# Patient Record
Sex: Male | Born: 1970 | Hispanic: No | Marital: Married | Smoking: Current every day smoker
Health system: Southern US, Community
[De-identification: ages and names within clinical notes are randomized; demographics above are authoritative.]

## PROBLEM LIST (undated history)

## (undated) DIAGNOSIS — I10 Essential (primary) hypertension: Secondary | ICD-10-CM

## (undated) DIAGNOSIS — E785 Hyperlipidemia, unspecified: Secondary | ICD-10-CM

## (undated) DIAGNOSIS — Z720 Tobacco use: Secondary | ICD-10-CM

## (undated) DIAGNOSIS — F418 Other specified anxiety disorders: Secondary | ICD-10-CM

## (undated) DIAGNOSIS — K219 Gastro-esophageal reflux disease without esophagitis: Secondary | ICD-10-CM

## (undated) HISTORY — PX: ANKLE SURGERY: SHX546

## (undated) HISTORY — PX: KNEE SURGERY: SHX244

## (undated) HISTORY — PX: FEMUR FRACTURE SURGERY: SHX633

## (undated) HISTORY — DX: Essential (primary) hypertension: I10

## (undated) HISTORY — DX: Hyperlipidemia, unspecified: E78.5

## (undated) HISTORY — PX: ROTATOR CUFF REPAIR: SHX139

---

## 2007-11-05 ENCOUNTER — Ambulatory Visit (HOSPITAL_BASED_OUTPATIENT_CLINIC_OR_DEPARTMENT_OTHER): Admission: RE | Admit: 2007-11-05 | Discharge: 2007-11-05 | Payer: Self-pay | Admitting: Specialist

## 2011-03-18 NOTE — Op Note (Signed)
NAME:  Elijah Gillespie, Elijah Gillespie NO.:  [id number]   MEDICAL RECORD NO.:  [id number]          PATIENT TYPE:  AMB   LOCATION:  [facility]                         FACILITY:  [facility]   PHYSICIAN:  Jene Every, M.D.    DATE OF BIRTH:  1971-01-04   DATE OF PROCEDURE:  11/05/2007  DATE OF DISCHARGE:                               OPERATIVE REPORT   PREOPERATIVE DIAGNOSIS:  Post-traumatic chondromalacia patellofemoral  joint.   POSTOPERATIVE DIAGNOSIS:  Post-traumatic chondromalacia patellofemoral  joint.  Loose bodies left knee.   SURGEON:  Jene Every, M.D.   PROCEDURE:  Left knee arthroscopy, evacuation of loose bodies,  chondroplasty of patella, partial synovectomy.   BRIEF HISTORY AND INDICATION:  A 40 year old with knee pain following a  work-related injury, persistent pain, swelling and giving way despite  corticosteroid injections, activity modification and physical therapy.  MRI indicating chondromalacia patella.  He was felt to have post-  traumatic chondromalacia, possible meniscus tear.  Operative  intervention was indicated for diagnosis and treatment.  Risks and  benefits discussed including bleeding, infection, no change in symptoms,  worsening of symptoms, need for repeat debridement, knee replacement,  etc.   PROCEDURE IN DETAIL:  With the patient in supine position after  induction of adequate general anesthesia and 2 grams of Kefzol the left  lower extremity was prepped and draped in the usual sterile fashion.  A  lateral parapatellar portal and a superior and medial parapatellar  portal was fashioned with a #11 blade.  Ingress cannula atraumatically  placed.  Irrigant was utilized to insufflate the joint.  The camera was  then inserted laterally and on direct visualization a medial  parapatellar portal was fashioned with a #11 blade after localization  with 18 gauge needle sparing the medial meniscus.   Notable was loose cartilaginous debris and significant  grade III changes  of patella and some grade IV changes in the central portion of it.  There was normal patellofemoral tracking.  The femoral sulcus was  unremarkable.  The knee was lavaged.  I introduced a 4.2 Kuda shaver  through the medial parapatellar portal, performed a chondroplasty of the  patella.  The gutters were unremarkable.  Medial compartment revealed a  normal, essentially some minor grade II changes of the femoral condyle  and the tibial plateau.  There was some mild fraying of the meniscus.  This was shaved.  The remnant was stable to probe palpation.  I examined  the posterior and the anterior aspect on top and below the meniscus and  with probing there was no displaced fragment amenable to further  surgical intervention.  ACL and PCL was unremarkable.  Lateral  compartment revealed normal femoral condyle and tibial plateau and  meniscus stable to probe palpation without evidence of tear.  Again,  loose cartilaginous components were noted within the medial compartment.  This was lavaged.  The gutters were reexamined.  They are unremarkable.  Again, the lateral meniscus was evaluated and it was stable to probe  palpation without evidence of a tear.   Again, the residual menisci were stable to probe palpation without  evidence  of tearing.  Again, the most significant intraoperative finding  was the grade IV changes of the patella and significant grade III  changes and the loose cartilaginous debris from that.   Next, the knee was copiously lavaged.  All instrumentation was removed.  Portals were closed with 4-0 nylon simple sutures, 0.25% Marcaine with  epinephrine was infiltrated in the joint.  The wound was dressed  sterilely.  He was awakened without difficulty and transported to the  recovery room in satisfactory condition.   The patient tolerated the procedure with no complications.      Jene Every, M.D.  Electronically Signed     JB/MEDQ  D:   11/05/2007  T:  11/05/2007  Job:  [id number]

## 2011-07-24 LAB — POCT HEMOGLOBIN-HEMACUE: Hemoglobin: 15.2

## 2012-11-08 ENCOUNTER — Ambulatory Visit: Payer: Self-pay | Attending: Orthopedic Surgery | Admitting: Physical Therapy

## 2012-11-08 DIAGNOSIS — IMO0001 Reserved for inherently not codable concepts without codable children: Secondary | ICD-10-CM | POA: Insufficient documentation

## 2012-11-08 DIAGNOSIS — M25519 Pain in unspecified shoulder: Secondary | ICD-10-CM | POA: Insufficient documentation

## 2012-11-11 ENCOUNTER — Ambulatory Visit: Payer: Self-pay | Attending: Orthopedic Surgery | Admitting: Physical Therapy

## 2012-11-11 DIAGNOSIS — IMO0001 Reserved for inherently not codable concepts without codable children: Secondary | ICD-10-CM | POA: Insufficient documentation

## 2012-11-11 DIAGNOSIS — M25519 Pain in unspecified shoulder: Secondary | ICD-10-CM | POA: Insufficient documentation

## 2012-11-16 ENCOUNTER — Ambulatory Visit: Payer: Self-pay | Admitting: Physical Therapy

## 2012-11-18 ENCOUNTER — Ambulatory Visit: Payer: Self-pay | Admitting: Physical Therapy

## 2012-11-25 ENCOUNTER — Ambulatory Visit: Payer: Self-pay | Admitting: Physical Therapy

## 2012-11-30 ENCOUNTER — Ambulatory Visit: Payer: No Typology Code available for payment source | Admitting: Physical Therapy

## 2012-12-02 ENCOUNTER — Ambulatory Visit: Payer: Self-pay | Admitting: Physical Therapy

## 2012-12-07 ENCOUNTER — Ambulatory Visit: Payer: No Typology Code available for payment source | Admitting: Physical Therapy

## 2012-12-09 ENCOUNTER — Ambulatory Visit: Payer: Self-pay | Attending: Orthopedic Surgery | Admitting: Physical Therapy

## 2012-12-09 DIAGNOSIS — M25519 Pain in unspecified shoulder: Secondary | ICD-10-CM | POA: Insufficient documentation

## 2012-12-09 DIAGNOSIS — IMO0001 Reserved for inherently not codable concepts without codable children: Secondary | ICD-10-CM | POA: Insufficient documentation

## 2012-12-13 ENCOUNTER — Ambulatory Visit: Payer: Self-pay | Admitting: Physical Therapy

## 2013-01-24 ENCOUNTER — Ambulatory Visit: Payer: No Typology Code available for payment source | Attending: Orthopedic Surgery | Admitting: Physical Therapy

## 2013-07-19 ENCOUNTER — Encounter: Payer: Self-pay | Admitting: Vascular Surgery

## 2013-07-20 ENCOUNTER — Encounter (INDEPENDENT_AMBULATORY_CARE_PROVIDER_SITE_OTHER): Payer: BC Managed Care – PPO | Admitting: Vascular Surgery

## 2013-07-20 ENCOUNTER — Encounter: Payer: Self-pay | Admitting: Vascular Surgery

## 2013-07-20 ENCOUNTER — Ambulatory Visit (INDEPENDENT_AMBULATORY_CARE_PROVIDER_SITE_OTHER): Payer: BC Managed Care – PPO | Admitting: Vascular Surgery

## 2013-07-20 VITALS — BP 112/76 | HR 99 | Ht 75.0 in | Wt 283.4 lb

## 2013-07-20 DIAGNOSIS — I83893 Varicose veins of bilateral lower extremities with other complications: Secondary | ICD-10-CM

## 2013-07-20 DIAGNOSIS — M7989 Other specified soft tissue disorders: Secondary | ICD-10-CM

## 2013-07-20 DIAGNOSIS — M79609 Pain in unspecified limb: Secondary | ICD-10-CM

## 2013-07-20 DIAGNOSIS — I803 Phlebitis and thrombophlebitis of lower extremities, unspecified: Secondary | ICD-10-CM

## 2013-07-20 NOTE — Progress Notes (Signed)
[facility]ist of [facility]  Patient name: Elijah Gillespie MRN: [id number] DOB: [birth year] Sex: male  REASON FOR CONSULT: bilateral lower extremity varicose veins.  HPI: Elijah Gillespie is a 42 y.o. 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 He experiences aching pain, swelling, cramps, and throbbing pain in both lower extremities. His symptoms are worsened when he is standing. He stands at work for 8 hours a day. His symptoms are relieved somewhat with elevation. He has not tried compression stockings before. He is unaware of any previous history of DVT or phlebitis. There is no family history of varicose veins that he is aware of.  Past Medical History  Diagnosis Date  . Hyperlipidemia   . Hypertension    History reviewed. No pertinent family history.  SOCIAL HISTORY: History  Substance Use Topics  . Smoking status: Current Every Day Smoker -- 1.00 packs/day    Types: Cigarettes  . Smokeless tobacco: Never Used  . Alcohol Use: 4.8 oz/week    4 Shots of liquor, 4 Cans of beer per week   No Known Allergies  Current Outpatient Prescriptions  Medication Sig Dispense Refill  . ALPRAZolam (XANAX) 1 MG tablet Take 1 mg by mouth 2 (two) times daily.      . citalopram (CELEXA) 20 MG tablet Take 20 mg by mouth daily.      Marland Kitchen HYDROCHLOROTHIAZIDE PO Take 20 mg by mouth daily.      Marland Kitchen lisinopril (PRINIVIL,ZESTRIL) 20 MG tablet Take 20 mg by mouth daily.      Marland Kitchen omeprazole (PRILOSEC) 20 MG capsule Take 20 mg by mouth 2 (two) times daily.       No current facility-administered medications for this visit.   REVIEW OF SYSTEMS: Arly.Keller ] denotes positive finding; [  ] denotes negative finding  CARDIOVASCULAR:  [ ]  chest pain   [ ]  chest pressure   Arly.Keller ] palpitations   [ ]  orthopnea   Arly.Keller ] dyspnea on exertion   [ ]  claudication   [ ]  rest pain   [ ]  DVT   [ ]  phlebitis PULMONARY:   Arly.Keller ] productive cough   [ ]  asthma   [ ]  wheezing NEUROLOGIC:   [ ]  weakness  [  ] paresthesias  [ ]  aphasia  [ ]  amaurosis  [ ]  dizziness HEMATOLOGIC:   [ ]  bleeding problems   [ ]  clotting disorders MUSCULOSKELETAL:  [ ]  joint pain   [ ]  joint swelling [ ]  leg swelling GASTROINTESTINAL: [ ]   blood in stool  [ ]   hematemesis GENITOURINARY:  [ ]   dysuria  [ ]   hematuria PSYCHIATRIC:  [ ]  history of major depression INTEGUMENTARY:  [ ]  rashes  [ ]  ulcers CONSTITUTIONAL:  [ ]  fever   [ ]  chills  PHYSICAL EXAM: Filed Vitals:   07/20/13 1544  BP: 112/76  Pulse: 99  Height: 6\' 3"  (1.905 m)  Weight: 283 lb 6.4 oz (128.549 kg)  SpO2: 96%   Body mass index is 35.42 kg/(m^2). GENERAL: The patient is a well-nourished male, in no acute distress. The vital signs are documented above. CARDIOVASCULAR: There is a regular rate and rhythm. I do not detect carotid bruits. He has palpable femoral, popliteal, and pedal pulses bilaterally. He has no significant lower extremity swelling. PULMONARY: There is good air exchange bilaterally without wheezing or rales. ABDOMEN: Soft and non-tender with normal pitched bowel sounds.  MUSCULOSKELETAL: There are no major deformities or cyanosis. NEUROLOGIC:  No focal weakness or paresthesias are detected. SKIN: He has some truncal varicosities especially along the medial aspect of his right leg. He has smaller varicosities along the medial aspect of his left leg. There is no significant hyperpigmentation. PSYCHIATRIC: The patient has a normal affect.  DATA:  I have independently interpreted his venous duplex scan. On the left side, he has no evidence of DVT. He does have deep vein reflux in the superficial femoral vein and popliteal vein. He also has reflux in the left greater saphenous vein.  On the right side, he has no evidence of DVT. He does have reflux in the right femoral vein, superficial femoral vein, and popliteal vein. In addition he has reflux in the right greater saphenous vein.  MEDICAL ISSUES: This patient has painful varicose  veins of both lower extremities. He has evidence of deep and superficial venous reflux. We have discussed the importance of intermittent leg elevation and the proper positioning for this. In addition we have discussed the use of compression stockings and I have written him a prescription for 8 knee-high compression stockings with a gradient of 15-20 mm Hg. I had suggested using a thigh-high stocking with a gradient of 20-30 mm of mercury, however, the patient did not want to wear those. I also discussed the option of considering laser ablation of the greater saphenous veins however again he is not interested at this time. I've explained that we'll be happy to see him back at any time if his symptoms progress. In addition I discussed with him the importance of tobacco cessation. I have given him the number for [employer] quit now program.   Vendetta Pittinger S [facility] [employer]: [phone]

## 2017-08-23 ENCOUNTER — Emergency Department (HOSPITAL_BASED_OUTPATIENT_CLINIC_OR_DEPARTMENT_OTHER): Payer: Self-pay

## 2017-08-23 ENCOUNTER — Encounter (HOSPITAL_BASED_OUTPATIENT_CLINIC_OR_DEPARTMENT_OTHER): Payer: Self-pay | Admitting: Emergency Medicine

## 2017-08-23 ENCOUNTER — Emergency Department (HOSPITAL_BASED_OUTPATIENT_CLINIC_OR_DEPARTMENT_OTHER)
Admission: EM | Admit: 2017-08-23 | Discharge: 2017-08-23 | Disposition: A | Discharge status: Home / Self Care | Payer: Self-pay | Attending: Emergency Medicine | Admitting: Emergency Medicine

## 2017-08-23 DIAGNOSIS — W231XXA Caught, crushed, jammed, or pinched between stationary objects, initial encounter: Secondary | ICD-10-CM | POA: Insufficient documentation

## 2017-08-23 DIAGNOSIS — I10 Essential (primary) hypertension: Secondary | ICD-10-CM | POA: Insufficient documentation

## 2017-08-23 DIAGNOSIS — R0781 Pleurodynia: Secondary | ICD-10-CM

## 2017-08-23 DIAGNOSIS — Y929 Unspecified place or not applicable: Secondary | ICD-10-CM | POA: Insufficient documentation

## 2017-08-23 DIAGNOSIS — R0789 Other chest pain: Secondary | ICD-10-CM | POA: Insufficient documentation

## 2017-08-23 DIAGNOSIS — F17210 Nicotine dependence, cigarettes, uncomplicated: Secondary | ICD-10-CM | POA: Insufficient documentation

## 2017-08-23 DIAGNOSIS — Y999 Unspecified external cause status: Secondary | ICD-10-CM | POA: Insufficient documentation

## 2017-08-23 DIAGNOSIS — S2221XA Fracture of manubrium, initial encounter for closed fracture: Secondary | ICD-10-CM | POA: Insufficient documentation

## 2017-08-23 DIAGNOSIS — Y939 Activity, unspecified: Secondary | ICD-10-CM | POA: Insufficient documentation

## 2017-08-23 LAB — BASIC METABOLIC PANEL
Anion gap: 8 (ref 5–15)
BUN: 13 mg/dL (ref 6–20)
CHLORIDE: 103 mmol/L (ref 101–111)
CO2: 26 mmol/L (ref 22–32)
Calcium: 9.1 mg/dL (ref 8.9–10.3)
Creatinine, Ser: 0.97 mg/dL (ref 0.61–1.24)
GFR calc Af Amer: >60 mL/min (ref >60)
GFR calc non Af Amer: >60 mL/min (ref >60)
GLUCOSE: 91 mg/dL (ref 65–99)
POTASSIUM: 3.9 mmol/L (ref 3.5–5.1)
Sodium: 137 mmol/L (ref 135–145)

## 2017-08-23 MED ORDER — DIAZEPAM 5 MG PO TABS
5.0000 mg | ORAL_TABLET | Freq: Two times a day (BID) | ORAL | 0 refills | Status: DC
Start: 2017-08-23 — End: 2017-11-09

## 2017-08-23 MED ORDER — HYDROMORPHONE HCL 1 MG/ML IJ SOLN
1.0000 mg | Freq: Once | INTRAMUSCULAR | Status: AC
  Administered 2017-08-23: 1 mg via INTRAVENOUS
  Filled 2017-08-23: qty 1

## 2017-08-23 MED ORDER — MORPHINE SULFATE (PF) 4 MG/ML IV SOLN
4.0000 mg | Freq: Once | INTRAVENOUS | Status: AC
  Administered 2017-08-23: 4 mg via INTRAVENOUS
  Filled 2017-08-23: qty 1

## 2017-08-23 MED ORDER — IOPAMIDOL (ISOVUE-300) INJECTION 61%
100.0000 mL | Freq: Once | INTRAVENOUS | Status: AC | PRN
  Administered 2017-08-23: 80 mL via INTRAVENOUS

## 2017-08-23 NOTE — ED Triage Notes (Signed)
PT presents with c/o left side of trunk pain since Friday he was changing oil in car and jack moved . PT went to other hospital and given motrin and muscle relaxer but still has pain. PT also has xrays at other hospital.

## 2017-08-23 NOTE — ED Provider Notes (Signed)
[facility] EMERGENCY DEPARTMENT Provider Note   CSN: [id number] Arrival date & time: 08/23/17  1511     History   Chief Complaint Chief Complaint  Patient presents with  . Flank Pain    HPI Elijah Gillespie is a 46 y.o. male.  HPI   46 year old male presents today with complaints of chest wall and back pain.  Patient notes that 2 days ago he was changing oil on a vehicle, the Ree Kida moved in the vehicle pinched him up against the wall.  He notes that struck him on the left upper extremity.  Patient notes at that time he was having left anterior chest wall and clavicular pain, and left posterior rib pain.  Patient notes no shortness of breath or productive cough, he does note pain with deep inspiration.  He denies any other injuries, denies patient was seen in the emergency room after the incident, he had plain films ordered with no significant findings, was given muscle relaxers and discharge.  He notes the muscle relaxers have not provided significant improvement in his symptoms.  He notes that he is prescribed Percocet as needed for chronic back pain, has been using this without significant improvement as well.   Past Medical History:  Diagnosis Date  . Hyperlipidemia   . Hypertension     Patient Active Problem List   Diagnosis Date Noted  . Pain in limb 07/20/2013  . Swelling of limb 07/20/2013  . Phlebitis and thrombophlebitis of lower extremities, unspecified 07/20/2013    History reviewed. No pertinent surgical history.     Home Medications    Prior to Admission medications   Medication Sig Start Date End Date Taking? Authorizing Provider  ALPRAZolam Prudy Feeler) 1 MG tablet Take 1 mg by mouth 2 (two) times daily.    [provider]  citalopram (CELEXA) 20 MG tablet Take 20 mg by mouth daily.    [provider]  diazepam (VALIUM) 5 MG tablet Take 1 tablet (5 mg total) by mouth 2 (two) times daily. 08/23/17   Damiyah Ditmars, Tinnie Gens, PA-C    HYDROCHLOROTHIAZIDE PO Take 20 mg by mouth daily.    [provider]  lisinopril (PRINIVIL,ZESTRIL) 20 MG tablet Take 20 mg by mouth daily.    [provider]  omeprazole (PRILOSEC) 20 MG capsule Take 20 mg by mouth 2 (two) times daily.    [provider]    Family History No family history on file.  Social History Social History  Substance Use Topics  . Smoking status: Current Every Day Smoker    Packs/day: 1.00    Types: Cigarettes  . Smokeless tobacco: Never Used  . Alcohol use 4.8 oz/week    4 Shots of liquor, 4 Cans of beer per week     Allergies   Patient has no known allergies.   Review of Systems Review of Systems  All other systems reviewed and are negative.    Physical Exam Updated Vital Signs BP (!) 142/102   Pulse (!) 116   Temp 98.1 F (36.7 C) (Oral)   Resp 20   SpO2 96%   Physical Exam  Constitutional: He is oriented to person, place, and time. He appears well-developed and well-nourished.  HENT:  Head: Normocephalic and atraumatic.  Eyes: Pupils are equal, round, and reactive to light. Conjunctivae are normal. Right eye exhibits no discharge. Left eye exhibits no discharge. No scleral icterus.  Neck: Normal range of motion. No JVD present. No tracheal deviation present.  Pulmonary/Chest: Effort  normal. No stridor. No respiratory distress. He has no wheezes. He has no rales. He exhibits tenderness.  TTP of lateral anterior chest wall and sternum- TTP of left posterior ribs, no crepitus- minor swelling noted over medial superior pectoralis   Abdominal: Soft. He exhibits no distension and no mass. There is no tenderness. There is no rebound and no guarding. No hernia.  Neurological: He is alert and oriented to person, place, and time. Coordination normal.  Psychiatric: He has a normal mood and affect. His behavior is normal. Judgment and thought content normal.  Nursing note and vitals reviewed.    ED Treatments / Results   Labs (all labs ordered are listed, but only abnormal results are displayed) Labs Reviewed  BASIC METABOLIC PANEL    EKG  EKG Interpretation None       Radiology Ct Chest W Contrast  Result Date: 08/23/2017 CLINICAL DATA:  Chest trauma, crushing injury EXAM: CT CHEST WITH CONTRAST TECHNIQUE: Multidetector CT imaging of the chest was performed during intravenous contrast administration. CONTRAST:  80mL ISOVUE-300 IOPAMIDOL (ISOVUE-300) INJECTION 61% COMPARISON:  Chest two-view 08/21/2017 FINDINGS: Cardiovascular: Thoracic aorta normal in caliber. Pulmonary artery is normal. Heart size normal. Possible minimal pericardial effusion. Stranding in the pericardial fat anteriorly on the right may be due to contusion. Mediastinum/Nodes: Large hiatal hernia. Large mass involving the right thyroid measuring 31 x 46 mm. Left lobe of the thyroid normal Lungs/Pleura: Bibasilar atelectasis, right greater than left. Negative for pleural effusion or pneumothorax 10 mm irregular nodule left upper lobe posteriorly. No other lung nodules. Upper Abdomen: Negative Musculoskeletal: Fracture of the superior manubrium on the left extending into the sternoclavicular joint. No rib fracture. Negative for thoracic spine fracture. IMPRESSION: Fracture the superior manubrium on the left extending into the sternoclavicular joint. Possible small pericardial effusion. Mild stranding in the epicardial fat on the right possibly due to contusion. 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 Recommend thyroid ultrasound and possible biopsy to exclude neoplasm Bibasilar atelectasis 10 mm nodule superior segment left upper lobe Consider one of the following in 3 months for both low-risk and high-risk individuals: (a) repeat chest CT, (b) follow-up PET-CT, or (c) tissue sampling. This recommendation follows the consensus statement: Guidelines for Management of Incidental Pulmonary Nodules Detected on CT Images: From the Fleischner  Society 2017; Radiology 2017; 284:228-243. Electronically Signed   By: Marlan Palauharles  Clark M.D.   On: 08/23/2017 17:09    Procedures Procedures (including critical care time)  Medications Ordered in ED Medications  morphine 4 MG/ML injection 4 mg (4 mg Intravenous Given 08/23/17 1615)  iopamidol (ISOVUE-300) 61 % injection 100 mL (80 mLs Intravenous Contrast Given 08/23/17 1623)  HYDROmorphone (DILAUDID) injection 1 mg (1 mg Intravenous Given 08/23/17 1731)     Initial Impression / Assessment and Plan / ED Course  I have reviewed the triage vital signs and the nursing notes.  Pertinent labs & imaging results that were available during my care of the patient were reviewed by me and considered in my medical decision making (see chart for details).     Final Clinical Impressions(s) / ED Diagnoses   Final diagnoses:  Chest wall pain  Rib pain  Closed fracture of manubrium, initial encounter    Labs: I-STAT Chem-8  Imaging: CT chest with contrast  Consults:  Therapeutics: Feeding  Discharge Meds: valium  Assessment/Plan: 46 year old male presents today with chest trauma.  He has no signs of bruising, no acute respiratory difficulties.  Patient does have tenderness  to palpation.  Due to ongoing discomfort and mechanism of injury CT chest with contrast will be ordered to further assess patient.  He will be given pain medication here.   CT scan showed manubrium fracture-also showed potential small pericardial effusion, mild stranding in the epicardial fat on the right side possibly due to contusion.  Patient has no adventitious heart sounds or lung sounds. Slightly tachycardic likely secondary to pain.  Trauma surgery was consulted who agreed that this far out is unlikely for any further findings to appear and that patient would be okay for outpatient follow-up.  EKG will be performed here, patient will be given another dose of pain medicine and likely discharged home.  Patient reports  having muscle spasms noting that muscle relaxers given prior did not improve his symptoms.  Short course of Valium will be given, a counseled both the patient and his wife extensively on use of Valium in combination with narcotic pain medication and/or alcohol, they will avoid using these together.  Patient also had several other incidental findings on a CT scan including large thyroid mass all results were read and given to the patient as this is very important he follows up for ultrasound and biopsy and repeat CT scan of his chest.  He verbalized that he would follow up on these findings.  Patient was given strict return precautions, he had no further questions or concerns at time discharge.  New Prescriptions New Prescriptions   DIAZEPAM (VALIUM) 5 MG TABLET    Take 1 tablet (5 mg total) by mouth 2 (two) times daily.     Eyvonne Mechanic, PA-C 08/23/17 1756    Nira Conn, MD 08/24/17 0001

## 2017-08-23 NOTE — Discharge Instructions (Signed)
Please read attached information.  Please use medication only as directed.  Do not use Valium and narcotic pain medication together as these can cause respiratory depression and can be fatal.  Please follow-up with your primary care provider for reassessment, return to the emergency room immediately if you develop any new or worsening signs or symptoms.  Please follow-up with your primary care about CT scan findings including thyroid mass and pulmonary nodule he will need follow-up imaging.

## 2017-09-20 ENCOUNTER — Emergency Department (HOSPITAL_BASED_OUTPATIENT_CLINIC_OR_DEPARTMENT_OTHER)
Admission: EM | Admit: 2017-09-20 | Discharge: 2017-09-21 | Disposition: A | Discharge status: Home / Self Care | Payer: Medicaid Other | Attending: Emergency Medicine | Admitting: Emergency Medicine

## 2017-09-20 ENCOUNTER — Other Ambulatory Visit: Payer: Self-pay

## 2017-09-20 ENCOUNTER — Encounter (HOSPITAL_BASED_OUTPATIENT_CLINIC_OR_DEPARTMENT_OTHER): Payer: Self-pay | Admitting: *Deleted

## 2017-09-20 ENCOUNTER — Emergency Department (HOSPITAL_BASED_OUTPATIENT_CLINIC_OR_DEPARTMENT_OTHER): Payer: Medicaid Other

## 2017-09-20 DIAGNOSIS — R062 Wheezing: Secondary | ICD-10-CM | POA: Diagnosis not present

## 2017-09-20 DIAGNOSIS — Z79899 Other long term (current) drug therapy: Secondary | ICD-10-CM | POA: Diagnosis not present

## 2017-09-20 DIAGNOSIS — I10 Essential (primary) hypertension: Secondary | ICD-10-CM | POA: Insufficient documentation

## 2017-09-20 DIAGNOSIS — R0789 Other chest pain: Secondary | ICD-10-CM | POA: Diagnosis not present

## 2017-09-20 DIAGNOSIS — F17210 Nicotine dependence, cigarettes, uncomplicated: Secondary | ICD-10-CM | POA: Insufficient documentation

## 2017-09-20 DIAGNOSIS — R079 Chest pain, unspecified: Secondary | ICD-10-CM | POA: Diagnosis present

## 2017-09-20 MED ORDER — IPRATROPIUM-ALBUTEROL 0.5-2.5 (3) MG/3ML IN SOLN
3.0000 mL | Freq: Once | RESPIRATORY_TRACT | Status: AC
  Administered 2017-09-20: 3 mL via RESPIRATORY_TRACT
  Filled 2017-09-20: qty 3

## 2017-09-20 MED ORDER — OXYCODONE-ACETAMINOPHEN 5-325 MG PO TABS
2.0000 | ORAL_TABLET | Freq: Once | ORAL | Status: AC
  Administered 2017-09-21: 2 via ORAL
  Filled 2017-09-20: qty 2

## 2017-09-20 NOTE — ED Notes (Signed)
EDP into room 

## 2017-09-20 NOTE — ED Triage Notes (Signed)
Pt seen here for chest injury in October. States over the 2 weeks he has been having pain in his mid chest and under left axilla. C/o feeling sob with walking short distances- states chest is "burning and aching"

## 2017-09-20 NOTE — ED Notes (Addendum)
Alert, NAD, calm, interactive, resps e/u, speaking in clear complete sentences, no dyspnea noted, skin W&D, VSS, c/o semi-productive cough, congestion, lungs burning, sob/DOE, ongoing for ~ 2 weeks, (denies: fever, sore throat, ear or eye sx, NVD, bleeding). [facility]  L axillary ribs hurt to cough and take deep breath ("in the area of injury ~4 weeks ago"). Tonsils swollen, PND noted.
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
## 2017-09-20 NOTE — ED Notes (Signed)
RT at BS, neb in progress.  

## 2017-09-20 NOTE — ED Notes (Signed)
Ambulatory back from xray, no changes, alert, NAD, calm, interactive. No increased dyspnea.

## 2017-09-21 MED ORDER — ALBUTEROL SULFATE HFA 108 (90 BASE) MCG/ACT IN AERS
2.0000 | INHALATION_SPRAY | Freq: Once | RESPIRATORY_TRACT | Status: AC
  Administered 2017-09-21: 2 via RESPIRATORY_TRACT
  Filled 2017-09-21: qty 6.7

## 2017-09-21 MED ORDER — NAPROXEN 500 MG PO TABS
500.0000 mg | ORAL_TABLET | Freq: Two times a day (BID) | ORAL | 0 refills | Status: DC
Start: 2017-09-21 — End: 2017-11-09

## 2017-09-21 MED ORDER — KETOROLAC TROMETHAMINE 30 MG/ML IJ SOLN
30.0000 mg | Freq: Once | INTRAMUSCULAR | Status: AC
Start: 2017-09-21 — End: 2017-09-21
  Administered 2017-09-21: 30 mg via INTRAMUSCULAR
  Filled 2017-09-21: qty 1

## 2017-09-21 NOTE — ED Notes (Signed)
EDP into room to discuss results and d/c plan

## 2017-09-21 NOTE — Discharge Instructions (Signed)
Seen today for chest wall pain.  This is likely related to your known injury.  This can take 6-8 weeks to heal.  Make sure to use your incentive spirometer 4 times a day.  Avoid smoking.  You will be given an inhaler.

## 2017-09-21 NOTE — ED Notes (Signed)
No changes, updated, VSS. Resting, alert, NAD, calm, interactive. No dyspnea noted at rest.

## 2017-10-27 ENCOUNTER — Other Ambulatory Visit: Payer: Self-pay

## 2017-10-27 ENCOUNTER — Emergency Department (HOSPITAL_BASED_OUTPATIENT_CLINIC_OR_DEPARTMENT_OTHER): Payer: Medicaid Other

## 2017-10-27 ENCOUNTER — Encounter (HOSPITAL_BASED_OUTPATIENT_CLINIC_OR_DEPARTMENT_OTHER): Payer: Self-pay | Admitting: Adult Health

## 2017-10-27 ENCOUNTER — Inpatient Hospital Stay (HOSPITAL_BASED_OUTPATIENT_CLINIC_OR_DEPARTMENT_OTHER)
Admission: EM | Admit: 2017-10-27 | Discharge: 2017-11-09 | DRG: 853 | Disposition: A | Discharge status: Home / Self Care | Payer: Medicaid Other | Attending: Internal Medicine | Admitting: Internal Medicine

## 2017-10-27 DIAGNOSIS — Z4659 Encounter for fitting and adjustment of other gastrointestinal appliance and device: Secondary | ICD-10-CM

## 2017-10-27 DIAGNOSIS — Z09 Encounter for follow-up examination after completed treatment for conditions other than malignant neoplasm: Secondary | ICD-10-CM

## 2017-10-27 DIAGNOSIS — Z716 Tobacco abuse counseling: Secondary | ICD-10-CM

## 2017-10-27 DIAGNOSIS — J90 Pleural effusion, not elsewhere classified: Secondary | ICD-10-CM

## 2017-10-27 DIAGNOSIS — Z01811 Encounter for preprocedural respiratory examination: Secondary | ICD-10-CM

## 2017-10-27 DIAGNOSIS — I10 Essential (primary) hypertension: Secondary | ICD-10-CM

## 2017-10-27 DIAGNOSIS — Z720 Tobacco use: Secondary | ICD-10-CM | POA: Diagnosis present

## 2017-10-27 DIAGNOSIS — R6520 Severe sepsis without septic shock: Secondary | ICD-10-CM | POA: Diagnosis present

## 2017-10-27 DIAGNOSIS — R000 Tachycardia, unspecified: Secondary | ICD-10-CM

## 2017-10-27 DIAGNOSIS — Z79899 Other long term (current) drug therapy: Secondary | ICD-10-CM

## 2017-10-27 DIAGNOSIS — Z833 Family history of diabetes mellitus: Secondary | ICD-10-CM

## 2017-10-27 DIAGNOSIS — J939 Pneumothorax, unspecified: Secondary | ICD-10-CM

## 2017-10-27 DIAGNOSIS — E876 Hypokalemia: Secondary | ICD-10-CM | POA: Diagnosis present

## 2017-10-27 DIAGNOSIS — Z8672 Personal history of thrombophlebitis: Secondary | ICD-10-CM

## 2017-10-27 DIAGNOSIS — J189 Pneumonia, unspecified organism: Secondary | ICD-10-CM

## 2017-10-27 DIAGNOSIS — A419 Sepsis, unspecified organism: Secondary | ICD-10-CM

## 2017-10-27 DIAGNOSIS — F418 Other specified anxiety disorders: Secondary | ICD-10-CM | POA: Diagnosis present

## 2017-10-27 DIAGNOSIS — Z803 Family history of malignant neoplasm of breast: Secondary | ICD-10-CM

## 2017-10-27 DIAGNOSIS — Z9289 Personal history of other medical treatment: Secondary | ICD-10-CM

## 2017-10-27 DIAGNOSIS — M62838 Other muscle spasm: Secondary | ICD-10-CM | POA: Diagnosis present

## 2017-10-27 DIAGNOSIS — J9811 Atelectasis: Secondary | ICD-10-CM

## 2017-10-27 DIAGNOSIS — E785 Hyperlipidemia, unspecified: Secondary | ICD-10-CM

## 2017-10-27 DIAGNOSIS — Z9889 Other specified postprocedural states: Secondary | ICD-10-CM

## 2017-10-27 DIAGNOSIS — D638 Anemia in other chronic diseases classified elsewhere: Secondary | ICD-10-CM | POA: Diagnosis present

## 2017-10-27 DIAGNOSIS — D62 Acute posthemorrhagic anemia: Secondary | ICD-10-CM | POA: Diagnosis not present

## 2017-10-27 DIAGNOSIS — Z938 Other artificial opening status: Secondary | ICD-10-CM

## 2017-10-27 DIAGNOSIS — J9601 Acute respiratory failure with hypoxia: Secondary | ICD-10-CM | POA: Diagnosis present

## 2017-10-27 DIAGNOSIS — Z9689 Presence of other specified functional implants: Secondary | ICD-10-CM

## 2017-10-27 DIAGNOSIS — R739 Hyperglycemia, unspecified: Secondary | ICD-10-CM | POA: Diagnosis not present

## 2017-10-27 DIAGNOSIS — J181 Lobar pneumonia, unspecified organism: Secondary | ICD-10-CM

## 2017-10-27 DIAGNOSIS — A408 Other streptococcal sepsis: Principal | ICD-10-CM | POA: Diagnosis present

## 2017-10-27 DIAGNOSIS — E041 Nontoxic single thyroid nodule: Secondary | ICD-10-CM

## 2017-10-27 DIAGNOSIS — F17210 Nicotine dependence, cigarettes, uncomplicated: Secondary | ICD-10-CM | POA: Diagnosis present

## 2017-10-27 DIAGNOSIS — J869 Pyothorax without fistula: Secondary | ICD-10-CM

## 2017-10-27 DIAGNOSIS — Z6835 Body mass index (BMI) 35.0-35.9, adult: Secondary | ICD-10-CM

## 2017-10-27 DIAGNOSIS — B9789 Other viral agents as the cause of diseases classified elsewhere: Secondary | ICD-10-CM | POA: Diagnosis present

## 2017-10-27 DIAGNOSIS — K219 Gastro-esophageal reflux disease without esophagitis: Secondary | ICD-10-CM

## 2017-10-27 HISTORY — DX: Gastro-esophageal reflux disease without esophagitis: K21.9

## 2017-10-27 HISTORY — DX: Tobacco use: Z72.0

## 2017-10-27 HISTORY — DX: Other specified anxiety disorders: F41.8

## 2017-10-27 LAB — BASIC METABOLIC PANEL
ANION GAP: 9 (ref 5–15)
BUN: 11 mg/dL (ref 6–20)
CALCIUM: 8.9 mg/dL (ref 8.9–10.3)
CO2: 29 mmol/L (ref 22–32)
CREATININE: 0.96 mg/dL (ref 0.61–1.24)
Chloride: 101 mmol/L (ref 101–111)
Glucose, Bld: 148 mg/dL — ABNORMAL HIGH (ref 65–99)
Potassium: 3.8 mmol/L (ref 3.5–5.1)
SODIUM: 139 mmol/L (ref 135–145)

## 2017-10-27 LAB — I-STAT CG4 LACTIC ACID, ED: LACTIC ACID, VENOUS: 1.67 mmol/L (ref 0.5–1.9)

## 2017-10-27 NOTE — ED Notes (Signed)
Pt given swabs for dry mouth; requesting something to drink; told he could not have anything to eat/drink until further notice.

## 2017-10-27 NOTE — ED Triage Notes (Signed)
PResents with SOB that began 2 weeks ago and has been intermittent.  HE endorses left side pain Endorses cough, coughing up yellowish brown. He has been seen here a few times in the past three months for similar episodes. HE has been using incentive spirometer at home without being able to raise the meter as high as he was able to before today. PT is tachypneic and tachycardic. Diminished on left with rales.

## 2017-10-27 NOTE — ED Notes (Signed)
Family at bedside. 

## 2017-10-27 NOTE — ED Notes (Signed)
ED Provider at bedside. 

## 2017-10-28 ENCOUNTER — Emergency Department (HOSPITAL_BASED_OUTPATIENT_CLINIC_OR_DEPARTMENT_OTHER): Payer: Medicaid Other

## 2017-10-28 ENCOUNTER — Inpatient Hospital Stay (HOSPITAL_COMMUNITY): Payer: Medicaid Other

## 2017-10-28 ENCOUNTER — Encounter (HOSPITAL_BASED_OUTPATIENT_CLINIC_OR_DEPARTMENT_OTHER): Payer: Self-pay | Admitting: Internal Medicine

## 2017-10-28 ENCOUNTER — Other Ambulatory Visit: Payer: Self-pay

## 2017-10-28 DIAGNOSIS — F17210 Nicotine dependence, cigarettes, uncomplicated: Secondary | ICD-10-CM | POA: Diagnosis present

## 2017-10-28 DIAGNOSIS — Z833 Family history of diabetes mellitus: Secondary | ICD-10-CM | POA: Diagnosis not present

## 2017-10-28 DIAGNOSIS — A419 Sepsis, unspecified organism: Secondary | ICD-10-CM | POA: Diagnosis not present

## 2017-10-28 DIAGNOSIS — R000 Tachycardia, unspecified: Secondary | ICD-10-CM | POA: Diagnosis present

## 2017-10-28 DIAGNOSIS — D638 Anemia in other chronic diseases classified elsewhere: Secondary | ICD-10-CM | POA: Diagnosis present

## 2017-10-28 DIAGNOSIS — K219 Gastro-esophageal reflux disease without esophagitis: Secondary | ICD-10-CM | POA: Diagnosis present

## 2017-10-28 DIAGNOSIS — Z720 Tobacco use: Secondary | ICD-10-CM | POA: Diagnosis not present

## 2017-10-28 DIAGNOSIS — J90 Pleural effusion, not elsewhere classified: Secondary | ICD-10-CM

## 2017-10-28 DIAGNOSIS — J9601 Acute respiratory failure with hypoxia: Secondary | ICD-10-CM

## 2017-10-28 DIAGNOSIS — E785 Hyperlipidemia, unspecified: Secondary | ICD-10-CM

## 2017-10-28 DIAGNOSIS — Z803 Family history of malignant neoplasm of breast: Secondary | ICD-10-CM | POA: Diagnosis not present

## 2017-10-28 DIAGNOSIS — Z8672 Personal history of thrombophlebitis: Secondary | ICD-10-CM | POA: Diagnosis not present

## 2017-10-28 DIAGNOSIS — E041 Nontoxic single thyroid nodule: Secondary | ICD-10-CM

## 2017-10-28 DIAGNOSIS — R0602 Shortness of breath: Secondary | ICD-10-CM | POA: Diagnosis present

## 2017-10-28 DIAGNOSIS — I10 Essential (primary) hypertension: Secondary | ICD-10-CM

## 2017-10-28 DIAGNOSIS — Z938 Other artificial opening status: Secondary | ICD-10-CM | POA: Diagnosis not present

## 2017-10-28 DIAGNOSIS — B9789 Other viral agents as the cause of diseases classified elsewhere: Secondary | ICD-10-CM | POA: Diagnosis present

## 2017-10-28 DIAGNOSIS — Z716 Tobacco abuse counseling: Secondary | ICD-10-CM | POA: Diagnosis not present

## 2017-10-28 DIAGNOSIS — D62 Acute posthemorrhagic anemia: Secondary | ICD-10-CM | POA: Diagnosis not present

## 2017-10-28 DIAGNOSIS — Z6835 Body mass index (BMI) 35.0-35.9, adult: Secondary | ICD-10-CM | POA: Diagnosis not present

## 2017-10-28 DIAGNOSIS — J869 Pyothorax without fistula: Secondary | ICD-10-CM | POA: Diagnosis present

## 2017-10-28 DIAGNOSIS — E7849 Other hyperlipidemia: Secondary | ICD-10-CM | POA: Diagnosis not present

## 2017-10-28 DIAGNOSIS — A408 Other streptococcal sepsis: Secondary | ICD-10-CM | POA: Diagnosis present

## 2017-10-28 DIAGNOSIS — J181 Lobar pneumonia, unspecified organism: Secondary | ICD-10-CM | POA: Diagnosis present

## 2017-10-28 DIAGNOSIS — E876 Hypokalemia: Secondary | ICD-10-CM | POA: Diagnosis present

## 2017-10-28 DIAGNOSIS — J189 Pneumonia, unspecified organism: Secondary | ICD-10-CM | POA: Diagnosis not present

## 2017-10-28 DIAGNOSIS — R6520 Severe sepsis without septic shock: Secondary | ICD-10-CM | POA: Diagnosis present

## 2017-10-28 DIAGNOSIS — F418 Other specified anxiety disorders: Secondary | ICD-10-CM | POA: Diagnosis present

## 2017-10-28 DIAGNOSIS — J9811 Atelectasis: Secondary | ICD-10-CM | POA: Diagnosis present

## 2017-10-28 DIAGNOSIS — A491 Streptococcal infection, unspecified site: Secondary | ICD-10-CM | POA: Diagnosis not present

## 2017-10-28 DIAGNOSIS — R739 Hyperglycemia, unspecified: Secondary | ICD-10-CM | POA: Diagnosis not present

## 2017-10-28 HISTORY — PX: IR THORACENTESIS ASP PLEURAL SPACE W/IMG GUIDE: IMG5380

## 2017-10-28 LAB — GRAM STAIN

## 2017-10-28 LAB — CBC
HCT: 36.8 % — ABNORMAL LOW (ref 39.0–52.0)
Hemoglobin: 11.6 g/dL — ABNORMAL LOW (ref 13.0–17.0)
MCH: 27.4 pg (ref 26.0–34.0)
MCHC: 31.5 g/dL (ref 30.0–36.0)
MCV: 86.8 fL (ref 78.0–100.0)
Platelets: 359 10*3/uL (ref 150–400)
RBC: 4.24 MIL/uL (ref 4.22–5.81)
RDW: 15.2 % (ref 11.5–15.5)
WBC: 35.9 10*3/uL — ABNORMAL HIGH (ref 4.0–10.5)

## 2017-10-28 LAB — RESPIRATORY PANEL BY PCR
Adenovirus: NOT DETECTED
BORDETELLA PERTUSSIS-RVPCR: NOT DETECTED
CORONAVIRUS 229E-RVPPCR: NOT DETECTED
Chlamydophila pneumoniae: NOT DETECTED
Coronavirus HKU1: NOT DETECTED
Coronavirus NL63: NOT DETECTED
Coronavirus OC43: NOT DETECTED
INFLUENZA B-RVPPCR: NOT DETECTED
Influenza A: NOT DETECTED
METAPNEUMOVIRUS-RVPPCR: NOT DETECTED
MYCOPLASMA PNEUMONIAE-RVPPCR: NOT DETECTED
PARAINFLUENZA VIRUS 2-RVPPCR: NOT DETECTED
Parainfluenza Virus 1: NOT DETECTED
Parainfluenza Virus 3: NOT DETECTED
Parainfluenza Virus 4: NOT DETECTED
RESPIRATORY SYNCYTIAL VIRUS-RVPPCR: NOT DETECTED
Rhinovirus / Enterovirus: NOT DETECTED

## 2017-10-28 LAB — BODY FLUID CELL COUNT WITH DIFFERENTIAL
EOS FL: 0 %
Lymphs, Fluid: 8 %
Monocyte-Macrophage-Serous Fluid: 4 % — ABNORMAL LOW (ref 50–90)
Neutrophil Count, Fluid: 88 % — ABNORMAL HIGH (ref 0–25)
Total Nucleated Cell Count, Fluid: 4950 cu mm — ABNORMAL HIGH (ref 0–1000)

## 2017-10-28 LAB — CBC WITH DIFFERENTIAL/PLATELET
BASOS ABS: 0 10*3/uL (ref 0.0–0.1)
BASOS PCT: 0 %
Basophils Absolute: 0 10*3/uL (ref 0.0–0.1)
Basophils Relative: 0 %
EOS ABS: 0.3 10*3/uL (ref 0.0–0.7)
EOS ABS: 0 10*3/uL (ref 0.0–0.7)
Eosinophils Relative: 2 %
Eosinophils Relative: 0 %
HCT: 40.2 % (ref 39.0–52.0)
HCT: 31.7 % — ABNORMAL LOW (ref 39.0–52.0)
HEMOGLOBIN: 10.1 g/dL — AB (ref 13.0–17.0)
Hemoglobin: 12.6 g/dL — ABNORMAL LOW (ref 13.0–17.0)
LYMPHS PCT: 14 %
Lymphocytes Relative: 4 %
Lymphs Abs: 2.4 10*3/uL (ref 0.7–4.0)
Lymphs Abs: 1 10*3/uL (ref 0.7–4.0)
MCH: 27 pg (ref 26.0–34.0)
MCH: 27.2 pg (ref 26.0–34.0)
MCHC: 31.3 g/dL (ref 30.0–36.0)
MCHC: 31.9 g/dL (ref 30.0–36.0)
MCV: 86.3 fL (ref 78.0–100.0)
MCV: 85.2 fL (ref 78.0–100.0)
MONO ABS: 1.8 10*3/uL — AB (ref 0.1–1.0)
MONO ABS: 2.1 10*3/uL — AB (ref 0.1–1.0)
Monocytes Relative: 11 %
Monocytes Relative: 8 %
NEUTROS ABS: 22.9 10*3/uL — AB (ref 1.7–7.7)
NEUTROS PCT: 73 %
Neutro Abs: 12.3 10*3/uL — ABNORMAL HIGH (ref 1.7–7.7)
Neutrophils Relative %: 88 %
PLATELETS: 413 10*3/uL — AB (ref 150–400)
PLATELETS: 305 10*3/uL (ref 150–400)
RBC: 4.66 MIL/uL (ref 4.22–5.81)
RBC: 3.72 MIL/uL — ABNORMAL LOW (ref 4.22–5.81)
RDW: 14.8 % (ref 11.5–15.5)
RDW: 14.7 % (ref 11.5–15.5)
WBC MORPHOLOGY: INCREASED
WBC: 16.8 10*3/uL — AB (ref 4.0–10.5)
WBC: 26 10*3/uL — ABNORMAL HIGH (ref 4.0–10.5)

## 2017-10-28 LAB — MRSA PCR SCREENING: MRSA by PCR: NEGATIVE

## 2017-10-28 LAB — EXPECTORATED SPUTUM ASSESSMENT W GRAM STAIN, RFLX TO RESP C

## 2017-10-28 LAB — LACTATE DEHYDROGENASE, PLEURAL OR PERITONEAL FLUID: LD FL: 2054 U/L — AB (ref 3–23)

## 2017-10-28 LAB — INFLUENZA PANEL BY PCR (TYPE A & B)
INFLAPCR: NEGATIVE
Influenza B By PCR: NEGATIVE

## 2017-10-28 LAB — I-STAT ARTERIAL BLOOD GAS, ED
ACID-BASE EXCESS: 1 mmol/L (ref 0.0–2.0)
Bicarbonate: 25.2 mmol/L (ref 20.0–28.0)
O2 SAT: 96 %
PH ART: 7.427 (ref 7.350–7.450)
Patient temperature: 98.3
TCO2: 26 mmol/L (ref 22–32)
pCO2 arterial: 38.1 mmHg (ref 32.0–48.0)
pO2, Arterial: 81 mmHg — ABNORMAL LOW (ref 83.0–108.0)

## 2017-10-28 LAB — GLUCOSE, CAPILLARY: Glucose-Capillary: 97 mg/dL (ref 65–99)

## 2017-10-28 LAB — HIV ANTIBODY (ROUTINE TESTING W REFLEX): HIV SCREEN 4TH GENERATION: NONREACTIVE

## 2017-10-28 LAB — EXPECTORATED SPUTUM ASSESSMENT W REFEX TO RESP CULTURE

## 2017-10-28 LAB — TYPE AND SCREEN
ABO/RH(D): O POS
ANTIBODY SCREEN: NEGATIVE

## 2017-10-28 LAB — BRAIN NATRIURETIC PEPTIDE: B NATRIURETIC PEPTIDE 5: 20 pg/mL (ref 0.0–100.0)

## 2017-10-28 LAB — PROTIME-INR
INR: 1.24
Prothrombin Time: 15.5 seconds — ABNORMAL HIGH (ref 11.4–15.2)

## 2017-10-28 LAB — GLUCOSE, PLEURAL OR PERITONEAL FLUID: Glucose, Fluid: <20 mg/dL

## 2017-10-28 LAB — LACTIC ACID, PLASMA
LACTIC ACID, VENOUS: 0.6 mmol/L (ref 0.5–1.9)
LACTIC ACID, VENOUS: 0.8 mmol/L (ref 0.5–1.9)

## 2017-10-28 LAB — PROTEIN, PLEURAL OR PERITONEAL FLUID: Total protein, fluid: 4.3 g/dL

## 2017-10-28 LAB — ALBUMIN, PLEURAL OR PERITONEAL FLUID: Albumin, Fluid: 1.7 g/dL

## 2017-10-28 LAB — PROCALCITONIN: Procalcitonin: 1.08 ng/mL

## 2017-10-28 LAB — ABO/RH: ABO/RH(D): O POS

## 2017-10-28 LAB — T4, FREE: FREE T4: 1.24 ng/dL — AB (ref 0.61–1.12)

## 2017-10-28 LAB — TROPONIN I

## 2017-10-28 LAB — APTT: aPTT: 41 seconds — ABNORMAL HIGH (ref 24–36)

## 2017-10-28 MED ORDER — SODIUM CHLORIDE 0.9 % IV SOLN
INTRAVENOUS | Status: AC
  Administered 2017-10-29: 04:00:00 via INTRAVENOUS

## 2017-10-28 MED ORDER — DEXTROSE 5 % IV SOLN
1.0000 g | Freq: Once | INTRAVENOUS | Status: AC
  Administered 2017-10-28: 1 g via INTRAVENOUS
  Filled 2017-10-28: qty 10

## 2017-10-28 MED ORDER — DM-GUAIFENESIN ER 30-600 MG PO TB12
1.0000 | ORAL_TABLET | Freq: Two times a day (BID) | ORAL | Status: DC
  Administered 2017-10-28 – 2017-10-29 (×5): 1 via ORAL
  Filled 2017-10-28 (×6): qty 1

## 2017-10-28 MED ORDER — ACETAMINOPHEN 325 MG PO TABS
650.0000 mg | ORAL_TABLET | Freq: Four times a day (QID) | ORAL | Status: DC | PRN
  Administered 2017-10-28: 650 mg via ORAL
  Filled 2017-10-28: qty 2

## 2017-10-28 MED ORDER — DIAZEPAM 5 MG PO TABS: 5.0000 mg | ORAL_TABLET | Freq: Two times a day (BID) | ORAL | Status: DC

## 2017-10-28 MED ORDER — LIDOCAINE 2% (20 MG/ML) 5 ML SYRINGE
INTRAMUSCULAR | Status: AC
  Filled 2017-10-28: qty 10

## 2017-10-28 MED ORDER — IOPAMIDOL (ISOVUE-370) INJECTION 76%
100.0000 mL | Freq: Once | INTRAVENOUS | Status: AC | PRN
  Administered 2017-10-28: 100 mL via INTRAVENOUS

## 2017-10-28 MED ORDER — OXYCODONE-ACETAMINOPHEN 5-325 MG PO TABS
1.0000 | ORAL_TABLET | Freq: Four times a day (QID) | ORAL | Status: DC | PRN
  Administered 2017-10-28 – 2017-10-29 (×4): 1 via ORAL
  Filled 2017-10-28 (×4): qty 1

## 2017-10-28 MED ORDER — OXYCODONE-ACETAMINOPHEN 5-325 MG PO TABS
1.0000 | ORAL_TABLET | Freq: Four times a day (QID) | ORAL | Status: DC | PRN
  Administered 2017-10-28 (×2): 1 via ORAL
  Filled 2017-10-28 (×2): qty 1

## 2017-10-28 MED ORDER — LIDOCAINE HCL (PF) 1 % IJ SOLN
INTRAMUSCULAR | Status: DC | PRN
  Administered 2017-10-28: 10 mL

## 2017-10-28 MED ORDER — HYDROMORPHONE HCL 1 MG/ML IJ SOLN
1.0000 mg | INTRAMUSCULAR | Status: DC | PRN
  Administered 2017-10-28 (×2): 1 mg via INTRAVENOUS
  Filled 2017-10-28 (×2): qty 1

## 2017-10-28 MED ORDER — SODIUM CHLORIDE 0.9 % IV BOLUS (SEPSIS)
1500.0000 mL | Freq: Once | INTRAVENOUS | Status: AC
  Administered 2017-10-28: 1500 mL via INTRAVENOUS

## 2017-10-28 MED ORDER — FENTANYL CITRATE (PF) 100 MCG/2ML IJ SOLN
50.0000 ug | Freq: Once | INTRAMUSCULAR | Status: AC
  Administered 2017-10-28: 50 ug via INTRAVENOUS

## 2017-10-28 MED ORDER — DEXTROSE 5 % IV SOLN: 1.0000 g | INTRAVENOUS | Status: DC

## 2017-10-28 MED ORDER — LISINOPRIL 20 MG PO TABS: 20.0000 mg | ORAL_TABLET | Freq: Every day | ORAL | Status: DC

## 2017-10-28 MED ORDER — HYDROMORPHONE 1 MG/ML IV SOLN
INTRAVENOUS | Status: DC
  Administered 2017-10-28: 21:00:00 via INTRAVENOUS
  Administered 2017-10-29: 5.4 mg via INTRAVENOUS
  Administered 2017-10-29: 2.4 mg via INTRAVENOUS
  Administered 2017-10-29: 2.1 mg via INTRAVENOUS
  Administered 2017-10-29: 3 mg via INTRAVENOUS
  Administered 2017-10-29: 2 mg via INTRAVENOUS
  Administered 2017-10-30: 1.8 mg via INTRAVENOUS
  Administered 2017-10-30: 5.7 mg via INTRAVENOUS
  Filled 2017-10-28: qty 25

## 2017-10-28 MED ORDER — HYDRALAZINE HCL 20 MG/ML IJ SOLN: 5.0000 mg | INTRAMUSCULAR | Status: DC | PRN

## 2017-10-28 MED ORDER — DIPHENHYDRAMINE HCL 12.5 MG/5ML PO ELIX
12.5000 mg | ORAL_SOLUTION | Freq: Four times a day (QID) | ORAL | Status: DC | PRN
  Filled 2017-10-28: qty 5

## 2017-10-28 MED ORDER — PNEUMOCOCCAL VAC POLYVALENT 25 MCG/0.5ML IJ INJ
0.5000 mL | INJECTION | INTRAMUSCULAR | Status: DC
  Filled 2017-10-28: qty 0.5

## 2017-10-28 MED ORDER — ALPRAZOLAM 0.5 MG PO TABS: 1.0000 mg | ORAL_TABLET | Freq: Two times a day (BID) | ORAL | Status: DC

## 2017-10-28 MED ORDER — ATORVASTATIN CALCIUM 20 MG PO TABS
20.0000 mg | ORAL_TABLET | Freq: Every day | ORAL | Status: DC
  Administered 2017-10-28 – 2017-10-29 (×3): 20 mg via ORAL
  Filled 2017-10-28 (×3): qty 1

## 2017-10-28 MED ORDER — PANTOPRAZOLE SODIUM 40 MG PO TBEC
40.0000 mg | DELAYED_RELEASE_TABLET | Freq: Every day | ORAL | Status: DC
  Administered 2017-10-28 – 2017-10-29 (×2): 40 mg via ORAL
  Filled 2017-10-28 (×2): qty 1

## 2017-10-28 MED ORDER — DIPHENHYDRAMINE HCL 50 MG/ML IJ SOLN: 12.5000 mg | Freq: Four times a day (QID) | INTRAMUSCULAR | Status: DC | PRN

## 2017-10-28 MED ORDER — FENTANYL CITRATE (PF) 100 MCG/2ML IJ SOLN
INTRAMUSCULAR | Status: AC
Start: 2017-10-28 — End: 2017-10-28
  Administered 2017-10-28: 50 ug via INTRAVENOUS
  Filled 2017-10-28: qty 2

## 2017-10-28 MED ORDER — ZOLPIDEM TARTRATE 5 MG PO TABS: 5.0000 mg | ORAL_TABLET | Freq: Every evening | ORAL | Status: DC | PRN

## 2017-10-28 MED ORDER — LACTATED RINGERS IV BOLUS (SEPSIS)
2000.0000 mL | Freq: Once | INTRAVENOUS | Status: AC
  Administered 2017-10-28: 2000 mL via INTRAVENOUS

## 2017-10-28 MED ORDER — ORAL CARE MOUTH RINSE
15.0000 mL | Freq: Two times a day (BID) | OROMUCOSAL | Status: DC
  Administered 2017-10-28 – 2017-10-29 (×4): 15 mL via OROMUCOSAL

## 2017-10-28 MED ORDER — LACTATED RINGERS IV BOLUS (SEPSIS): 3000.0000 mL | Freq: Once | INTRAVENOUS | Status: DC

## 2017-10-28 MED ORDER — ONDANSETRON HCL 4 MG/2ML IJ SOLN: 4.0000 mg | Freq: Three times a day (TID) | INTRAMUSCULAR | Status: DC | PRN

## 2017-10-28 MED ORDER — DEXTROSE 5 % IV SOLN
500.0000 mg | Freq: Every day | INTRAVENOUS | Status: DC
  Administered 2017-10-28: 500 mg via INTRAVENOUS
  Filled 2017-10-28 (×2): qty 500

## 2017-10-28 MED ORDER — CHLORHEXIDINE GLUCONATE 0.12 % MT SOLN
15.0000 mL | Freq: Two times a day (BID) | OROMUCOSAL | Status: DC
  Administered 2017-10-28 – 2017-10-29 (×3): 15 mL via OROMUCOSAL
  Filled 2017-10-28 (×2): qty 15

## 2017-10-28 MED ORDER — SODIUM CHLORIDE 0.9 % IV SOLN
INTRAVENOUS | Status: DC
  Administered 2017-10-28: 08:00:00 via INTRAVENOUS

## 2017-10-28 MED ORDER — LORAZEPAM 2 MG/ML IJ SOLN
1.0000 mg | Freq: Once | INTRAMUSCULAR | Status: AC
  Administered 2017-10-28: 1 mg via INTRAVENOUS
  Filled 2017-10-28: qty 1

## 2017-10-28 MED ORDER — NICOTINE 21 MG/24HR TD PT24
21.0000 mg | MEDICATED_PATCH | Freq: Every day | TRANSDERMAL | Status: DC
  Filled 2017-10-28 (×3): qty 1

## 2017-10-28 MED ORDER — LIDOCAINE HCL (PF) 1 % IJ SOLN
INTRAMUSCULAR | Status: AC
  Filled 2017-10-28: qty 30

## 2017-10-28 MED ORDER — KETOROLAC TROMETHAMINE 15 MG/ML IJ SOLN
15.0000 mg | Freq: Once | INTRAMUSCULAR | Status: AC
Start: 2017-10-28 — End: 2017-10-28
  Administered 2017-10-28: 15 mg via INTRAVENOUS
  Filled 2017-10-28: qty 1

## 2017-10-28 MED ORDER — NALOXONE HCL 0.4 MG/ML IJ SOLN: 0.4000 mg | INTRAMUSCULAR | Status: DC | PRN

## 2017-10-28 MED ORDER — ACETAMINOPHEN 325 MG PO TABS: 650.0000 mg | ORAL_TABLET | Freq: Four times a day (QID) | ORAL | Status: DC | PRN

## 2017-10-28 MED ORDER — SODIUM CHLORIDE 0.9 % IV BOLUS (SEPSIS)
500.0000 mL | Freq: Once | INTRAVENOUS | Status: AC
  Administered 2017-10-28: 500 mL via INTRAVENOUS

## 2017-10-28 MED ORDER — DEXTROSE 5 % IV SOLN
500.0000 mg | Freq: Once | INTRAVENOUS | Status: AC
  Administered 2017-10-28: 500 mg via INTRAVENOUS
  Filled 2017-10-28: qty 500

## 2017-10-28 MED ORDER — DEXTROSE 5 % IV SOLN
1.0000 g | Freq: Every day | INTRAVENOUS | Status: DC
  Administered 2017-10-28: 1 g via INTRAVENOUS
  Filled 2017-10-28: qty 10

## 2017-10-28 MED ORDER — AZITHROMYCIN 500 MG IV SOLR
INTRAVENOUS | Status: AC
  Filled 2017-10-28: qty 500

## 2017-10-28 MED ORDER — KETOROLAC TROMETHAMINE 15 MG/ML IJ SOLN
15.0000 mg | Freq: Three times a day (TID) | INTRAMUSCULAR | Status: DC | PRN
  Administered 2017-10-28: 15 mg via INTRAVENOUS
  Filled 2017-10-28: qty 1

## 2017-10-28 MED ORDER — DEXTROSE 5 % IV SOLN: 500.0000 mg | INTRAVENOUS | Status: DC

## 2017-10-28 MED ORDER — LEVALBUTEROL HCL 0.63 MG/3ML IN NEBU
0.6300 mg | INHALATION_SOLUTION | Freq: Four times a day (QID) | RESPIRATORY_TRACT | Status: DC | PRN
  Administered 2017-10-29: 0.63 mg via RESPIRATORY_TRACT
  Filled 2017-10-28 (×2): qty 3

## 2017-10-28 MED ORDER — SODIUM CHLORIDE 0.9% FLUSH: 9.0000 mL | INTRAVENOUS | Status: DC | PRN

## 2017-10-28 MED ORDER — CITALOPRAM HYDROBROMIDE 20 MG PO TABS
20.0000 mg | ORAL_TABLET | Freq: Every day | ORAL | Status: DC
  Administered 2017-10-28 – 2017-10-29 (×2): 20 mg via ORAL
  Filled 2017-10-28 (×3): qty 1
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
## 2017-10-28 NOTE — Progress Notes (Addendum)
Pharmacy Code Sepsis Protocol  Admitted for work up of sepsis.  On admission - tachycardia, tachypnea, leukocytosis and CT chest suspicious for pneumonia.  Time of code sepsis page: 05:14AM  [x]  Antibiotics administered prior to code at 01:38AM  Anti-infectives (From admission, onward)   Start     Dose/Rate Route Frequency Ordered Stop   10/28/17 2200  azithromycin (ZITHROMAX) 500 mg in dextrose 5 % 250 mL IVPB     500 mg 250 mL/hr over 60 Minutes Intravenous Daily at bedtime 10/28/17 0102     10/28/17 2200  cefTRIAXone (ROCEPHIN) 1 g in dextrose 5 % 50 mL IVPB     1 g 100 mL/hr over 30 Minutes Intravenous Daily at bedtime 10/28/17 0102     10/28/17 0515  cefTRIAXone (ROCEPHIN) 1 g in dextrose 5 % 50 mL IVPB  Status:  Discontinued     1 g 100 mL/hr over 30 Minutes Intravenous Every 24 hours 10/28/17 0514 10/28/17 0518   10/28/17 0515  azithromycin (ZITHROMAX) 500 mg in dextrose 5 % 250 mL IVPB  Status:  Discontinued     500 mg 250 mL/hr over 60 Minutes Intravenous Every 24 hours 10/28/17 0514 10/28/17 0518   10/28/17 0037  azithromycin (ZITHROMAX) 500 MG injection    Comments:  Cordie GriceMarcum, Ruth   : cabinet override      10/28/17 0037 10/28/17 1244   10/28/17 0030  cefTRIAXone (ROCEPHIN) 1 g in dextrose 5 % 50 mL IVPB     1 g 100 mL/hr over 30 Minutes Intravenous  Once 10/28/17 0020 10/28/17 0207   10/28/17 0030  azithromycin (ZITHROMAX) 500 mg in dextrose 5 % 250 mL IVPB     500 mg 250 mL/hr over 60 Minutes Intravenous  Once 10/28/17 0020 10/28/17 [id number]      Patient with left lower lobe community-acquired pneumonia, code sepsis activated however, he had already received some IV antibiotics. - Lactic acid wnl and procalcitonin 1.08, WBC up 26.0, but now afebrile.  He is tachy with rates 120-140's.  CT shows some consolidation/infiltrate - pleural effusion.  Plan is to continue IV antibiotics currently ordered and trend cultures.  Will f/u length of therapy, therapeutic response.  Nadara MustardNita  Simone Tuckey, PharmD., MS Clinical Pharmacist Pager:  [phone] Thank you for allowing pharmacy to be part of this patients care team. Chinita GreenlandJohnston, Monalisa Bayless Faye, PharmD 10/28/2017, 1:19 PM
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
## 2017-10-28 NOTE — Procedures (Signed)
      [address]       [name] [id number]             [phone]      Chest Tube Insertion Procedure Note  Indications:  Clinically significant Effusion  Pre-operative Diagnosis: Effusion  Post-operative Diagnosis: Effusion  Procedure Details  Informed consent was obtained for the procedure, including sedation.  Risks of lung perforation, hemorrhage, arrhythmia, and adverse drug reaction were discussed.   After sterile skin prep, using standard technique, a 28 French tube was placed in the left lateral 6 rib space.  Findings: 100 ml of serous fluid obtained  Estimated Blood Loss:  Minimal         Specimens:  Sent serosanguinous fluid              Complications:  None; patient tolerated the procedure well.         Disposition: inpatient in icu         Condition: stable  Attending Attestation: I was present and scrubbed for the entire procedure.  Follow up chest xray tube in place little change in expansion of the lung
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
## 2017-10-28 NOTE — ED Notes (Signed)
ED Provider at bedside. 

## 2017-10-28 NOTE — ED Notes (Signed)
Pt to CT in bed.

## 2017-10-28 NOTE — Progress Notes (Signed)
Addendum  I received a call from Dr. Denny LevySchick, IR after thoracentesis. he indicated that approximately 510 ML of fluid was removed which was not bloody. Follow-up chest x-ray showed whiteout of left lung fields. Thereby CT chest was performed which showed enlargement of left effusion with loculations, worsening near complete collapse/consolidation of entire left lung and based on density did not seem like hemothorax.  I personally went by bedside, interviewed and examined patient. Patient reports ongoing dyspnea without worsening or improvement compared to this morning. Ongoing chest pain not controlled by current regimen and asking for different medications.  Did not appear in overt distress. Apart from sinus tachycardia and mild tachypnea, vital signs stable. Diminished breath sounds in the left lung fields. Patient alert and oriented.  Discussed in detail with patient in the presence of his spouse, brother and mother. Updated new information since this morning.  I discussed with Dr. Molli KnockYacoub, Pulmonology who recommended getting a thoracic surgery consultation. I discussed and consulted Dr. Tyrone SageGerhardt, TCTS.  Elijah ScottAnand Hongalgi, MD, FACP, [facility]. [facility] Pager [phone]  If 7PM-7AM, please contact night-coverage [email or web address] Password [email or web address] 10/28/2017, 7:19 PM

## 2017-10-28 NOTE — Progress Notes (Addendum)
[address]       [place] [address]             [phone]        Elijah Gillespie [facility] Medical Record #[id number] Date of Birth: 1971-02-21  Referring: No ref. provider found Primary Care: Patria Mane, MD Primary Cardiologist:No primary care provider on file.  Chief Complaint:    Chief Complaint  Patient presents with  . Shortness of Breath    History of Present Illness:     Asked by Dr. Waymon Amato to emergently see patient after presenting yesterday with signs and symptoms of sepsis and pneumonia.  He was sent for thoracentesis this afternoon.  Follow-up chest x-ray and then follow-up CT shows "white out" of the left lung.  A portion of this appears to be effusion, likely the patient also has some increasing atelectasis of the lower and upper lobe.   CT density of the effusion does not appear to be blood.   Patient gives history of injury and fracture of his manubrium 2 months ago.  He attributes all of his current chest discomfort shortness of breath and pneumonia to the trauma.  On exam his manubrium is not tender, there are no obvious displaced rib fractures on CT   Current Activity/ Functional Status: Patient is independent with mobility/ambulation, transfers, ADL's, IADL's.   Zubrod Score: At the time of surgery this patient's most appropriate activity status/level should be described as: []     0    Normal activity, no symptoms [x]     1    Restricted in physical strenuous activity but ambulatory, able to do out light work []     2    Ambulatory and capable of self care, unable to do work activities, up and about                 more than 50%  Of the time                            []     3    Only limited self care, in bed greater than 50% of waking hours []     4    Completely disabled, no self care, confined to bed or chair []     5    Moribund  Past Medical History:  Diagnosis Date  . Depression with anxiety   . GERD (gastroesophageal  reflux disease)   . Hyperlipidemia   . Hypertension   . Tobacco abuse     Past Surgical History:  Procedure Laterality Date  . ANKLE SURGERY    . FEMUR FRACTURE SURGERY    . IR THORACENTESIS ASP PLEURAL SPACE W/IMG GUIDE  10/28/2017  . KNEE SURGERY    . ROTATOR CUFF REPAIR      Social History   Tobacco Use  Smoking Status Current Every Day Smoker  . Packs/day: 1.00  . Types: Cigarettes  Smokeless Tobacco Never Used    Social History   Substance and Sexual Activity  Alcohol Use No  . Alcohol/week: 4.8 oz  . Types: 4 Cans of beer, 4 Shots of liquor per week  . Frequency: Never   Comment: denies use    Social History   Socioeconomic History  . Marital status: Married    Spouse name: Not on file  . Number of children: Not on file  . Years of education: Not on file  . Highest education  level: Not on file  Social Needs  . Financial resource strain: Not on file  . Food insecurity - worry: Not on file  . Food insecurity - inability: Not on file  . Transportation needs - medical: Not on file  . Transportation needs - non-medical: Not on file  Occupational History  . Not on file  Tobacco Use  . Smoking status: Current Every Day Smoker    Packs/day: 1.00    Types: Cigarettes  . Smokeless tobacco: Never Used  Substance and Sexual Activity  . Alcohol use: No    Alcohol/week: 4.8 oz    Types: 4 Cans of beer, 4 Shots of liquor per week    Frequency: Never    Comment: denies use  . Drug use: No  . Sexual activity: Not on file  Other Topics Concern  . Not on file  Social History Narrative  . Not on file    No Known Allergies  Current Facility-Administered Medications  Medication Dose Route Frequency Provider Last Rate Last Dose  . 0.9 %  sodium chloride infusion   Intravenous Continuous Elease EtienneHongalgi, Anand D, MD 100 mL/hr at 10/28/17 1600    . acetaminophen (TYLENOL) tablet 650 mg  650 mg Oral Q6H PRN Hongalgi, Anand D, MD      . atorvastatin (LIPITOR) tablet 20  mg  20 mg Oral Daily Lorretta HarpNiu, Xilin, MD   20 mg at 10/28/17 1037  . azithromycin (ZITHROMAX) 500 mg in dextrose 5 % 250 mL IVPB  500 mg Intravenous QHS Nanavati, Ankit, MD      . cefTRIAXone (ROCEPHIN) 1 g in dextrose 5 % 50 mL IVPB  1 g Intravenous QHS Nanavati, Ankit, MD      . chlorhexidine (PERIDEX) 0.12 % solution 15 mL  15 mL Mouth Rinse BID Lorretta HarpNiu, Xilin, MD   15 mL at 10/28/17 1019  . citalopram (CELEXA) tablet 20 mg  20 mg Oral Daily Lorretta HarpNiu, Xilin, MD   20 mg at 10/28/17 1200  . dextromethorphan-guaiFENesin (MUCINEX DM) 30-600 MG per 12 hr tablet 1 tablet  1 tablet Oral BID Lorretta HarpNiu, Xilin, MD   1 tablet at 10/28/17 1020  . hydrALAZINE (APRESOLINE) injection 5 mg  5 mg Intravenous Q2H PRN Lorretta HarpNiu, Xilin, MD      . HYDROmorphone (DILAUDID) injection 1 mg  1 mg Intravenous Q4H PRN Elease EtienneHongalgi, Anand D, MD   1 mg at 10/28/17 1815  . levalbuterol (XOPENEX) nebulizer solution 0.63 mg  0.63 mg Nebulization Q6H PRN Lorretta HarpNiu, Xilin, MD      . lidocaine (PF) (XYLOCAINE) 1 % injection    PRN Berdine DanceShick, Michael, MD   10 mL at 10/28/17 1610  . lidocaine 20 MG/ML injection           . MEDLINE mouth rinse  15 mL Mouth Rinse q12n4p Lorretta HarpNiu, Xilin, MD   15 mL at 10/28/17 1600  . nicotine (NICODERM CQ - dosed in mg/24 hours) patch 21 mg  21 mg Transdermal Daily Lorretta HarpNiu, Xilin, MD      . ondansetron [facility](ZOFRAN) injection 4 mg  4 mg Intravenous Q8H PRN Lorretta HarpNiu, Xilin, MD      . oxyCODONE-acetaminophen (PERCOCET/ROXICET) 5-325 MG per tablet 1 tablet  1 tablet Oral Q6H PRN Elease EtienneHongalgi, Anand D, MD   1 tablet at 10/28/17 1306  . pantoprazole (PROTONIX) EC tablet 40 mg  40 mg Oral Daily Lorretta HarpNiu, Xilin, MD   40 mg at 10/28/17 1200  . zolpidem (AMBIEN) tablet 5 mg  5 mg Oral QHS PRN  Lorretta HarpNiu, Xilin, MD        Medications Prior to Admission  Medication Sig Dispense Refill Last Dose  . atorvastatin (LIPITOR) 20 MG tablet Take 20 mg by mouth every morning.    10/25/2017  . citalopram (CELEXA) 20 MG tablet Take 20 mg by mouth daily.   10/25/2017  . naproxen (NAPROSYN)  500 MG tablet Take 1 tablet (500 mg total) 2 (two) times daily by mouth. 30 tablet 0 10/26/2017  . omeprazole (PRILOSEC) 20 MG capsule Take 20 mg by mouth 2 (two) times daily.   10/25/2017  . oxyCODONE-acetaminophen (PERCOCET/ROXICET) 5-325 MG tablet Take 1 tablet by mouth every 8 (eight) hours as needed for severe pain.    10/27/2017 at Unknown time  . traZODone (DESYREL) 50 MG tablet Take 50 mg by mouth at bedtime as needed for sleep.   10/26/2017  . diazepam (VALIUM) 5 MG tablet Take 1 tablet (5 mg total) by mouth 2 (two) times daily. (Patient not taking: Reported on 10/28/2017) 10 tablet 0 Not Taking at Unknown time    Family History  Problem Relation Age of Onset  . Diabetes Mellitus II Mother   . Breast cancer Mother   . Diabetes Mellitus II Father      Review of Systems:   Review of Systems  Constitutional: Positive for chills, diaphoresis, fever, malaise/fatigue and weight loss.  HENT: Positive for congestion.   Eyes: Negative.   Respiratory: Positive for cough, sputum production, shortness of breath, wheezing and stridor. Negative for hemoptysis.   Cardiovascular: Positive for chest pain and palpitations. Negative for orthopnea, claudication, leg swelling and PND.  Gastrointestinal: Negative for blood in stool, constipation, diarrhea, heartburn, melena, nausea and vomiting.  Genitourinary: Negative.   Musculoskeletal: Positive for back pain.  Skin: Negative for itching and rash.  Neurological: Positive for weakness. Negative for dizziness, tingling, tremors, sensory change, speech change, focal weakness, seizures and headaches.  Endo/Heme/Allergies: Negative.   Psychiatric/Behavioral: Negative.      Physical Exam: BP (!) 141/73   Pulse (!) 124   Temp 99.7 F (37.6 C)   Resp (!) 26   Ht 6\' 3"  (1.905 m)   Wt 274 lb 7.6 oz (124.5 kg)   SpO2 95%   BMI 34.31 kg/m    General appearance: alert, cooperative, appears older than stated age, fatigued and moderate  distress Head: Normocephalic, without obvious abnormality, atraumatic Neck: no adenopathy, no carotid bruit, no JVD, supple, symmetrical, trachea midline and right thyriod enlarged  Lymph nodes: Cervical, supraclavicular, and axillary nodes normal. Resp: diminished breath sounds LLL and LUL Back: symmetric, no curvature. ROM normal. No CVA tenderness. Cardio: regular rate and rhythm, S1, S2 normal, no murmur, click, rub or gallop GI: soft, non-tender; bowel sounds normal; no masses,  no organomegaly Extremities: extremities normal, atraumatic, no cyanosis or edema and Homans sign is negative, no sign of DVT Neurologic: Grossly normal No tenderness on palpation of the manubrium  Diagnostic Studies & Laboratory data:     Recent Radiology Findings:   Dg Chest 1 View  Result Date: 10/28/2017 CLINICAL DATA:  Followup left thoracentesis. EXAM: CHEST 1 VIEW COMPARISON:  CT same day.  Radiography 10/27/2017. FINDINGS: Newly seen complete opacification of the left hemithorax without mediastinal shift. This could be due to a combination of pleural fluid and pulmonary collapse. No pneumothorax. Right chest remains clear. IMPRESSION: Complete opacification of the left hemithorax without mediastinal shift. This could be due to a combination of pleural fluid and pulmonary collapse. Electronically Signed  By: Paulina Fusi M.D.   On: 10/28/2017 16:18   Dg Chest 2 View  Result Date: 10/28/2017 CLINICAL DATA:  Acute onset of cough and tachypnea. Tachycardia. Subacute onset of shortness of breath. Prior sternal fracture in October. EXAM: CHEST  2 VIEW COMPARISON:  Chest radiograph performed 09/20/2017 FINDINGS: Mild left basilar airspace opacity likely reflects atelectasis. No significant pleural effusion or pneumothorax is seen The heart is normal in size. No acute osseous abnormalities are identified. IMPRESSION: Mild left basilar airspace opacity likely reflects atelectasis. Lungs otherwise clear.  Electronically Signed   By: Roanna Raider M.D.   On: 10/28/2017 00:13   Ct Chest Wo Contrast  Result Date: 10/28/2017 CLINICAL DATA:  Pleural effusion headache, pneumonia, or recent left chest trauma with healing left rib fractures. Status post thoracentesis. Post thoracentesis chest x-ray demonstrated complete opacification of the left chest. EXAM: CT CHEST WITHOUT CONTRAST TECHNIQUE: Multidetector CT imaging of the chest was performed following the standard protocol without IV contrast. COMPARISON:  10/28/2017 FINDINGS: Cardiovascular: Normal heart size. No pericardial effusion patent. Minor thoracic aortic atherosclerosis. Mediastinum/Nodes: Right thyroid nodular enlargement as before measuring up to 5 cm. Recommend follow-up thyroid ultrasound outpatient. Trachea and esophagus unremarkable by noncontrast imaging. No adenopathy. Lungs/Pleura: Continued enlargement of the left effusion with developing loculation superiorly. This is despite having a left thoracentesis yielding 510 cc earlier today. Effusion has low Hounsfield units and does not appear to represent hemothorax. There is now near-complete collapse/consolidation of the left lung. Difficult to exclude central mucous plugging as well. Minor right base atelectasis.  No right effusion. Upper Abdomen: No significant or acute finding by noncontrast imaging Musculoskeletal: Healing posterior left rib fractures. No other acute osseous finding. IMPRESSION: Continued enlargement in the left effusion with developing loculation in the apex. Worsening near complete collapse / consolidation of the entire left lung. Difficult to exclude a component of central mucous plugging. Effusion remains low attenuation with Hounsfield measurements less than 15. No evidence of hemothorax following thoracentesis. These results were called by telephone at the time of interpretation on 10/28/2017 at 5:44 pm to Dr. Waymon Amato, who verbally acknowledged these results.  Electronically Signed   By: Judie Petit.  Shick M.D.   On: 10/28/2017 17:46   Ct Angio Chest Pe W And/or Wo Contrast  Result Date: 10/28/2017 CLINICAL DATA:  46 year old male with difficulty breathing. EXAM: CT ANGIOGRAPHY CHEST WITH CONTRAST TECHNIQUE: Multidetector CT imaging of the chest was performed using the standard protocol during bolus administration of intravenous contrast. Multiplanar CT image reconstructions and MIPs were obtained to evaluate the vascular anatomy. CONTRAST:  ISOVUE-370 IOPAMIDOL (ISOVUE-370) INJECTION 76% COMPARISON:  Chest radiograph dated 10/27/2017 head CT dated 08/23/2017 FINDINGS: Cardiovascular: There is no cardiomegaly or pericardial effusion. The thoracic aorta appears unremarkable. The origins of the great vessels of the aortic arch appear patent. Evaluation of the pulmonary artery is limited due to suboptimal opacification of the peripheral branches. No definite large or central pulmonary artery embolus identified. Mediastinum/Nodes: No right hilar or mediastinal adenopathy. Evaluation for left hilar lymph node is limited due to consolidative changes of adjacent lung. These however mildly enlarged appearing left hilar lymph nodes measuring 1.5 cm. There is a 5.2 cm right thyroid nodule as seen on the prior CT. Further evaluation with ultrasound, if not already performed, recommended. Lungs/Pleura: There is a moderate size left pleural effusion with associated partial compressive atelectasis of the left lower lobe versus pneumonia. Clinical correlation is recommended. The right lung is clear. A No pneumothorax. The  central airways are patent. Upper Abdomen: There is a small hiatal hernia. Fluid within the esophagus consistent with gastroesophageal reflux. Musculoskeletal: Multiple subacute or chronic left posterior rib fractures, with incomplete healing. No acute fracture. Review of the MIP images confirms the above findings. IMPRESSION: 1. No CT evidence of central pulmonary  artery embolus. 2. Moderate left pleural effusion and left lung base atelectasis versus infiltrate. 3. A 5.2 cm right thyroid nodule.  She Electronically Signed   By: Elgie Collard M.D.   On: 10/28/2017 01:53   Ir Thoracentesis Asp Pleural Space W/img Guide  Result Date: 10/28/2017 INDICATION: Recent left chest trauma, left effusion, pneumonia, shortness of breath, fever, chest pain EXAM: ULTRASOUND GUIDED LEFT THORACENTESIS MEDICATIONS: 1% lidocaine local COMPLICATIONS: None immediate. PROCEDURE: An ultrasound guided thoracentesis was thoroughly discussed with the patient and questions answered. The benefits, risks, alternatives and complications were also discussed. The patient understands and wishes to proceed with the procedure. Written consent was obtained. Ultrasound was performed to localize and mark an adequate pocket of fluid in the left chest. The area was then prepped and draped in the normal sterile fashion. 1% Lidocaine was used for local anesthesia. Under ultrasound guidance a 6 Fr Safe-T-Centesis catheter was introduced. Thoracentesis was performed. The catheter was removed and a dressing applied. FINDINGS: A total of approximately 510 cc of exudative pleural fluid was removed. Samples were sent to the laboratory as requested by the clinical team. IMPRESSION: Successful ultrasound guided left thoracentesis yielding 510 cc of pleural fluid. Electronically Signed   By: Judie Petit.  Shick M.D.   On: 10/28/2017 16:22   I have independently reviewed the above radiology studies  and reviewed the findings with the patient. Discussed findings  with DR [facility]     Recent Lab Findings: Lab Results  Component Value Date   WBC 26.0 (H) 10/28/2017   HGB 10.1 (L) 10/28/2017   HCT 31.7 (L) 10/28/2017   PLT 305 10/28/2017   GLUCOSE 148 (H) 10/27/2017   NA 139 10/27/2017   K 3.8 10/27/2017   CL 101 10/27/2017   CREATININE 0.96 10/27/2017   BUN 11 10/27/2017   CO2 29 10/27/2017   INR 1.24  10/28/2017      Assessment / Plan:   Admission with rapidly progressing pneumonia symptoms with evidence of sepsis, white count elevated to 26,000 now with white out of the left chest after attempted thoracentesis.  Discussed with Dr. Molli Knock, will plan placement of chest tube.  Likely the patient will require intubation and bronchoscopy with cultures.  I discussed placement of left chest tube with the patient in detail and he is willing to proceed. Monitor serial H/H has dropped from admission to this am. Follow up ordered   [name] MD      [address] [place] [place] [address] Office [phone]   Alvino Chapel [phone]  10/28/2017 6:33 PM     Patient ID: Elijah Gillespie, male   DOB: 1971-11-02, 46 y.o.   MRN: [id number]

## 2017-10-28 NOTE — ED Notes (Signed)
Unable to establish 2nd IV; MD notified. Will US for 2nd IV at a later time.

## 2017-10-29 ENCOUNTER — Inpatient Hospital Stay (HOSPITAL_COMMUNITY): Payer: Medicaid Other

## 2017-10-29 DIAGNOSIS — J181 Lobar pneumonia, unspecified organism: Secondary | ICD-10-CM

## 2017-10-29 DIAGNOSIS — A419 Sepsis, unspecified organism: Secondary | ICD-10-CM

## 2017-10-29 LAB — CBC WITH DIFFERENTIAL/PLATELET
Basophils Absolute: 0 10*3/uL (ref 0.0–0.1)
Basophils Relative: 0 %
EOS PCT: 1 %
Eosinophils Absolute: 0.3 10*3/uL (ref 0.0–0.7)
HEMATOCRIT: 37.8 % — AB (ref 39.0–52.0)
HEMOGLOBIN: 11.8 g/dL — AB (ref 13.0–17.0)
LYMPHS PCT: 5 %
Lymphs Abs: 1.7 10*3/uL (ref 0.7–4.0)
MCH: 27.3 pg (ref 26.0–34.0)
MCHC: 31.2 g/dL (ref 30.0–36.0)
MCV: 87.5 fL (ref 78.0–100.0)
MONO ABS: 2.3 10*3/uL — AB (ref 0.1–1.0)
MONOS PCT: 7 %
NEUTROS PCT: 87 %
Neutro Abs: 29.1 10*3/uL — ABNORMAL HIGH (ref 1.7–7.7)
PLATELETS: 335 10*3/uL (ref 150–400)
RBC: 4.32 MIL/uL (ref 4.22–5.81)
RDW: 15.4 % (ref 11.5–15.5)
WBC MORPHOLOGY: INCREASED
WBC: 33.4 10*3/uL — AB (ref 4.0–10.5)

## 2017-10-29 LAB — COMPREHENSIVE METABOLIC PANEL
ALT: 24 U/L (ref 17–63)
AST: 15 U/L (ref 15–41)
Albumin: 1.7 g/dL — ABNORMAL LOW (ref 3.5–5.0)
Alkaline Phosphatase: 70 U/L (ref 38–126)
Anion gap: 6 (ref 5–15)
BUN: 13 mg/dL (ref 6–20)
CO2: 27 mmol/L (ref 22–32)
Calcium: 8.2 mg/dL — ABNORMAL LOW (ref 8.9–10.3)
Chloride: 100 mmol/L — ABNORMAL LOW (ref 101–111)
Creatinine, Ser: 0.71 mg/dL (ref 0.61–1.24)
GFR calc Af Amer: >60 mL/min (ref >60)
GFR calc non Af Amer: >60 mL/min (ref >60)
Glucose, Bld: 155 mg/dL — ABNORMAL HIGH (ref 65–99)
Potassium: 3.9 mmol/L (ref 3.5–5.1)
Sodium: 133 mmol/L — ABNORMAL LOW (ref 135–145)
Total Bilirubin: 0.5 mg/dL (ref 0.3–1.2)
Total Protein: 5.5 g/dL — ABNORMAL LOW (ref 6.5–8.1)

## 2017-10-29 LAB — CBC
HCT: 35.2 % — ABNORMAL LOW (ref 39.0–52.0)
Hemoglobin: 10.7 g/dL — ABNORMAL LOW (ref 13.0–17.0)
MCH: 26.5 pg (ref 26.0–34.0)
MCHC: 30.4 g/dL (ref 30.0–36.0)
MCV: 87.1 fL (ref 78.0–100.0)
Platelets: 355 10*3/uL (ref 150–400)
RBC: 4.04 MIL/uL — ABNORMAL LOW (ref 4.22–5.81)
RDW: 15.8 % — ABNORMAL HIGH (ref 11.5–15.5)
WBC: 23.6 10*3/uL — ABNORMAL HIGH (ref 4.0–10.5)

## 2017-10-29 LAB — POCT I-STAT 3, ART BLOOD GAS (G3+)
ACID-BASE EXCESS: 1 mmol/L (ref 0.0–2.0)
BICARBONATE: 27.8 mmol/L (ref 20.0–28.0)
O2 SAT: 94 %
PO2 ART: 76 mmHg — AB (ref 83.0–108.0)
Patient temperature: 98.6
TCO2: 29 mmol/L (ref 22–32)
pCO2 arterial: 53 mmHg — ABNORMAL HIGH (ref 32.0–48.0)
pH, Arterial: 7.328 — ABNORMAL LOW (ref 7.350–7.450)

## 2017-10-29 LAB — BASIC METABOLIC PANEL
Anion gap: 9 (ref 5–15)
BUN: 15 mg/dL (ref 6–20)
CO2: 28 mmol/L (ref 22–32)
Calcium: 8.4 mg/dL — ABNORMAL LOW (ref 8.9–10.3)
Chloride: 101 mmol/L (ref 101–111)
Creatinine, Ser: 0.77 mg/dL (ref 0.61–1.24)
GFR calc Af Amer: >60 mL/min (ref >60)
GFR calc non Af Amer: >60 mL/min (ref >60)
Glucose, Bld: 136 mg/dL — ABNORMAL HIGH (ref 65–99)
Potassium: 4.4 mmol/L (ref 3.5–5.1)
Sodium: 138 mmol/L (ref 135–145)

## 2017-10-29 LAB — TSH: TSH: 2.115 u[IU]/mL (ref 0.350–4.500)

## 2017-10-29 LAB — PROTIME-INR
INR: 1.17
Prothrombin Time: 14.8 seconds (ref 11.4–15.2)

## 2017-10-29 LAB — APTT: aPTT: 44 seconds — ABNORMAL HIGH (ref 24–36)

## 2017-10-29 LAB — T3, FREE: T3 FREE: 1.9 pg/mL — AB (ref 2.0–4.4)

## 2017-10-29 LAB — STREP PNEUMONIAE URINARY ANTIGEN: STREP PNEUMO URINARY ANTIGEN: NEGATIVE

## 2017-10-29 MED ORDER — FENTANYL CITRATE (PF) 100 MCG/2ML IJ SOLN
INTRAMUSCULAR | Status: AC
  Administered 2017-10-29: 100 ug via INTRAVENOUS
  Filled 2017-10-29: qty 2

## 2017-10-29 MED ORDER — ALBUTEROL SULFATE (2.5 MG/3ML) 0.083% IN NEBU
2.5000 mg | INHALATION_SOLUTION | RESPIRATORY_TRACT | Status: DC
  Administered 2017-10-29 – 2017-10-30 (×4): 2.5 mg via RESPIRATORY_TRACT
  Filled 2017-10-29 (×5): qty 3

## 2017-10-29 MED ORDER — LIDOCAINE HCL 2 % EX GEL
1.0000 "application " | Freq: Once | CUTANEOUS | Status: AC
  Administered 2017-10-29: 1 via TOPICAL

## 2017-10-29 MED ORDER — LIDOCAINE HCL (PF) 1 % IJ SOLN
5.0000 mL | Freq: Once | INTRAMUSCULAR | Status: AC
  Administered 2017-10-29: 5 mL

## 2017-10-29 MED ORDER — MIDAZOLAM HCL 2 MG/2ML IJ SOLN
2.0000 mg | Freq: Once | INTRAMUSCULAR | Status: AC
  Administered 2017-10-29 (×2): 2 mg via INTRAVENOUS
  Filled 2017-10-29: qty 2

## 2017-10-29 MED ORDER — FENTANYL CITRATE (PF) 100 MCG/2ML IJ SOLN
100.0000 ug | Freq: Once | INTRAMUSCULAR | Status: AC
  Administered 2017-10-29 (×2): 100 ug via INTRAVENOUS
  Filled 2017-10-29: qty 2

## 2017-10-29 MED ORDER — SODIUM CHLORIDE 3 % IN NEBU
4.0000 mL | INHALATION_SOLUTION | Freq: Every day | RESPIRATORY_TRACT | Status: DC
  Administered 2017-10-29: 4 mL via RESPIRATORY_TRACT
  Filled 2017-10-29 (×2): qty 4

## 2017-10-29 MED ORDER — PIPERACILLIN-TAZOBACTAM 3.375 G IVPB
3.3750 g | Freq: Three times a day (TID) | INTRAVENOUS | Status: DC
  Administered 2017-10-29 – 2017-11-01 (×8): 3.375 g via INTRAVENOUS
  Filled 2017-10-29 (×10): qty 50

## 2017-10-29 MED ORDER — MIDAZOLAM HCL 2 MG/2ML IJ SOLN
INTRAMUSCULAR | Status: AC
  Administered 2017-10-29: 2 mg via INTRAVENOUS
  Filled 2017-10-29: qty 2

## 2017-10-29 MED ORDER — PIPERACILLIN-TAZOBACTAM 3.375 G IVPB 30 MIN
3.3750 g | Freq: Once | INTRAVENOUS | Status: AC
  Administered 2017-10-29: 3.375 g via INTRAVENOUS
  Filled 2017-10-29: qty 50

## 2017-10-29 MED ORDER — SODIUM CHLORIDE 0.9 % IV SOLN
2500.0000 mg | Freq: Once | INTRAVENOUS | Status: AC
  Administered 2017-10-29: 2500 mg via INTRAVENOUS
  Filled 2017-10-29: qty 2500

## 2017-10-29 MED ORDER — VANCOMYCIN HCL 10 G IV SOLR
1250.0000 mg | Freq: Three times a day (TID) | INTRAVENOUS | Status: DC
  Administered 2017-10-29 – 2017-10-31 (×6): 1250 mg via INTRAVENOUS
  Filled 2017-10-29 (×6): qty 1250

## 2017-10-29 MED ORDER — PHENYLEPHRINE HCL 0.25 % NA SOLN
1.0000 | Freq: Four times a day (QID) | NASAL | Status: DC | PRN
  Administered 2017-10-29: 1 via NASAL
  Filled 2017-10-29: qty 15

## 2017-10-29 MED ORDER — METOPROLOL TARTRATE 5 MG/5ML IV SOLN
5.0000 mg | Freq: Four times a day (QID) | INTRAVENOUS | Status: DC | PRN
  Administered 2017-10-29: 5 mg via INTRAVENOUS
  Filled 2017-10-29: qty 5

## 2017-10-29 NOTE — Procedures (Signed)
Procedure  Conscious sedation for bronchoscopy   Informed consent: obtained  Procedure monitoring -pulse ox -ETCo2 -tele -BP  Meds: 6mg  total versed (given in 2mg  intervals) 300 mcg fentanyl (given in 100 mcg intervals)  Description   -pulse ox, EtCO2, tele, NIBP in place w/ supplemental oxygen at 6 liters and IV access established.  -glide scope, boogie, and airway supplies at bedside.  -RT and ICU RN at bedside.   The pt was administered versed and fent in intervals as described above. Once adequate sedation was achieved he underwent bronchoscopy (see Dr Percival SpanishYacoub's note). He tolerated the procedure well. Pulse ox never dropped below 90%. HR briefly elevated to as high as 154.  This subsided to low 120s He was sat up for recovery. Avoided left decub given left sided opacification and fear it might worsen hypoxia.   Procedure time 10:48 to 1209.  I was personally at the bedside observing the patient during this time he was able to awaken w/ soft verbal command and cough productively.   At completion of procedural monitoring he  Was fully awake and alert. Pulse ox 94 %, work of breathing at baseline and actually able to participate in RT guided chest exercises w/ Flutter and thera-pep.   Simonne MartinetPeter E Babcock ACNP-BC [facility] Pager # [phone] OR # [phone] if no answer  I was present and administered the medications and supervised conscious sedation.  Alyson ReedyWesam G. Yacoub, M.D. [facility]. Pager: [phone]. After hours pager: [phone].

## 2017-10-29 NOTE — Progress Notes (Signed)
TRH Signoff note.  Events since last night appreciated. Status post left-sided chest tube. Chest x-ray shows persistent whiteout. TCTS & PCCM following.  I discussed with Dr. Molli KnockYacoub, CCM outside patient's room and he was getting ready to perform bedside FOB. CCM has assumed Attending care of patient in the hospital. TRH will sign off at this time. Please contact us for any further assistance.  Marcellus ScottAnand Chania Kochanski, MD, FACP, [facility]. [facility] Pager [phone]  If 7PM-7AM, please contact night-coverage [email or web address] Password [email or web address] 10/29/2017, 11:33 AM

## 2017-10-29 NOTE — Progress Notes (Signed)
Patient performed thera-pep with good effort.  Total of 10 breaths.   Patient tolerated well.

## 2017-10-29 NOTE — Progress Notes (Signed)
Patient resting on 6L [place] currently with no respiratory distress noted. BIPAP is not needed at this time. RT will monitor as needed.

## 2017-10-29 NOTE — Progress Notes (Signed)
Pt transported to radiology on cardiac monitor via transporter and swat nurse. Left plueral ct intact, no air leak noted.

## 2017-10-29 NOTE — Progress Notes (Signed)
Pt performed thera-pep for 10 breaths with good effort. Pt tolerated well.

## 2017-10-29 NOTE — Progress Notes (Signed)
Patient given 1200 nebulizer treatment with flutter valve.  Patient tolerated well through entire treatment.  After treatment was completed, performed thera-pep with patient.  Patient performed, with good effort.  Will continue to monitor.

## 2017-10-29 NOTE — Progress Notes (Signed)
Pharmacy Antibiotic Note  Elijah Gillespie is a 46 y.o. male admitted on 10/27/2017 with pneumonia.  Pharmacy has been consulted for Zosyn and vancomycin dosing. CTA showed left sided pleural effusion. S/p thoracentesis and chest tube insertion. WBC elevated at 33.4. SCr wnl. nCrCl ~ 120 mL/min. Currently on IV ceftriaxone and azithromycin which will be stopped.   Plan: -Zosyn 3.375 gm IV once over 30 minutes, then Zosyn 3.375 gm IV Q 8 hours (EI infusion) -Vancomycin 2500 mg IV once, then vancomycin 1250 mg IV Q 8 hours -Monitor CBC, renal fx, cultures and clinical progress -VT at SS   Height: _0  (190.5 cm) Weight: 274 lb 7.6 oz (124.5 kg) IBW/kg (Calculated) : 84.5  Temp (24hrs), Avg:98.7 F (37.1 C), Min:97.8 F (36.6 C), Max:99.7 F (37.6 C)  Recent Labs  Lab 10/27/17 2325 10/27/17 2345 10/28/17 0551 10/28/17 0932 10/28/17 2004 10/29/17 0309  WBC 16.8*  --  26.0*  --  35.9* 33.4*  CREATININE 0.96  --   --   --   --  0.77  LATICACIDVEN  --  1.67 0.6 0.8  --   --     Estimated Creatinine Clearance: 164 mL/min (by C-G formula based on SCr of 0.77 mg/dL).    No Known Allergies  Antimicrobials this admission: Ceftriaxone 12/26 >> 12/27 Azithromycin 12/26 >> 12/27 Zosyn 12/27>> Vancomycin 12/27>>   Dose adjustments this admission: None   Microbiology results: 12/26 BCx>>  12/26 Resp panel >> NEG Left pleural fluid Gram stain 12/26: Abundant WBCs no organisms Left pleural fluid culture 12/26: Abundant WBCs>>> Blood culture 12/26>>>> BAL 12/27>>>  Thank you for allowing pharmacy to be a part of this patient's care.  Albertina Parr, PharmD., BCPS Clinical Pharmacist Pager [phone]

## 2017-10-29 NOTE — Progress Notes (Addendum)
TCTS DAILY ICU PROGRESS NOTE                   [address]            [name] [id number]          [phone]       Total Length of Stay:  LOS: 1 day   Subjective: Chest tube drained 885 cc so far, feels a little better  Objective: Vital signs in last 24 hours: Temp:  [97.8 F (36.6 C)-99.7 F (37.6 C)] 98.1 F (36.7 C) (12/27 0720) Pulse Rate:  [112-131] 118 (12/27 0700) Cardiac Rhythm: Sinus tachycardia (12/27 0400) Resp:  [20-35] 20 (12/27 0700) BP: (113-156)/(54-113) 156/113 (12/27 0700) SpO2:  [92 %-97 %] 95 % (12/27 0700)  Filed Weights   10/27/17 2323 10/28/17 0500  Weight: 283 lb 7 oz (128.6 kg) 274 lb 7.6 oz (124.5 kg)    Weight change:    Hemodynamic parameters for last 24 hours:    Intake/Output from previous day: 12/26 0701 - 12/27 0700 In: 2622.6 [I.V.:2322.6; IV Piggyback:300] Out: 885 [Urine:425; Chest Tube:460]  Intake/Output this shift: No intake/output data recorded.  Current Meds: Scheduled Meds: . atorvastatin  20 mg Oral Daily  . chlorhexidine  15 mL Mouth Rinse BID  . citalopram  20 mg Oral Daily  . dextromethorphan-guaiFENesin  1 tablet Oral BID  . HYDROmorphone   Intravenous Q4H  . mouth rinse  15 mL Mouth Rinse q12n4p  . nicotine  21 mg Transdermal Daily  . pantoprazole  40 mg Oral Daily  . pneumococcal 23 valent vaccine  0.5 mL Intramuscular Tomorrow-1000   Continuous Infusions: . azithromycin Stopped (10/28/17 2331)  . cefTRIAXone (ROCEPHIN)  IV Stopped (10/28/17 2211)   PRN Meds:.acetaminophen, diphenhydrAMINE **OR** diphenhydrAMINE, hydrALAZINE, levalbuterol, lidocaine (PF), naloxone **AND** sodium chloride flush, ondansetron (ZOFRAN) IV, oxyCODONE-acetaminophen, zolpidem  General appearance: alert, cooperative, fatigued and mild distress Heart: regular rate and rhythm Lungs: dim on left with wheeze Abdomen: benign Extremities: some edema Wound: CT site clean and dry  Lab Results: CBC: Recent Labs      10/28/17 2004 10/29/17 0309  WBC 35.9* 33.4*  HGB 11.6* 11.8*  HCT 36.8* 37.8*  PLT 359 335   BMET:  Recent Labs    10/27/17 2325 10/29/17 0309  NA 139 138  K 3.8 4.4  CL 101 101  CO2 29 28  GLUCOSE 148* 136*  BUN 11 15  CREATININE 0.96 0.77  CALCIUM 8.9 8.4*    CMET: Lab Results  Component Value Date   WBC 33.4 (H) 10/29/2017   HGB 11.8 (L) 10/29/2017   HCT 37.8 (L) 10/29/2017   PLT 335 10/29/2017   GLUCOSE 136 (H) 10/29/2017   NA 138 10/29/2017   K 4.4 10/29/2017   CL 101 10/29/2017   CREATININE 0.77 10/29/2017   BUN 15 10/29/2017   CO2 28 10/29/2017   TSH 2.115 10/29/2017   INR 1.24 10/28/2017      PT/INR:  Recent Labs    10/28/17 0551  LABPROT 15.5*  INR 1.24   Radiology: Dg Chest 1 View  Result Date: 10/28/2017 CLINICAL DATA:  Followup left thoracentesis. EXAM: CHEST 1 VIEW COMPARISON:  CT same day.  Radiography 10/27/2017. FINDINGS: Newly seen complete opacification of the left hemithorax without mediastinal shift. This could be due to a combination of pleural fluid and pulmonary collapse. No pneumothorax. Right chest remains clear. IMPRESSION: Complete opacification of the left hemithorax without mediastinal shift. This could  be due to a combination of pleural fluid and pulmonary collapse. Electronically Signed   By: Paulina FusiMark  Shogry M.D.   On: 10/28/2017 16:18   Ct Chest Wo Contrast  Result Date: 10/28/2017 CLINICAL DATA:  Pleural effusion headache, pneumonia, or recent left chest trauma with healing left rib fractures. Status post thoracentesis. Post thoracentesis chest x-ray demonstrated complete opacification of the left chest. EXAM: CT CHEST WITHOUT CONTRAST TECHNIQUE: Multidetector CT imaging of the chest was performed following the standard protocol without IV contrast. COMPARISON:  10/28/2017 FINDINGS: Cardiovascular: Normal heart size. No pericardial effusion patent. Minor thoracic aortic atherosclerosis. Mediastinum/Nodes: Right thyroid  nodular enlargement as before measuring up to 5 cm. Recommend follow-up thyroid ultrasound outpatient. Trachea and esophagus unremarkable by noncontrast imaging. No adenopathy. Lungs/Pleura: Continued enlargement of the left effusion with developing loculation superiorly. This is despite having a left thoracentesis yielding 510 cc earlier today. Effusion has low Hounsfield units and does not appear to represent hemothorax. There is now near-complete collapse/consolidation of the left lung. Difficult to exclude central mucous plugging as well. Minor right base atelectasis.  No right effusion. Upper Abdomen: No significant or acute finding by noncontrast imaging Musculoskeletal: Healing posterior left rib fractures. No other acute osseous finding. IMPRESSION: Continued enlargement in the left effusion with developing loculation in the apex. Worsening near complete collapse / consolidation of the entire left lung. Difficult to exclude a component of central mucous plugging. Effusion remains low attenuation with Hounsfield measurements less than 15. No evidence of hemothorax following thoracentesis. These results were called by telephone at the time of interpretation on 10/28/2017 at 5:44 pm to Dr. Waymon AmatoHongalgi, who verbally acknowledged these results. Electronically Signed   By: Judie PetitM.  Shick M.D.   On: 10/28/2017 17:46   Dg Chest Port 1 View  Result Date: 10/29/2017 CLINICAL DATA:  Left pleural effusion with chest tube drainage. EXAM: PORTABLE CHEST 1 VIEW COMPARISON:  Portable chest x-ray of October 28, 2017 FINDINGS: The right lung is well-expanded and clear. On the left minimal aerated lung is observed similar to that seen yesterday. The chest tube is in stable position at the lung base. The left heart border is completely obscured. The pulmonary vascularity on the right is clear. IMPRESSION: Persistent near-total opacification of the left hemithorax despite the presence of the left chest tube. No significant  improvement since yesterday's study. Electronically Signed   By: David  SwazilandJordan M.D.   On: 10/29/2017 07:48   Dg Chest Port 1 View  Result Date: 10/28/2017 CLINICAL DATA:  Chest tube EXAM: PORTABLE CHEST 1 VIEW COMPARISON:  10/28/2017, 08/23/2017 FINDINGS: Right lung is grossly clear. Placement of left lower chest tube with tip projecting slightly to the left of the lower thoracic spine. Extensive opacity in the left thorax with slight improved aeration at the peripheral left lower lung. No pneumothorax. Slight shift of mediastinal contents to the left. IMPRESSION: Interval insertion of left lower chest to with slightly improved aeration of the left lower lateral lung. Extensive airspace disease an opacity within the left thorax remains. Electronically Signed   By: Jasmine PangKim  Fujinaga M.D.   On: 10/28/2017 19:31   Ir Thoracentesis Asp Pleural Space W/img Guide  Result Date: 10/28/2017 INDICATION: Recent left chest trauma, left effusion, pneumonia, shortness of breath, fever, chest pain EXAM: ULTRASOUND GUIDED LEFT THORACENTESIS MEDICATIONS: 1% lidocaine local COMPLICATIONS: None immediate. PROCEDURE: An ultrasound guided thoracentesis was thoroughly discussed with the patient and questions answered. The benefits, risks, alternatives and complications were also discussed. The patient understands and  wishes to proceed with the procedure. Written consent was obtained. Ultrasound was performed to localize and mark an adequate pocket of fluid in the left chest. The area was then prepped and draped in the normal sterile fashion. 1% Lidocaine was used for local anesthesia. Under ultrasound guidance a 6 Fr Safe-T-Centesis catheter was introduced. Thoracentesis was performed. The catheter was removed and a dressing applied. FINDINGS: A total of approximately 510 cc of exudative pleural fluid was removed. Samples were sent to the laboratory as requested by the clinical team. IMPRESSION: Successful ultrasound guided left  thoracentesis yielding 510 cc of pleural fluid. Electronically Signed   By: Judie Petit.  Shick M.D.   On: 10/28/2017 16:22     Assessment/Plan: S/P left chest tube Moderate drainage, but left side shows white out- will need bronchoscopy Potentially could need VATS depending on course Leukocytosis slightly improved Cont aggressive medical management per CCM    Rowe Clack 10/29/2017 8:02 AM   Discussed with Dr Molli Knock- recommend bronchoscopy with cultures - consider VATS if does not clear  I have seen and examined Elijah Gillespie and agree with the above assessment  and plan.  [name] MD Beeper [phone] Office [phone] 10/29/2017 8:17 AM

## 2017-10-29 NOTE — Procedures (Signed)
Bedside Bronchoscopy Procedure Note Elijah Gillespie [id number] 11-26-1970  Procedure: Bronchoscopy Indications: Diagnostic evaluation of the airways and Obtain specimens for culture and/or other diagnostic studies  Procedure Details: In preparation for procedure, Pt on 6L [place] Airway entered and the following bronchi were examined: RUL, RML, RLL, LUL, LLL and Bronchi.   Bronchoscope removed.  Patient remained on 6L [place] t/o procedure.    Evaluation BP 113/74   Pulse (!) 120   Temp 98.1 F (36.7 C) (Oral)   Resp (!) 36   Ht _0  (1.905 m)   Wt 274 lb 7.6 oz (124.5 kg)   SpO2 96%   BMI 34.31 kg/m  Breath Sounds:Clear O2 sats: stable throughout Patient's Current Condition: stable Specimens:  BAL Complications: No apparent complications Patient did tolerate procedure well.   Kathie Dike 10/29/2017, 11:16 AM

## 2017-10-29 NOTE — Progress Notes (Signed)
Patient ID: Elijah Gillespie, male   DOB: 1971/06/05, 46 y.o.   MRN: [id number] TCTS DAILY ICU PROGRESS NOTE                   [address]            Elijah Gillespie,[place] [id number]          [phone]     Procedure(s) (LRB): VIDEO BRONCHOSCOPY (N/A) VIDEO ASSISTED THORACOSCOPY (VATS)/DECORTICATION (Left)  Total Length of Stay:  LOS: 1 day   Subjective: Overall patient's respiratory status is slightly improved, bronchoscopy was performed earlier today  Objective: Vital signs in last 24 hours: Temp:  [97.8 F (36.6 C)-99.6 F (37.6 C)] 98.6 F (37 C) (12/27 1549) Pulse Rate:  [112-154] 125 (12/27 1800) Cardiac Rhythm: Sinus tachycardia (12/27 1600) Resp:  [13-36] 23 (12/27 1800) BP: (104-156)/(54-113) 120/73 (12/27 1800) SpO2:  [88 %-98 %] 93 % (12/27 1800) FiO2 (%):  [30 %] 30 % (12/27 1605)  Filed Weights   10/27/17 2323 10/28/17 0500  Weight: 283 lb 7 oz (128.6 kg) 274 lb 7.6 oz (124.5 kg)    Weight change:    Hemodynamic parameters for last 24 hours:    Intake/Output from previous day: 12/26 0701 - 12/27 0700 In: 2622.6 [I.V.:2322.6; IV Piggyback:300] Out: 885 [Urine:425; Chest Tube:460]  Intake/Output this shift: Total I/O In: 994 [P.O.:444; IV Piggyback:550] Out: 100 [Chest Tube:100]  Current Meds: Scheduled Meds: . albuterol  2.5 mg Nebulization Q4H  . atorvastatin  20 mg Oral Daily  . chlorhexidine  15 mL Mouth Rinse BID  . citalopram  20 mg Oral Daily  . dextromethorphan-guaiFENesin  1 tablet Oral BID  . HYDROmorphone   Intravenous Q4H  . mouth rinse  15 mL Mouth Rinse q12n4p  . nicotine  21 mg Transdermal Daily  . pantoprazole  40 mg Oral Daily  . pneumococcal 23 valent vaccine  0.5 mL Intramuscular Tomorrow-1000  . sodium chloride HYPERTONIC  4 mL Nebulization Daily   Continuous Infusions: . piperacillin-tazobactam (ZOSYN)  IV 3.375 g (10/29/17 1616)  . vancomycin     PRN Meds:.acetaminophen, diphenhydrAMINE **OR** diphenhydrAMINE,  hydrALAZINE, levalbuterol, lidocaine (PF), naloxone **AND** sodium chloride flush, ondansetron (ZOFRAN) IV, oxyCODONE-acetaminophen, phenylephrine, zolpidem  General appearance: alert, cooperative and appears older than stated age Neurologic: intact Heart: regular rate and rhythm, S1, S2 normal, no murmur, click, rub or gallop Lungs: diminished breath sounds LLL Abdomen: soft, non-tender; bowel sounds normal; no masses,  no organomegaly Extremities: extremities normal, atraumatic, no cyanosis or edema Wound: No air leak from chest tube,   Lab Results: CBC: Recent Labs    10/28/17 2004 10/29/17 0309  WBC 35.9* 33.4*  HGB 11.6* 11.8*  HCT 36.8* 37.8*  PLT 359 335   BMET:  Recent Labs    10/27/17 2325 10/29/17 0309  NA 139 138  K 3.8 4.4  CL 101 101  CO2 29 28  GLUCOSE 148* 136*  BUN 11 15  CREATININE 0.96 0.77  CALCIUM 8.9 8.4*    CMET: Lab Results  Component Value Date   WBC 33.4 (H) 10/29/2017   HGB 11.8 (L) 10/29/2017   HCT 37.8 (L) 10/29/2017   PLT 335 10/29/2017   GLUCOSE 136 (H) 10/29/2017   NA 138 10/29/2017   K 4.4 10/29/2017   CL 101 10/29/2017   CREATININE 0.77 10/29/2017   BUN 15 10/29/2017   CO2 28 10/29/2017   TSH 2.115 10/29/2017   INR 1.24 10/28/2017  PT/INR:  Recent Labs    10/28/17 0551  LABPROT 15.5*  INR 1.24   Radiology: Dg Chest Port 1 View  Result Date: 10/29/2017 CLINICAL DATA:  Atelectasis, dyspnea EXAM: PORTABLE CHEST 1 VIEW COMPARISON:  Chest radiograph from earlier today. FINDINGS: Left basilar chest tube is stable in position. No pneumothorax. Minimally improved limited aeration of the left lung with near complete opacification of the left hemithorax and persistent volume loss in the left hemithorax. No mediastinal shift. No right pleural effusion. Right lung is clear. IMPRESSION: 1. Stable left basilar chest tube.  No pneumothorax. 2. Minimally improved limited aeration of the left lung with persistent near complete  opacification of and volume loss within the left hemithorax, compatible with a combination of loculated left pleural effusion and left lung atelectasis. Underlying mass or pneumonia in the left lung cannot be excluded. Electronically Signed   By: Delbert PhenixJason A Poff M.D.   On: 10/29/2017 12:08   Dg Chest Port 1 View  Result Date: 10/29/2017 CLINICAL DATA:  Left pleural effusion with chest tube drainage. EXAM: PORTABLE CHEST 1 VIEW COMPARISON:  Portable chest x-ray of October 28, 2017 FINDINGS: The right lung is well-expanded and clear. On the left minimal aerated lung is observed similar to that seen yesterday. The chest tube is in stable position at the lung base. The left heart border is completely obscured. The pulmonary vascularity on the right is clear. IMPRESSION: Persistent near-total opacification of the left hemithorax despite the presence of the left chest tube. No significant improvement since yesterday's study. Electronically Signed   By: David  SwazilandJordan M.D.   On: 10/29/2017 07:48   Dg Chest Port 1 View  Result Date: 10/28/2017 CLINICAL DATA:  Chest tube EXAM: PORTABLE CHEST 1 VIEW COMPARISON:  10/28/2017, 08/23/2017 FINDINGS: Right lung is grossly clear. Placement of left lower chest tube with tip projecting slightly to the left of the lower thoracic spine. Extensive opacity in the left thorax with slight improved aeration at the peripheral left lower lung. No pneumothorax. Slight shift of mediastinal contents to the left. IMPRESSION: Interval insertion of left lower chest to with slightly improved aeration of the left lower lateral lung. Extensive airspace disease an opacity within the left thorax remains. Electronically Signed   By: Jasmine PangKim  Fujinaga M.D.   On: 10/28/2017 19:31     Assessment/Plan: S/P Procedure(s) (LRB): VIDEO BRONCHOSCOPY (N/A) VIDEO ASSISTED THORACOSCOPY (VATS)/DECORTICATION (Left)  With little improvement from bronchoscopy, I have discussed with the patient and recommended  that we proceed with left video-assisted thoracoscopy to ensure adequate drainage of the left chest, the patient will need continued treatment for underlying pneumonia in addition.  Risks and options of surgery are discussed with the patient and he understands it potentially after surgery he would require to remain on a ventilator for period of time. The goals risks and alternatives of the planned surgical procedure Procedure(s): VIDEO BRONCHOSCOPY (N/A) VIDEO ASSISTED THORACOSCOPY (VATS)/DECORTICATION (Left)  have been discussed with the patient in detail. The risks of the procedure including death, infection, stroke, myocardial infarction, bleeding, blood transfusion have all been discussed specifically.  I have quoted Elijah Gillespie a 2% of perioperative mortality and a complication rate as high as 60 %. The patient's questions have been answered.Elijah FabianRichard Gillespie is willing  to proceed with the planned procedure.    Delight Ovensdward B Corneshia Hines 10/29/2017 6:27 PM

## 2017-10-29 NOTE — Procedures (Signed)
Bronchoscopy Procedure Note Elijah Gillespie [id number] Jun 06, 1971  Procedure: Bronchoscopy Indications: Diagnostic evaluation of the airways and Obtain specimens for culture and/or other diagnostic studies  Procedure Details Consent: Risks of procedure as well as the alternatives and risks of each were explained to the (patient/caregiver).  Consent for procedure obtained. Time Out: Verified patient identification, verified procedure, site/side was marked, verified correct patient position, special equipment/implants available, medications/allergies/relevent history reviewed, required imaging and test results available.  Performed  In preparation for procedure, patient was given 100% FiO2 and bronchoscope lubricated. Sedation: Benzodiazepines and Fentanyl  Airway entered and the following bronchi were examined: RUL, RML, RLL, LUL, LLL and Bronchi.   BAL from LLL Bronchoscope removed.    Evaluation Hemodynamic Status: BP stable throughout; O2 sats: stable throughout Patient's Current Condition: stable Specimens:  Sent purulent fluid Complications: No apparent complications Patient did tolerate procedure well.   Jennet Maduro 10/29/2017
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
## 2017-10-30 ENCOUNTER — Inpatient Hospital Stay (HOSPITAL_COMMUNITY): Payer: Medicaid Other

## 2017-10-30 ENCOUNTER — Inpatient Hospital Stay (HOSPITAL_COMMUNITY): Payer: Medicaid Other | Admitting: Certified Registered"

## 2017-10-30 ENCOUNTER — Encounter (HOSPITAL_COMMUNITY)
Admission: EM | Disposition: A | Discharge status: Home / Self Care | Payer: Self-pay | Source: Home / Self Care | Attending: Internal Medicine

## 2017-10-30 DIAGNOSIS — J869 Pyothorax without fistula: Secondary | ICD-10-CM

## 2017-10-30 DIAGNOSIS — G9340 Encephalopathy, unspecified: Secondary | ICD-10-CM

## 2017-10-30 HISTORY — PX: VIDEO BRONCHOSCOPY: SHX5072

## 2017-10-30 HISTORY — PX: VIDEO ASSISTED THORACOSCOPY (VATS)/DECORTICATION: SHX6171

## 2017-10-30 LAB — POCT I-STAT 3, ART BLOOD GAS (G3+)
ACID-BASE EXCESS: 6 mmol/L — AB (ref 0.0–2.0)
BICARBONATE: 31.6 mmol/L — AB (ref 20.0–28.0)
O2 Saturation: 100 %
PCO2 ART: 50 mmHg — AB (ref 32.0–48.0)
PO2 ART: 172 mmHg — AB (ref 83.0–108.0)
TCO2: 33 mmol/L — ABNORMAL HIGH (ref 22–32)
pH, Arterial: 7.409 (ref 7.350–7.450)

## 2017-10-30 LAB — CBC
HEMATOCRIT: 35.7 % — AB (ref 39.0–52.0)
HEMOGLOBIN: 10.8 g/dL — AB (ref 13.0–17.0)
MCH: 26.5 pg (ref 26.0–34.0)
MCHC: 30.3 g/dL (ref 30.0–36.0)
MCV: 87.7 fL (ref 78.0–100.0)
Platelets: 348 10*3/uL (ref 150–400)
RBC: 4.07 MIL/uL — AB (ref 4.22–5.81)
RDW: 15.8 % — ABNORMAL HIGH (ref 11.5–15.5)
WBC: 21.8 10*3/uL — ABNORMAL HIGH (ref 4.0–10.5)

## 2017-10-30 LAB — GLUCOSE, CAPILLARY
GLUCOSE-CAPILLARY: 158 mg/dL — AB (ref 65–99)
GLUCOSE-CAPILLARY: 146 mg/dL — AB (ref 65–99)
Glucose-Capillary: 144 mg/dL — ABNORMAL HIGH (ref 65–99)
Glucose-Capillary: 162 mg/dL — ABNORMAL HIGH (ref 65–99)

## 2017-10-30 LAB — LEGIONELLA PNEUMOPHILA SEROGP 1 UR AG: L. pneumophila Serogp 1 Ur Ag: NEGATIVE

## 2017-10-30 LAB — URINALYSIS, ROUTINE W REFLEX MICROSCOPIC
Bacteria, UA: NONE SEEN
Bilirubin Urine: NEGATIVE
Glucose, UA: NEGATIVE mg/dL
Hgb urine dipstick: NEGATIVE
Ketones, ur: NEGATIVE mg/dL
Leukocytes, UA: NEGATIVE
Nitrite: NEGATIVE
Protein, ur: 30 mg/dL — AB
Specific Gravity, Urine: 1.034 — ABNORMAL HIGH (ref 1.005–1.030)
pH: 5 (ref 5.0–8.0)

## 2017-10-30 LAB — BASIC METABOLIC PANEL
ANION GAP: 6 (ref 5–15)
BUN: 14 mg/dL (ref 6–20)
CALCIUM: 8.5 mg/dL — AB (ref 8.9–10.3)
CHLORIDE: 99 mmol/L — AB (ref 101–111)
CO2: 31 mmol/L (ref 22–32)
Creatinine, Ser: 0.77 mg/dL (ref 0.61–1.24)
GFR calc non Af Amer: >60 mL/min (ref >60)
Glucose, Bld: 113 mg/dL — ABNORMAL HIGH (ref 65–99)
POTASSIUM: 3.7 mmol/L (ref 3.5–5.1)
Sodium: 136 mmol/L (ref 135–145)

## 2017-10-30 LAB — TRIGLYCERIDES: Triglycerides: 143 mg/dL (ref <150)

## 2017-10-30 LAB — MAGNESIUM: MAGNESIUM: 1.9 mg/dL (ref 1.7–2.4)

## 2017-10-30 LAB — PHOSPHORUS: Phosphorus: 2.8 mg/dL (ref 2.5–4.6)

## 2017-10-30 SURGERY — BRONCHOSCOPY, VIDEO-ASSISTED
Anesthesia: General | Site: Chest

## 2017-10-30 MED ORDER — LACTATED RINGERS IV SOLN
INTRAVENOUS | Status: DC | PRN
  Administered 2017-10-30: 07:00:00 via INTRAVENOUS

## 2017-10-30 MED ORDER — FENTANYL CITRATE (PF) 100 MCG/2ML IJ SOLN
INTRAMUSCULAR | Status: DC | PRN
  Administered 2017-10-30: 50 ug via INTRAVENOUS
  Administered 2017-10-30: 100 ug via INTRAVENOUS
  Administered 2017-10-30 (×2): 50 ug via INTRAVENOUS
  Administered 2017-10-30 (×2): 100 ug via INTRAVENOUS

## 2017-10-30 MED ORDER — 0.9 % SODIUM CHLORIDE (POUR BTL) OPTIME
TOPICAL | Status: DC | PRN
  Administered 2017-10-30: 4000 mL

## 2017-10-30 MED ORDER — DOCUSATE SODIUM 50 MG/5ML PO LIQD
50.0000 mg | Freq: Every day | ORAL | Status: DC
  Administered 2017-10-30 – 2017-11-08 (×4): 50 mg
  Filled 2017-10-30 (×7): qty 10

## 2017-10-30 MED ORDER — SUCCINYLCHOLINE CHLORIDE 200 MG/10ML IV SOSY
PREFILLED_SYRINGE | INTRAVENOUS | Status: DC | PRN
  Administered 2017-10-30: 120 mg via INTRAVENOUS

## 2017-10-30 MED ORDER — DEXTROSE-NACL 5-0.45 % IV SOLN
INTRAVENOUS | Status: DC
Start: 2017-10-30 — End: 2017-10-31
  Administered 2017-10-30 (×2): via INTRAVENOUS

## 2017-10-30 MED ORDER — DIPHENHYDRAMINE HCL 12.5 MG/5ML PO ELIX
12.5000 mg | ORAL_SOLUTION | Freq: Four times a day (QID) | ORAL | Status: DC | PRN
  Filled 2017-10-30: qty 5

## 2017-10-30 MED ORDER — CITALOPRAM HYDROBROMIDE 20 MG PO TABS
20.0000 mg | ORAL_TABLET | Freq: Every day | ORAL | Status: DC
  Administered 2017-10-31 – 2017-11-09 (×10): 20 mg via ORAL
  Filled 2017-10-30 (×10): qty 1

## 2017-10-30 MED ORDER — NALOXONE HCL 0.4 MG/ML IJ SOLN: 0.4000 mg | INTRAMUSCULAR | Status: DC | PRN

## 2017-10-30 MED ORDER — NICOTINE 21 MG/24HR TD PT24
21.0000 mg | MEDICATED_PATCH | Freq: Every day | TRANSDERMAL | Status: DC
  Filled 2017-10-30 (×8): qty 1

## 2017-10-30 MED ORDER — FENTANYL CITRATE (PF) 2500 MCG/50ML IJ SOLN
25.0000 ug/h | INTRAMUSCULAR | Status: DC
  Administered 2017-10-30: 300 ug/h via INTRAVENOUS
  Administered 2017-10-30: 50 ug/h via INTRAVENOUS
  Administered 2017-10-31: 300 ug/h via INTRAVENOUS
  Filled 2017-10-30 (×3): qty 50

## 2017-10-30 MED ORDER — PROPOFOL 1000 MG/100ML IV EMUL
0.0000 ug/kg/min | INTRAVENOUS | Status: DC
  Administered 2017-10-30: 50 ug/kg/min via INTRAVENOUS
  Administered 2017-10-30: 40 ug/kg/min via INTRAVENOUS
  Administered 2017-10-30 (×2): 50 ug/kg/min via INTRAVENOUS
  Administered 2017-10-30: 40 ug/kg/min via INTRAVENOUS
  Administered 2017-10-31: 30 ug/kg/min via INTRAVENOUS
  Administered 2017-10-31: 40 ug/kg/min via INTRAVENOUS
  Filled 2017-10-30 (×7): qty 100

## 2017-10-30 MED ORDER — FENTANYL BOLUS VIA INFUSION
50.0000 ug | INTRAVENOUS | Status: DC | PRN
  Administered 2017-10-30: 50 ug via INTRAVENOUS
  Filled 2017-10-30: qty 50

## 2017-10-30 MED ORDER — ENOXAPARIN SODIUM 40 MG/0.4ML ~~LOC~~ SOLN
40.0000 mg | Freq: Every day | SUBCUTANEOUS | Status: DC
  Administered 2017-10-31 – 2017-11-02 (×3): 40 mg via SUBCUTANEOUS
  Filled 2017-10-30 (×4): qty 0.4

## 2017-10-30 MED ORDER — FENTANYL CITRATE (PF) 250 MCG/5ML IJ SOLN
INTRAMUSCULAR | Status: AC
  Filled 2017-10-30: qty 5

## 2017-10-30 MED ORDER — SENNOSIDES 8.8 MG/5ML PO SYRP
5.0000 mL | ORAL_SOLUTION | Freq: Every day | ORAL | Status: DC
  Administered 2017-10-30 – 2017-11-08 (×4): 5 mL
  Filled 2017-10-30 (×8): qty 5

## 2017-10-30 MED ORDER — ACETAMINOPHEN 160 MG/5ML PO SOLN
1000.0000 mg | Freq: Four times a day (QID) | ORAL | Status: AC
  Administered 2017-10-30 – 2017-10-31 (×5): 1000 mg via ORAL
  Filled 2017-10-30 (×14): qty 40.6

## 2017-10-30 MED ORDER — PROPOFOL 10 MG/ML IV BOLUS
INTRAVENOUS | Status: DC | PRN
  Administered 2017-10-30: 50 mg via INTRAVENOUS
  Administered 2017-10-30: 150 mg via INTRAVENOUS

## 2017-10-30 MED ORDER — ONDANSETRON HCL 4 MG/2ML IJ SOLN
INTRAMUSCULAR | Status: DC | PRN
  Administered 2017-10-30: 4 mg via INTRAVENOUS

## 2017-10-30 MED ORDER — PROPOFOL 10 MG/ML IV BOLUS
INTRAVENOUS | Status: AC
  Filled 2017-10-30: qty 40

## 2017-10-30 MED ORDER — FENTANYL CITRATE (PF) 100 MCG/2ML IJ SOLN: 25.0000 ug | INTRAMUSCULAR | Status: DC | PRN

## 2017-10-30 MED ORDER — ATORVASTATIN CALCIUM 20 MG PO TABS
20.0000 mg | ORAL_TABLET | Freq: Every day | ORAL | Status: DC
  Administered 2017-10-31 – 2017-11-08 (×9): 20 mg via ORAL
  Filled 2017-10-30 (×10): qty 1

## 2017-10-30 MED ORDER — ORAL CARE MOUTH RINSE
15.0000 mL | Freq: Two times a day (BID) | OROMUCOSAL | Status: DC
  Administered 2017-10-30: 15 mL via OROMUCOSAL

## 2017-10-30 MED ORDER — CHLORHEXIDINE GLUCONATE 0.12% ORAL RINSE (MEDLINE KIT)
15.0000 mL | Freq: Two times a day (BID) | OROMUCOSAL | Status: DC
  Administered 2017-10-30 – 2017-10-31 (×2): 15 mL via OROMUCOSAL

## 2017-10-30 MED ORDER — ONDANSETRON HCL 4 MG/2ML IJ SOLN: 4.0000 mg | Freq: Four times a day (QID) | INTRAMUSCULAR | Status: DC | PRN

## 2017-10-30 MED ORDER — DIPHENHYDRAMINE HCL 50 MG/ML IJ SOLN: 12.5000 mg | Freq: Four times a day (QID) | INTRAMUSCULAR | Status: DC | PRN

## 2017-10-30 MED ORDER — ROCURONIUM BROMIDE 10 MG/ML (PF) SYRINGE
PREFILLED_SYRINGE | INTRAVENOUS | Status: DC | PRN
  Administered 2017-10-30: 80 mg via INTRAVENOUS
  Administered 2017-10-30 (×2): 50 mg via INTRAVENOUS
  Administered 2017-10-30: 30 mg via INTRAVENOUS

## 2017-10-30 MED ORDER — OXYCODONE HCL 5 MG PO TABS
5.0000 mg | ORAL_TABLET | ORAL | Status: DC | PRN
  Administered 2017-10-31 – 2017-11-04 (×20): 10 mg via ORAL
  Administered 2017-11-04: 5 mg via ORAL
  Administered 2017-11-04 – 2017-11-08 (×19): 10 mg via ORAL
  Administered 2017-11-08: 5 mg via ORAL
  Filled 2017-10-30 (×42): qty 2

## 2017-10-30 MED ORDER — SODIUM CHLORIDE 0.9% FLUSH: 9.0000 mL | INTRAVENOUS | Status: DC | PRN

## 2017-10-30 MED ORDER — HYDROMORPHONE HCL 1 MG/ML IJ SOLN
INTRAMUSCULAR | Status: AC
  Filled 2017-10-30: qty 0.5

## 2017-10-30 MED ORDER — PROPOFOL 500 MG/50ML IV EMUL
INTRAVENOUS | Status: DC | PRN
  Administered 2017-10-30: 75 ug/kg/min via INTRAVENOUS

## 2017-10-30 MED ORDER — LIDOCAINE 2% (20 MG/ML) 5 ML SYRINGE
INTRAMUSCULAR | Status: DC | PRN
  Administered 2017-10-30: 100 mg via INTRAVENOUS

## 2017-10-30 MED ORDER — FENTANYL CITRATE (PF) 100 MCG/2ML IJ SOLN
50.0000 ug | Freq: Once | INTRAMUSCULAR | Status: AC
  Administered 2017-10-30: 50 ug via INTRAVENOUS

## 2017-10-30 MED ORDER — ACETAMINOPHEN 500 MG PO TABS
1000.0000 mg | ORAL_TABLET | Freq: Four times a day (QID) | ORAL | Status: AC
  Administered 2017-10-31 – 2017-11-04 (×14): 1000 mg via ORAL
  Filled 2017-10-30 (×21): qty 2

## 2017-10-30 MED ORDER — DEXAMETHASONE SODIUM PHOSPHATE 10 MG/ML IJ SOLN
INTRAMUSCULAR | Status: DC | PRN
  Administered 2017-10-30: 10 mg via INTRAVENOUS

## 2017-10-30 MED ORDER — MIDAZOLAM HCL 2 MG/2ML IJ SOLN
INTRAMUSCULAR | Status: AC
  Filled 2017-10-30: qty 2

## 2017-10-30 MED ORDER — POTASSIUM CHLORIDE 10 MEQ/50ML IV SOLN: 10.0000 meq | Freq: Every day | INTRAVENOUS | Status: DC | PRN

## 2017-10-30 MED ORDER — SENNOSIDES-DOCUSATE SODIUM 8.6-50 MG PO TABS
1.0000 | ORAL_TABLET | Freq: Every day | ORAL | Status: DC
  Filled 2017-10-30: qty 1

## 2017-10-30 MED ORDER — FENTANYL 40 MCG/ML IV SOLN: INTRAVENOUS | Status: DC

## 2017-10-30 MED ORDER — VITAL HIGH PROTEIN PO LIQD
1000.0000 mL | ORAL | Status: DC
  Administered 2017-10-30: 1000 mL

## 2017-10-30 MED ORDER — ALBUTEROL SULFATE (2.5 MG/3ML) 0.083% IN NEBU
2.5000 mg | INHALATION_SOLUTION | RESPIRATORY_TRACT | Status: DC
  Administered 2017-10-31 (×2): 2.5 mg via RESPIRATORY_TRACT
  Filled 2017-10-30 (×2): qty 3

## 2017-10-30 MED ORDER — ORAL CARE MOUTH RINSE
15.0000 mL | Freq: Four times a day (QID) | OROMUCOSAL | Status: DC
  Administered 2017-10-31 (×3): 15 mL via OROMUCOSAL

## 2017-10-30 MED ORDER — BISACODYL 5 MG PO TBEC
10.0000 mg | DELAYED_RELEASE_TABLET | Freq: Every day | ORAL | Status: DC
  Administered 2017-10-31 – 2017-11-09 (×4): 10 mg via ORAL
  Filled 2017-10-30 (×7): qty 2

## 2017-10-30 MED ORDER — TRAMADOL HCL 50 MG PO TABS
50.0000 mg | ORAL_TABLET | Freq: Four times a day (QID) | ORAL | Status: DC | PRN
  Administered 2017-10-31 – 2017-11-06 (×5): 100 mg via ORAL
  Filled 2017-10-30 (×7): qty 2

## 2017-10-30 MED ORDER — MIDAZOLAM HCL 5 MG/5ML IJ SOLN
INTRAMUSCULAR | Status: DC | PRN
  Administered 2017-10-30: 2 mg via INTRAVENOUS

## 2017-10-30 MED ORDER — INSULIN ASPART 100 UNIT/ML ~~LOC~~ SOLN
0.0000 [IU] | SUBCUTANEOUS | Status: DC
  Administered 2017-10-30: 4 [IU] via SUBCUTANEOUS
  Administered 2017-10-30 – 2017-11-01 (×5): 2 [IU] via SUBCUTANEOUS

## 2017-10-30 MED ORDER — HYDROMORPHONE HCL 1 MG/ML IJ SOLN
INTRAMUSCULAR | Status: DC | PRN
  Administered 2017-10-30 (×2): 0.5 mg via INTRAVENOUS

## 2017-10-30 MED ORDER — PRO-STAT SUGAR FREE PO LIQD
60.0000 mL | Freq: Every day | ORAL | Status: DC
  Administered 2017-10-30 – 2017-10-31 (×4): 60 mL
  Filled 2017-10-30 (×7): qty 60

## 2017-10-30 SURGICAL SUPPLY — 84 items
APPLICATOR TIP COSEAL (VASCULAR PRODUCTS) IMPLANT
APPLICATOR TIP EXT COSEAL (VASCULAR PRODUCTS) IMPLANT
BLADE SURG 11 STRL SS (BLADE) IMPLANT
BRUSH CYTOL CELLEBRITY 1.5X140 (MISCELLANEOUS) IMPLANT
CANISTER SUCT 3000ML PPV (MISCELLANEOUS) ×3 IMPLANT
CATH KIT ON Q 5IN SLV (PAIN MANAGEMENT) IMPLANT
CATH THORACIC 28FR (CATHETERS) ×3 IMPLANT
CATH THORACIC 36FR (CATHETERS) IMPLANT
CATH THORACIC 36FR RT ANG (CATHETERS) IMPLANT
CLEANER TIP ELECTROSURG 2X2 (MISCELLANEOUS) IMPLANT
CLIP VESOCCLUDE MED 6/CT (CLIP) IMPLANT
CONN ST 1/4X3/8  BEN (MISCELLANEOUS) ×2
CONN ST 1/4X3/8 BEN (MISCELLANEOUS) ×4 IMPLANT
CONN Y 3/8X3/8X3/8  BEN (MISCELLANEOUS)
CONN Y 3/8X3/8X3/8 BEN (MISCELLANEOUS) IMPLANT
CONT SPEC 4OZ CLIKSEAL STRL BL (MISCELLANEOUS) ×6 IMPLANT
COVER BACK TABLE 60X90IN (DRAPES) IMPLANT
COVER SURGICAL LIGHT HANDLE (MISCELLANEOUS) IMPLANT
DERMABOND ADVANCED (GAUZE/BANDAGES/DRESSINGS) ×1
DERMABOND ADVANCED .7 DNX12 (GAUZE/BANDAGES/DRESSINGS) ×2 IMPLANT
DRAIN CHANNEL 28F RND 3/8 FF (WOUND CARE) IMPLANT
DRAIN CHANNEL 32F RND 10.7 FF (WOUND CARE) ×6 IMPLANT
DRAPE LAPAROSCOPIC ABDOMINAL (DRAPES) ×3 IMPLANT
DRAPE WARM FLUID 44X44 (DRAPE) IMPLANT
DRILL BIT 7/64X5 (BIT) IMPLANT
DRSG AQUACEL AG ADV 3.5X 6 (GAUZE/BANDAGES/DRESSINGS) ×3 IMPLANT
ELECT BLADE 4.0 EZ CLEAN MEGAD (MISCELLANEOUS) ×3
ELECT REM PT RETURN 9FT ADLT (ELECTROSURGICAL) ×3
ELECTRODE BLDE 4.0 EZ CLN MEGD (MISCELLANEOUS) ×2 IMPLANT
ELECTRODE REM PT RTRN 9FT ADLT (ELECTROSURGICAL) ×2 IMPLANT
FORCEPS BIOP RJ4 1.8 (CUTTING FORCEPS) IMPLANT
GAUZE SPONGE 4X4 12PLY STRL (GAUZE/BANDAGES/DRESSINGS) ×3 IMPLANT
GLOVE BIO SURGEON STRL SZ 6.5 (GLOVE) ×12 IMPLANT
GOWN STRL REUS W/ TWL LRG LVL3 (GOWN DISPOSABLE) ×8 IMPLANT
GOWN STRL REUS W/TWL LRG LVL3 (GOWN DISPOSABLE) ×4
KIT BASIN OR (CUSTOM PROCEDURE TRAY) ×3 IMPLANT
KIT CLEAN ENDO COMPLIANCE (KITS) IMPLANT
KIT ROOM TURNOVER OR (KITS) ×3 IMPLANT
KIT SUCTION CATH 14FR (SUCTIONS) ×3 IMPLANT
MARKER SKIN DUAL TIP RULER LAB (MISCELLANEOUS) IMPLANT
NS IRRIG 1000ML POUR BTL (IV SOLUTION) ×12 IMPLANT
OIL SILICONE PENTAX (PARTS (SERVICE/REPAIRS)) IMPLANT
PACK CHEST (CUSTOM PROCEDURE TRAY) ×3 IMPLANT
PAD ARMBOARD 7.5X6 YLW CONV (MISCELLANEOUS) ×6 IMPLANT
PASSER SUT SWANSON 36MM LOOP (INSTRUMENTS) IMPLANT
SCISSORS LAP 5X35 DISP (ENDOMECHANICALS) IMPLANT
SEALANT PROGEL (MISCELLANEOUS) IMPLANT
SEALANT SURG COSEAL 4ML (VASCULAR PRODUCTS) IMPLANT
SEALANT SURG COSEAL 8ML (VASCULAR PRODUCTS) IMPLANT
SOLUTION ANTI FOG 6CC (MISCELLANEOUS) ×3 IMPLANT
SUT ETHILON 3 0 FSL (SUTURE) ×3 IMPLANT
SUT PROLENE 3 0 SH DA (SUTURE) IMPLANT
SUT PROLENE 4 0 RB 1 (SUTURE)
SUT PROLENE 4-0 RB1 .5 CRCL 36 (SUTURE) IMPLANT
SUT SILK  1 MH (SUTURE) ×6
SUT SILK 1 MH (SUTURE) ×12 IMPLANT
SUT SILK 1 TIES 10X30 (SUTURE) IMPLANT
SUT SILK 2 0SH CR/8 30 (SUTURE) IMPLANT
SUT SILK 3 0SH CR/8 30 (SUTURE) IMPLANT
SUT VIC AB 0 CT1 18XCR BRD 8 (SUTURE) ×2 IMPLANT
SUT VIC AB 0 CT1 8-18 (SUTURE) ×1
SUT VIC AB 1 CTX 18 (SUTURE) IMPLANT
SUT VIC AB 1 CTX 36 (SUTURE) ×1
SUT VIC AB 1 CTX36XBRD ANBCTR (SUTURE) ×2 IMPLANT
SUT VIC AB 2-0 CTX 36 (SUTURE) ×3 IMPLANT
SUT VIC AB 3-0 X1 27 (SUTURE) ×3 IMPLANT
SUT VICRYL 0 UR6 27IN ABS (SUTURE) IMPLANT
SUT VICRYL 2 TP 1 (SUTURE) IMPLANT
SWAB COLLECTION DEVICE MRSA (MISCELLANEOUS) ×3 IMPLANT
SWAB CULTURE ESWAB REG 1ML (MISCELLANEOUS) ×3 IMPLANT
SWAB CULTURE LIQ STUART DBL (MISCELLANEOUS) IMPLANT
SYR 20ML ECCENTRIC (SYRINGE) ×3 IMPLANT
SYSTEM SAHARA CHEST DRAIN ATS (WOUND CARE) ×3 IMPLANT
TAPE CLOTH 4X10 WHT NS (GAUZE/BANDAGES/DRESSINGS) ×3 IMPLANT
TAPE UMBILICAL COTTON 1/8X30 (MISCELLANEOUS) IMPLANT
TIP APPLICATOR SPRAY EXTEND 16 (VASCULAR PRODUCTS) IMPLANT
TOWEL GREEN STERILE (TOWEL DISPOSABLE) ×3 IMPLANT
TOWEL GREEN STERILE FF (TOWEL DISPOSABLE) ×3 IMPLANT
TRAP SPECIMEN MUCOUS 40CC (MISCELLANEOUS) ×3 IMPLANT
TRAY FOLEY CATH SILVER 16FR LF (SET/KITS/TRAYS/PACK) ×3 IMPLANT
TROCAR BLADELESS 12MM (ENDOMECHANICALS) IMPLANT
TUBE CONNECTING 20X1/4 (TUBING) ×3 IMPLANT
TUNNELER SHEATH ON-Q 11GX8 DSP (PAIN MANAGEMENT) IMPLANT
WATER STERILE IRR 1000ML POUR (IV SOLUTION) ×6 IMPLANT

## 2017-10-30 NOTE — Progress Notes (Signed)
Patient performed therapep post HHN treatment for 10 breaths with good attempts.

## 2017-10-30 NOTE — Transfer of Care (Signed)
Immediate Anesthesia Transfer of Care Note  Patient: Elijah Gillespie  Procedure(s) Performed: VIDEO BRONCHOSCOPY (N/A ) LEFT VIDEO ASSISTED THORACOSCOPY (VATS)/ MINI THORACOTOMY/ DECORTICATION (Left Chest)  Patient Location: ICU  Anesthesia Type:General  Level of Consciousness: sedated and Patient remains intubated per anesthesia plan  Airway & Oxygen Therapy: Patient remains intubated per anesthesia plan and Patient placed on Ventilator (see vital sign flow sheet for setting)  Post-op Assessment: Report given to RN and Post -op Vital signs reviewed and stable  Post vital signs: Reviewed and stable  Last Vitals:  Vitals:   10/30/17 0552 10/30/17 0556  BP:    Pulse:    Resp: (!) 28 (!) 28  Temp:    SpO2: 95% 96%    Last Pain:  Vitals:   10/30/17 0556  TempSrc:   PainSc: 8       Patients Stated Pain Goal: 2 (10/29/17 0800)  Complications: No apparent anesthesia complications

## 2017-10-30 NOTE — Progress Notes (Signed)
Patient performed thera-pep for 10 breaths with good effort. Pt tolerated well.

## 2017-10-30 NOTE — Progress Notes (Signed)
Provided education and answered questions on expected postoperative VATS care. Educated on importance of IS and use of lung pillow, early ambulation, and pain management. Explained that he will come back with foley, central line, chest tubes, arterial line, and possible ventilator. Answered Mrs. Oda CoganLinville's questions regarding visitation in 2H when he gets back from surgery. Provided lung pillow. Told them to have his RN call me if they think of any further questions. Thresa RossA. Haggard RN

## 2017-10-30 NOTE — Progress Notes (Addendum)
Pt transported to OR by transporter and this RN via cardiac monitor, oxygen 6 liter [place], scd sleeves and PIVS x 2. Left pleural ct intact -20cm, drsg cdi. Pt diaphoretic intermittently(baseline), no change in pt respiratory status. CRNA Lafonda Mossesiana disconnected dilaudid PCA pump from pt. CRNA reconnected chest tube to wall suction. All CRNA questions addressed and answered by this RN. Pt wife and multiple family members directed to surgical waiting room by this RN.

## 2017-10-30 NOTE — Progress Notes (Signed)
Name: Elijah Gillespie MRN: [id number] DOB: May 22, 1971    ADMISSION DATE:  10/27/2017 CONSULTATION DATE:  10/28/17  REFERRING MD :  Algis Liming  CHIEF COMPLAINT:  SOB   HISTORY OF PRESENT ILLNESS:  Elijah Gillespie is a 46 y.o. male with a PMH as outlined below.  He presented to [facility] ED 12/26 early AM due to SOB and intermittent cough.  He apparently had a manubrium  fx 2 months prior after an accident while changing the oil in a vehicle.  Since then, symptoms have gradually progressed. Also has had some left sided chest discomfort, 10/10 severity, described as sharp and pleuritic that is aggravated by deep breathing and coughing. He has not had any fevers or chills but has had sweats.  In ED, he had CTA that was neg for PE but showed a left sided pleural effusion.  He was admitted to SDU by [facility] and was started on BiPAP for work of breathing.  AM 12/26, PCCM was called and asked to see in consultation.  SUBJECTIVE:  Sedated and intubated now post op for a VATS  VITAL SIGNS: Temp:  [98.4 F (36.9 C)-99.2 F (37.3 C)] 98.4 F (36.9 C) (12/28 0322) Pulse Rate:  [111-154] 118 (12/28 0500) Resp:  [13-34] 28 (12/28 0556) BP: (104-157)/(70-104) 125/83 (12/28 0500) SpO2:  [88 %-98 %] 96 % (12/28 0556) FiO2 (%):  [30 %] 30 % (12/27 1605)  PRVC  PHYSICAL EXAMINATION: General: Acutely ill appearing, sedated and paralyzed HEENT: Daniel/AT, PERRL, EOM-I and MMM Pulmonary: Coarse BS on the left 3 chest tubes in place.  Clear on the right Cardiac: RRR, Nl S1/S2 and -M/R/G Abdomen: Soft, NT, ND and +BS Extremities/musculoskeletal: Strong, no edema, brisk cap refill, good pulses. Neuro/psych: Sedated and paralyzed  Recent Labs  Lab 10/29/17 0309 10/29/17 1916 10/30/17 0316  NA 138 133* 136  K 4.4 3.9 3.7  CL 101 100* 99*  CO2 _0 BUN _1 CREATININE 0.77 0.71 0.77  GLUCOSE 136* 155* 113*   Recent Labs  Lab 10/29/17 0309 10/29/17 1916 10/30/17 0316  HGB 11.8* 10.7* 10.8*   HCT 37.8* 35.2* 35.7*  WBC 33.4* 23.6* 21.8*  PLT 335 355 348   Dg Chest 1 View  Result Date: 10/28/2017 CLINICAL DATA:  Followup left thoracentesis. EXAM: CHEST 1 VIEW COMPARISON:  CT same day.  Radiography 10/27/2017. FINDINGS: Newly seen complete opacification of the left hemithorax without mediastinal shift. This could be due to a combination of pleural fluid and pulmonary collapse. No pneumothorax. Right chest remains clear. IMPRESSION: Complete opacification of the left hemithorax without mediastinal shift. This could be due to a combination of pleural fluid and pulmonary collapse. Electronically Signed   By: Nelson Chimes M.D.   On: 10/28/2017 16:18   Dg Chest 2 View  Result Date: 10/30/2017 CLINICAL DATA:  Preoperative chest radiograph for VATS. EXAM: CHEST  2 VIEW COMPARISON:  Chest radiograph performed earlier today at 11:40 a.m., and CT of the chest performed 10/28/2017 FINDINGS: The patient's complex large left-sided pleural effusion is again noted, with underlying airspace consolidation. A left basilar chest tube is noted. The right lung appears relatively clear. No pneumothorax is seen. The heart is not well characterized due to opacification of the left hemithorax. No acute osseous abnormalities are seen. IMPRESSION: Complex large left-sided pleural effusion again noted, with underlying airspace consolidation. Electronically Signed   By: Garald Balding M.D.   On: 10/30/2017 01:01   Ct Chest Wo Contrast  Result Date:  10/28/2017 CLINICAL DATA:  Pleural effusion headache, pneumonia, or recent left chest trauma with healing left rib fractures. Status post thoracentesis. Post thoracentesis chest x-ray demonstrated complete opacification of the left chest. EXAM: CT CHEST WITHOUT CONTRAST TECHNIQUE: Multidetector CT imaging of the chest was performed following the standard protocol without IV contrast. COMPARISON:  10/28/2017 FINDINGS: Cardiovascular: Normal heart size. No pericardial  effusion patent. Minor thoracic aortic atherosclerosis. Mediastinum/Nodes: Right thyroid nodular enlargement as before measuring up to 5 cm. Recommend follow-up thyroid ultrasound outpatient. Trachea and esophagus unremarkable by noncontrast imaging. No adenopathy. Lungs/Pleura: Continued enlargement of the left effusion with developing loculation superiorly. This is despite having a left thoracentesis yielding 510 cc earlier today. Effusion has low Hounsfield units and does not appear to represent hemothorax. There is now near-complete collapse/consolidation of the left lung. Difficult to exclude central mucous plugging as well. Minor right base atelectasis.  No right effusion. Upper Abdomen: No significant or acute finding by noncontrast imaging Musculoskeletal: Healing posterior left rib fractures. No other acute osseous finding. IMPRESSION: Continued enlargement in the left effusion with developing loculation in the apex. Worsening near complete collapse / consolidation of the entire left lung. Difficult to exclude a component of central mucous plugging. Effusion remains low attenuation with Hounsfield measurements less than 15. No evidence of hemothorax following thoracentesis. These results were called by telephone at the time of interpretation on 10/28/2017 at 5:44 pm to Dr. Algis Liming, who verbally acknowledged these results. Electronically Signed   By: Jerilynn Mages.  Shick M.D.   On: 10/28/2017 17:46   Dg Chest Port 1 View  Result Date: 10/29/2017 CLINICAL DATA:  Atelectasis, dyspnea EXAM: PORTABLE CHEST 1 VIEW COMPARISON:  Chest radiograph from earlier today. FINDINGS: Left basilar chest tube is stable in position. No pneumothorax. Minimally improved limited aeration of the left lung with near complete opacification of the left hemithorax and persistent volume loss in the left hemithorax. No mediastinal shift. No right pleural effusion. Right lung is clear. IMPRESSION: 1. Stable left basilar chest tube.  No  pneumothorax. 2. Minimally improved limited aeration of the left lung with persistent near complete opacification of and volume loss within the left hemithorax, compatible with a combination of loculated left pleural effusion and left lung atelectasis. Underlying mass or pneumonia in the left lung cannot be excluded. Electronically Signed   By: Ilona Sorrel M.D.   On: 10/29/2017 12:08   Dg Chest Port 1 View  Result Date: 10/29/2017 CLINICAL DATA:  Left pleural effusion with chest tube drainage. EXAM: PORTABLE CHEST 1 VIEW COMPARISON:  Portable chest x-ray of October 28, 2017 FINDINGS: The right lung is well-expanded and clear. On the left minimal aerated lung is observed similar to that seen yesterday. The chest tube is in stable position at the lung base. The left heart border is completely obscured. The pulmonary vascularity on the right is clear. IMPRESSION: Persistent near-total opacification of the left hemithorax despite the presence of the left chest tube. No significant improvement since yesterday's study. Electronically Signed   By: David  Martinique M.D.   On: 10/29/2017 07:48   Dg Chest Port 1 View  Result Date: 10/28/2017 CLINICAL DATA:  Chest tube EXAM: PORTABLE CHEST 1 VIEW COMPARISON:  10/28/2017, 08/23/2017 FINDINGS: Right lung is grossly clear. Placement of left lower chest tube with tip projecting slightly to the left of the lower thoracic spine. Extensive opacity in the left thorax with slight improved aeration at the peripheral left lower lung. No pneumothorax. Slight shift of mediastinal contents to  the left. IMPRESSION: Interval insertion of left lower chest to with slightly improved aeration of the left lower lateral lung. Extensive airspace disease an opacity within the left thorax remains. Electronically Signed   By: Donavan Foil M.D.   On: 10/28/2017 19:31   Ir Thoracentesis Asp Pleural Space W/img Guide  Result Date: 10/28/2017 INDICATION: Recent left chest trauma, left  effusion, pneumonia, shortness of breath, fever, chest pain EXAM: ULTRASOUND GUIDED LEFT THORACENTESIS MEDICATIONS: 1% lidocaine local COMPLICATIONS: None immediate. PROCEDURE: An ultrasound guided thoracentesis was thoroughly discussed with the patient and questions answered. The benefits, risks, alternatives and complications were also discussed. The patient understands and wishes to proceed with the procedure. Written consent was obtained. Ultrasound was performed to localize and mark an adequate pocket of fluid in the left chest. The area was then prepped and draped in the normal sterile fashion. 1% Lidocaine was used for local anesthesia. Under ultrasound guidance a 6 Fr Safe-T-Centesis catheter was introduced. Thoracentesis was performed. The catheter was removed and a dressing applied. FINDINGS: A total of approximately 510 cc of exudative pleural fluid was removed. Samples were sent to the laboratory as requested by the clinical team. IMPRESSION: Successful ultrasound guided left thoracentesis yielding 510 cc of pleural fluid. Electronically Signed   By: Jerilynn Mages.  Shick M.D.   On: 10/28/2017 16:22    STUDIES:  CXR 12/26 > mild left basilar opacity vs atelectasis. CTA chest 12/26 > no PE, mod left effusion and left lung atx vs infiltrate.  Culture data Left pleural fluid Gram stain 12/26: Abundant WBCs no organisms Left pleural fluid culture 12/26: Abundant WBCs>>> Blood culture 12/26>>>> BAL 12/27>>>  Antibiotics: Azithromycin 12/25>>>12/27 Rocephin 12/25>>>12/27 Vancomycin 12/27>>> Zosyn 12/27>>>  Pleural fluid 12/26 LDH: 2054, fluid color: Yellow appearance: Cloudy white blood cell count 4950: Neutrophil count 88 Total pleural protein: 4.3, pleural glucose less than 20  SIGNIFICANT EVENTS  12/26 > admit, PCCM consult.  ASSESSMENT / PLAN:  Acute hypoxic respiratory failure in the setting of mucous plugging/atelectasis, pneumonia as well as moderate to large exudative pleural effusion:  Parapneumonic versus empyema Traumatic chest wall injury with subacute sternal fracture (approximately 2 months ago)  -He is status post left tube thoracostomy on 12/26 -Chest x-ray review on 12/27 demonstrates complete opacification of the left hemithorax, this is in spite of chest tube drainage -Preliminary Gram stain from pleural fluid demonstrates abundant white blood cells, but no organisms -All culture data negative to date -No significant fever, white blood cell count down a little Plan: Went to VATS this AM Cultures from VATS sent ABG now and adjust vent accordingly Follow-up culture data from VATS Vanc/zosyn Anticipate weaning in AM after pain is under control, will place order for SBT  Anemia without evidence of bleeding Plan Trend CBC Transfuse per protocol  Acute respiratory failure post op: management as above.   - Hold off weaning now  - Abx  - Titrate O2 for sat of 88-92%  Empyema:  - Vanc/zosyn  - F/u on culture  Neuro:  - Propofol drip  - Fentanyl drip  The patient is critically ill with multiple organ systems failure and requires high complexity decision making for assessment and support, frequent evaluation and titration of therapies, application of advanced monitoring technologies and extensive interpretation of multiple databases.   Critical Care Time devoted to patient care services described in this note is  35  Minutes. This time reflects time of care of this signee Dr Jennet Maduro. This critical care time does not  reflect procedure time, or teaching time or supervisory time of PA/NP/Med student/Med Resident etc but could involve care discussion time.  Rush Farmer, M.D. [facility] Pulmonary/Critical Care Medicine. Pager: [phone]. After hours pager: [phone].

## 2017-10-30 NOTE — Anesthesia Procedure Notes (Addendum)
Procedure Name: Intubation Date/Time: 10/30/2017 7:51 AM Performed by: Elliot DallyHuggins, Kijana Estock, CRNA Pre-anesthesia Checklist: Patient identified, Emergency Drugs available, Suction available and Patient being monitored Patient Re-evaluated:Patient Re-evaluated prior to induction Oxygen Delivery Method: Circle System Utilized Preoxygenation: Pre-oxygenation with 100% oxygen Induction Type: IV induction Ventilation: Two handed mask ventilation required and Oral airway inserted - appropriate to patient size Laryngoscope Size: Miller and 3 Grade View: Grade I Tube type: Oral Endobronchial tube: Left and Double lumen EBT and 37 Fr Number of attempts: 2 Airway Equipment and Method: Stylet,  Oral airway and Fiberoptic brochoscope Placement Confirmation: ETT inserted through vocal cords under direct vision,  positive ETCO2 and breath sounds checked- equal and bilateral Tube secured with: Tape Dental Injury: Teeth and Oropharynx as per pre-operative assessment  Comments: First DL with Glide scope blade 4. Grade 1 view, unable to pass DLT through cords. Second DL by Dr. Jacklynn BueMassagee with Hyacinth MeekerMiller 3, successful placement of ETT. Placement confirmed by bronchoscope.

## 2017-10-30 NOTE — Anesthesia Preprocedure Evaluation (Addendum)
Anesthesia Evaluation  Patient identified by MRN, date of birth, ID band Patient awake    Reviewed: Allergy & Precautions, NPO status , Patient's Chart, lab work & pertinent test results  Airway Mallampati: II  TM Distance: <3 FB Neck ROM: Full    Dental  (+) Teeth Intact   Pulmonary pneumonia, Current Smoker,  effusion  Pt is tachypneic  + rhonchi  + decreased breath sounds      Cardiovascular hypertension,  Rhythm:Regular Rate:Tachycardia     Neuro/Psych    GI/Hepatic Neg liver ROS, GERD  ,  Endo/Other  Morbid obesity  Renal/GU negative Renal ROS     Musculoskeletal   Abdominal (+) + obese,   Peds  Hematology   Anesthesia Other Findings   Reproductive/Obstetrics                            Anesthesia Physical Anesthesia Plan  ASA: III  Anesthesia Plan: General   Post-op Pain Management:    Induction: Intravenous  PONV Risk Score and Plan: 2 and Treatment may vary due to age or medical condition, Dexamethasone and Ondansetron  Airway Management Planned: Double Lumen EBT and Video Laryngoscope Planned  Additional Equipment: CVP, Arterial line and Ultrasound Guidance Line Placement  Intra-op Plan:   Post-operative Plan: Possible Post-op intubation/ventilation and Extubation in OR  Informed Consent: I have reviewed the patients History and Physical, chart, labs and discussed the procedure including the risks, benefits and alternatives for the proposed anesthesia with the patient or authorized representative who has indicated his/her understanding and acceptance.   Dental advisory given  Plan Discussed with: CRNA  Anesthesia Plan Comments:        Anesthesia Quick Evaluation

## 2017-10-30 NOTE — Progress Notes (Signed)
Pulled ET tube back 3 per MD 22 at lip

## 2017-10-30 NOTE — Anesthesia Postprocedure Evaluation (Signed)
Anesthesia Post Note  Patient: Elijah Gillespie  Procedure(s) Performed: VIDEO BRONCHOSCOPY (N/A ) LEFT VIDEO ASSISTED THORACOSCOPY (VATS)/ MINI THORACOTOMY/ DECORTICATION (Left Chest)     Patient location during evaluation: SICU Anesthesia Type: General Level of consciousness: sedated and patient remains intubated per anesthesia plan Pain management: pain level controlled Vital Signs Assessment: post-procedure vital signs reviewed and stable Respiratory status: patient remains intubated per anesthesia plan Cardiovascular status: stable Postop Assessment: no apparent nausea or vomiting Anesthetic complications: no    Last Vitals:  Vitals:   10/30/17 0556 10/30/17 1112  BP:    Pulse:    Resp: (!) 28   Temp:  37.3 C  SpO2: 96%     Last Pain:  Vitals:   10/30/17 1112  TempSrc: Oral  PainSc:                  Pietrina Jagodzinski,JAMES TERRILL

## 2017-10-30 NOTE — Progress Notes (Signed)
3 cc of Dilaudid 25mg /25 ml concentration wasted in sink, witnessed by nurse stephanie

## 2017-10-30 NOTE — Anesthesia Procedure Notes (Signed)
Arterial Line Insertion Start/End12/28/2018 7:07 AM, 10/30/2017 7:17 AM Performed by: Nils PyleBell, Sarah T, CRNA, CRNA  Patient location: Pre-op. Preanesthetic checklist: patient identified, IV checked, site marked, risks and benefits discussed, surgical consent, monitors and equipment checked, pre-op evaluation and anesthesia consent Patient sedated Right, radial was placed Catheter size: 20 G Hand hygiene performed  and maximum sterile barriers used   Attempts: 2 Procedure performed without using ultrasound guided technique. Following insertion, Biopatch and dressing applied. Post procedure assessment: normal  Patient tolerated the procedure well with no immediate complications.

## 2017-10-30 NOTE — Progress Notes (Signed)
Initial Nutrition Assessment  DOCUMENTATION CODES:   Obesity unspecified  INTERVENTION:   -Initiate TF via OGT with Vital High Protein at goal rate of 15 ml/h (360 ml per day) and Prostat 60 ml 5 times daily to provide 1360 kcals, 182 gm protein, 301 ml free water daily. With current rate of propofol, will provide 2347 kcals daily. Due to high propofol rate, unable to meet 100% of protein needs without exceeding estimated calorie goals.   NUTRITION DIAGNOSIS:   Inadequate oral intake related to inability to eat as evidenced by NPO status.  GOAL:   Provide needs based on ASPEN/SCCM guidelines  MONITOR:   Vent status, Labs, Weight trends, TF tolerance, Skin, I & O's  REASON FOR ASSESSMENT:   Consult, Ventilator Enteral/tube feeding initiation and management  ASSESSMENT:   Elijah Gillespie is a 46 y.o. male with a PMH as outlined below.  [facility] early AM due to SOB and intermittent cough.  He apparently had a manubrium  fx 2 months prior after an accident while changing the oil in a vehicle.  Since then, symptoms have gradually progressed.  Pt admitted wityh pulmonary infilatrate, LL CAP, and PNA with empyema.   12/26- lt chest tube inserted 12/27- s/p bronchoscopy 12/28- s/p VATS  Patient is currently intubated on ventilator support. OGT placement verified by KUB; tip of tube in stomach.  MV: 10.7 L/min Temp (24hrs), Avg:98.9 F (37.2 C), Min:98.4 F (36.9 C), Max:99.2 F (37.3 C)  Propofol: 37.4 ml/hr (987 kcals)  Unable to complete Nutrition-Focused physical exam at this time.   Labs reviewed: CBGS: 144.   Diet Order:  Diet NPO time specified  EDUCATION NEEDS:   Not appropriate for education at this time  Skin:  Skin Assessment: Skin Integrity Issues: Skin Integrity Issues:: Incisions Incisions: closed lt chest Other: n/a  Last BM:  10/27/17  Height:   Ht Readings from Last 1 Encounters:  10/27/17 6\' 3"  (1.905 m)    Weight:    Wt Readings from Last 1 Encounters:  10/30/17 280 lb 6.8 oz (127.2 kg)    Ideal Body Weight:  89.1 kg  BMI:  Body mass index is 35.05 kg/m.  Estimated Nutritional Needs:   Kcal:  [id number]  Protein:  > 178 grams  Fluid:  >1.4 L    Carrieanne Kleen A. Mayford KnifeWilliams, RD, LDN, CDE Pager: [phone] After hours Pager: [phone]

## 2017-10-30 NOTE — Brief Op Note (Addendum)
      [address]       [name] [id number]             [phone]      10/30/2017  10:09 AM  PATIENT:  Stormy Fabianichard Paddack  46 y.o. male  PRE-OPERATIVE DIAGNOSIS:  Pneumonia with Empyema  POST-OPERATIVE DIAGNOSIS:  Pneumonia with Empyema  PROCEDURE:  Procedure(s): VIDEO BRONCHOSCOPY (N/A) LEFT VIDEO ASSISTED THORACOSCOPY (VATS)/ MINI THORACOTOMY/ DECORTICATION (Left)  SURGEON:  Surgeon(s) and Role:    * Delight OvensGerhardt, Marik Sedore B, MD - Primary  PHYSICIAN ASSISTANT:  Jari Favreessa Conte, PA-C   ANESTHESIA:   general  EBL:  150 mL   BLOOD ADMINISTERED:none  DRAINS: ONE STRAIGHT CHEST TUBE AND TWO LARGE BLAKE DRAINS   LOCAL MEDICATIONS USED:  NONE  SPECIMEN:  Source of Specimen:  PLEURAL PEEL  DISPOSITION OF SPECIMEN:  PATHOLOGY  COUNTS:  YES  DICTATION: .Dragon Dictation  PLAN OF CARE: Admit to inpatient   PATIENT DISPOSITION:  ICU - intubated and hemodynamically stable.   Delay start of Pharmacological VTE agent (>24hrs) due to surgical blood loss or risk of bleeding: yes

## 2017-10-30 NOTE — Progress Notes (Signed)
      [address]       [name], [id number]             [phone]     Pre Procedure note for inpatients:   Elijah Gillespie has been scheduled for Procedure(s): VIDEO BRONCHOSCOPY (N/A) VIDEO ASSISTED THORACOSCOPY (VATS)/DECORTICATION (Left) today. The various methods of treatment have been discussed with the patient. After consideration of the risks, benefits and treatment options the patient has consented to the planned procedure.   The patient has been seen and labs reviewed.  Patient remains critically ill Diaphoretic tachypneic , patient and family are aware of the critical need to proceed with drainage of left empyema and that likely the patient will remain on the ventilator postop.  White count has dropped some to 21,000 Recent labs:  Lab Results  Component Value Date   WBC 21.8 (H) 10/30/2017   HGB 10.8 (L) 10/30/2017   HCT 35.7 (L) 10/30/2017   PLT 348 10/30/2017   GLUCOSE 113 (H) 10/30/2017   ALT 24 10/29/2017   AST 15 10/29/2017   NA 136 10/30/2017   K 3.7 10/30/2017   CL 99 (L) 10/30/2017   CREATININE 0.77 10/30/2017   BUN 14 10/30/2017   CO2 31 10/30/2017   TSH 2.115 10/29/2017   INR 1.17 10/29/2017    Delight OvensEdward B Sandee Bernath, MD 10/30/2017 7:26 AM

## 2017-10-30 NOTE — Anesthesia Procedure Notes (Addendum)
Central Venous Catheter Insertion Performed by: Sharee HolsterMassagee, Tishara Pizano, MD, anesthesiologist Start/End12/28/2018 6:55 AM, 10/30/2017 7:14 AM Patient location: Pre-op. Preanesthetic checklist: patient identified, IV checked, site marked, risks and benefits discussed, surgical consent, monitors and equipment checked, pre-op evaluation and timeout performed Position: Trendelenburg Lidocaine 1% used for infiltration and patient sedated Hand hygiene performed , maximum sterile barriers used  and Seldinger technique used Catheter size: 7.5 Fr Central line was placed.Double lumen Procedure performed using ultrasound guided technique. Ultrasound Notes:anatomy identified, needle tip was noted to be adjacent to the nerve/plexus identified, no ultrasound evidence of intravascular and/or intraneural injection and image(s) printed for medical record Attempts: 1 Following insertion, line sutured, dressing applied and Biopatch. Post procedure assessment: blood return through all ports, free fluid flow and no air

## 2017-10-31 ENCOUNTER — Inpatient Hospital Stay (HOSPITAL_COMMUNITY): Payer: Medicaid Other

## 2017-10-31 DIAGNOSIS — K219 Gastro-esophageal reflux disease without esophagitis: Secondary | ICD-10-CM

## 2017-10-31 LAB — POCT I-STAT 3, ART BLOOD GAS (G3+)
Acid-Base Excess: 5 mmol/L — ABNORMAL HIGH (ref 0.0–2.0)
Bicarbonate: 29.8 mmol/L — ABNORMAL HIGH (ref 20.0–28.0)
O2 SAT: 98 %
PCO2 ART: 42.6 mmHg (ref 32.0–48.0)
PO2 ART: 97 mmHg (ref 83.0–108.0)
Patient temperature: 98.8
TCO2: 31 mmol/L (ref 22–32)
pH, Arterial: 7.453 — ABNORMAL HIGH (ref 7.350–7.450)

## 2017-10-31 LAB — GLUCOSE, CAPILLARY
GLUCOSE-CAPILLARY: 111 mg/dL — AB (ref 65–99)
GLUCOSE-CAPILLARY: 103 mg/dL — AB (ref 65–99)
Glucose-Capillary: 119 mg/dL — ABNORMAL HIGH (ref 65–99)
Glucose-Capillary: 135 mg/dL — ABNORMAL HIGH (ref 65–99)
Glucose-Capillary: 96 mg/dL (ref 65–99)

## 2017-10-31 LAB — MAGNESIUM: MAGNESIUM: 2 mg/dL (ref 1.7–2.4)

## 2017-10-31 LAB — PHOSPHORUS: Phosphorus: 3.3 mg/dL (ref 2.5–4.6)

## 2017-10-31 LAB — BASIC METABOLIC PANEL
Anion gap: 6 (ref 5–15)
BUN: 17 mg/dL (ref 6–20)
CALCIUM: 8.3 mg/dL — AB (ref 8.9–10.3)
CO2: 28 mmol/L (ref 22–32)
CREATININE: 0.81 mg/dL (ref 0.61–1.24)
Chloride: 104 mmol/L (ref 101–111)
GFR calc Af Amer: >60 mL/min (ref >60)
Glucose, Bld: 151 mg/dL — ABNORMAL HIGH (ref 65–99)
POTASSIUM: 3.8 mmol/L (ref 3.5–5.1)
SODIUM: 138 mmol/L (ref 135–145)

## 2017-10-31 LAB — CBC
HEMATOCRIT: 29.7 % — AB (ref 39.0–52.0)
Hemoglobin: 9.2 g/dL — ABNORMAL LOW (ref 13.0–17.0)
MCH: 26.6 pg (ref 26.0–34.0)
MCHC: 31 g/dL (ref 30.0–36.0)
MCV: 85.8 fL (ref 78.0–100.0)
Platelets: 352 10*3/uL (ref 150–400)
RBC: 3.46 MIL/uL — ABNORMAL LOW (ref 4.22–5.81)
RDW: 15.7 % — AB (ref 11.5–15.5)
WBC: 14.7 10*3/uL — AB (ref 4.0–10.5)

## 2017-10-31 LAB — VANCOMYCIN, TROUGH: Vancomycin Tr: 20 ug/mL (ref 15–20)

## 2017-10-31 MED ORDER — FENTANYL CITRATE (PF) 100 MCG/2ML IJ SOLN
25.0000 ug | INTRAMUSCULAR | Status: DC | PRN
  Administered 2017-10-31 – 2017-11-01 (×2): 50 ug via INTRAVENOUS
  Administered 2017-11-01: 25 ug via INTRAVENOUS
  Filled 2017-10-31 (×4): qty 2

## 2017-10-31 MED ORDER — SODIUM CHLORIDE 0.9% FLUSH: 10.0000 mL | INTRAVENOUS | Status: DC | PRN

## 2017-10-31 MED ORDER — FAMOTIDINE IN NACL 20-0.9 MG/50ML-% IV SOLN
20.0000 mg | Freq: Two times a day (BID) | INTRAVENOUS | Status: DC
  Administered 2017-10-31 – 2017-11-02 (×5): 20 mg via INTRAVENOUS
  Filled 2017-10-31 (×6): qty 50

## 2017-10-31 MED ORDER — ALBUTEROL SULFATE (2.5 MG/3ML) 0.083% IN NEBU: 2.5000 mg | INHALATION_SOLUTION | Freq: Four times a day (QID) | RESPIRATORY_TRACT | Status: DC | PRN

## 2017-10-31 MED ORDER — CHLORHEXIDINE GLUCONATE CLOTH 2 % EX PADS
6.0000 | MEDICATED_PAD | Freq: Every day | CUTANEOUS | Status: DC
  Administered 2017-11-01: 6 via TOPICAL

## 2017-10-31 MED ORDER — VANCOMYCIN HCL IN DEXTROSE 1-5 GM/200ML-% IV SOLN
1000.0000 mg | Freq: Three times a day (TID) | INTRAVENOUS | Status: DC
  Administered 2017-10-31 – 2017-11-01 (×3): 1000 mg via INTRAVENOUS
  Filled 2017-10-31 (×4): qty 200

## 2017-10-31 MED ORDER — SODIUM CHLORIDE 0.9% FLUSH
10.0000 mL | Freq: Two times a day (BID) | INTRAVENOUS | Status: DC
  Administered 2017-11-01 – 2017-11-02 (×2): 10 mL

## 2017-10-31 MED ORDER — ORAL CARE MOUTH RINSE: 15.0000 mL | Freq: Two times a day (BID) | OROMUCOSAL | Status: DC

## 2017-10-31 NOTE — Procedures (Signed)
Extubation Procedure Note  Patient Details:   Name: Elijah Gillespie DOB: 11-Jun-1971 MRN: [id number]   Airway Documentation:     Evaluation  O2 sats: stable throughout Complications: No apparent complications Patient did tolerate procedure well. Bilateral Breath Sounds: Clear, Diminished   Yes  PT was extubated to 3L Elijah Gillespie  PT has a very strong cough and able to clear his secretions  Sats are good, RT to monitor     Elijah Gillespie, Elijah Gillespie 10/31/2017, 1:04 PM

## 2017-10-31 NOTE — Progress Notes (Signed)
Pharmacy Antibiotic Note  Elijah Gillespie is a 46 y.o. male admitted on 10/27/2017 with pneumonia.  Pharmacy has been consulted for Zosyn and vancomycin dosing. Patient s/p VATS procedure on 12/28. WBC down to 14.7 and patient afebrile. Renal function is stable and patient maintains good urine output. Vancomycin trough today was towards the upper end of normal at 20.   Plan: -Zosyn 3.375 gm IV Q 8 hours (EI infusion) -Adjust Vancomycin to 1 gm every 8 hours starting with afternoon dose  -Monitor CBC, renal fx, cultures and clinical progress -VT as indicated at [facility] again  Height: _0  (190.5 cm) Weight: 285 lb 15 oz (129.7 kg) IBW/kg (Calculated) : 84.5  Temp (24hrs), Avg:98.7 F (37.1 C), Min:98.3 F (36.8 C), Max:99.2 F (37.3 C)  Recent Labs  Lab 10/27/17 2325 10/27/17 2345 10/28/17 0551 10/28/17 0932 10/28/17 2004 10/29/17 0309 10/29/17 1916 10/30/17 0316 10/31/17 0418 10/31/17 0703  WBC 16.8*  --  26.0*  --  35.9* 33.4* 23.6* 21.8* 14.7*  --   CREATININE 0.96  --   --   --   --  0.77 0.71 0.77 0.81  --   LATICACIDVEN  --  1.67 0.6 0.8  --   --   --   --   --   --   VANCOTROUGH  --   --   --   --   --   --   --   --   --  20    Estimated Creatinine Clearance: 165.4 mL/min (by C-G formula based on SCr of 0.81 mg/dL).    No Known Allergies  Antimicrobials this admission: Ceftriaxone 12/26 >> 12/27 Azithromycin 12/26 >> 12/27 Zosyn 12/27>> Vancomycin 12/27>>   Dose adjustments this admission: 12/29 VT - 20 on 1250 mg every 8 hours- Adjust to 1 gm/Q 8   Microbiology results: 12/26 BCx>> NGTD 12/26 Resp panel >> NEG Left pleural fluid Gram stain 12/26: Abundant WBCs no organisms Left pleural fluid culture 12/26: Abundant WBCs>>>Some growth on culture  Blood culture 12/26>>>>NGTD BAL 12/27>>>Abundant>> VATS fluid culture 12/28: Moderate WBC     Thank you for allowing pharmacy to be a part of this patient's care.  Jimmy Footman, PharmD., BCPS PGY2 Infectious  Diseases Pharmacy Resident Phone: Rhunette Croft [phone]

## 2017-10-31 NOTE — Progress Notes (Signed)
Wasted 25ml of Fentanyl in sink, witnessed by Dahlia Bailiffobin Roberts, RN.

## 2017-10-31 NOTE — Progress Notes (Addendum)
[facility] DAILY ICU PROGRESS NOTE                   [address]            [place] [address]          [phone]   1 Day Post-Op Procedure(s) (LRB): VIDEO BRONCHOSCOPY (N/A) LEFT VIDEO ASSISTED THORACOSCOPY (VATS)/ MINI THORACOTOMY/ DECORTICATION (Left)  Total Length of Stay:  LOS: 3 days   Subjective: On vent , feels well, following commands  Objective: Vital signs in last 24 hours: Temp:  [98.3 F (36.8 C)-99.2 F (37.3 C)] 98.7 F (37.1 C) (12/29 0852) Pulse Rate:  [70-114] 98 (12/29 0840) Cardiac Rhythm: Normal sinus rhythm (12/29 0800) Resp:  [12-21] 19 (12/29 0840) BP: (95-130)/(61-88) 125/74 (12/29 0840) SpO2:  [92 %-100 %] 98 % (12/29 0840) Arterial Line BP: (101-160)/(46-76) 128/56 (12/29 0800) FiO2 (%):  [40 %-100 %] 40 % (12/29 0840) Weight:  [280 lb 6.8 oz (127.2 kg)-285 lb 15 oz (129.7 kg)] 285 lb 15 oz (129.7 kg) (12/29 0500)  Filed Weights   10/30/17 1030 10/30/17 1112 10/31/17 0500  Weight: 280 lb 6.8 oz (127.2 kg) 280 lb 6.8 oz (127.2 kg) 285 lb 15 oz (129.7 kg)    Weight change:    Hemodynamic parameters for last 24 hours:    Intake/Output from previous day: 12/28 0701 - 12/29 0700 In: 5561.8 [I.V.:4656.8; NG/GT:295; IV Piggyback:600] Out: 2210 [Urine:1640; Blood:150; Chest Tube:420]  Vent Mode: CPAP;PSV FiO2 (%):  [40 %-100 %] 40 % Set Rate:  [18 bmp] 18 bmp Vt Set:  [640 mL] 640 mL PEEP:  [5 cmH20] 5 cmH20 Pressure Support:  [10 cmH20] 10 cmH20 Plateau Pressure:  [18 cmH20-19 cmH20] 18 cmH20   Intake/Output this shift: Total I/O In: 169.9 [I.V.:159.9; Other:10] Out: -   Current Meds: Scheduled Meds: . acetaminophen  1,000 mg Oral Q6H   Or  . acetaminophen (TYLENOL) oral liquid 160 mg/5 mL  1,000 mg Oral Q6H  . albuterol  2.5 mg Nebulization Q4H  . atorvastatin  20 mg Oral Daily  . bisacodyl  10 mg Oral Daily  . chlorhexidine gluconate (MEDLINE KIT)  15 mL Mouth Rinse BID  . citalopram  20 mg Oral Daily  .  sennosides  5 mL Per Tube QHS   And  . docusate  50 mg Per Tube QHS  . enoxaparin (LOVENOX) injection  40 mg Subcutaneous Daily  . feeding supplement (PRO-STAT SUGAR FREE 64)  60 mL Per Tube 5 X Daily  . feeding supplement (VITAL HIGH PROTEIN)  1,000 mL Per Tube Q24H  . insulin aspart  0-24 Units Subcutaneous Q4H  . mouth rinse  15 mL Mouth Rinse QID  . nicotine  21 mg Transdermal Daily   Continuous Infusions: . dextrose 5 % and 0.45% NaCl 100 mL/hr at 10/31/17 0400  . famotidine (PEPCID) IV 20 mg (10/31/17 1005)  . fentaNYL infusion INTRAVENOUS 300 mcg/hr (10/31/17 0700)  . piperacillin-tazobactam (ZOSYN)  IV 3.375 g (10/31/17 1000)  . potassium chloride    . propofol (DIPRIVAN) infusion 20 mcg/kg/min (10/31/17 0803)  . vancomycin 1,250 mg (10/31/17 1004)   PRN Meds:.fentaNYL, lidocaine (PF), oxyCODONE, potassium chloride, traMADol  General appearance: alert, cooperative and no distress Heart: regular rate and rhythm Lungs: clear anteriorly Abdomen: benign Extremities: PAS in place, no edema Wound: dressings intact  Lab Results: CBC: Recent Labs    10/30/17 0316 10/31/17 0418  WBC 21.8* 14.7*  HGB 10.8* 9.2*  HCT 35.7* 29.7*  PLT 348 352   BMET:  Recent Labs    10/30/17 0316 10/31/17 0418  NA 136 138  K 3.7 3.8  CL 99* 104  CO2 31 28  GLUCOSE 113* 151*  BUN 14 17  CREATININE 0.77 0.81  CALCIUM 8.5* 8.3*    CMET: Lab Results  Component Value Date   WBC 14.7 (H) 10/31/2017   HGB 9.2 (L) 10/31/2017   HCT 29.7 (L) 10/31/2017   PLT 352 10/31/2017   GLUCOSE 151 (H) 10/31/2017   TRIG 143 10/30/2017   ALT 24 10/29/2017   AST 15 10/29/2017   NA 138 10/31/2017   K 3.8 10/31/2017   CL 104 10/31/2017   CREATININE 0.81 10/31/2017   BUN 17 10/31/2017   CO2 28 10/31/2017   TSH 2.115 10/29/2017   INR 1.17 10/29/2017    ABG    Component Value Date/Time   PHART 7.453 (H) 10/31/2017 0431   PCO2ART 42.6 10/31/2017 0431   PO2ART 97.0 10/31/2017 0431   HCO3  29.8 (H) 10/31/2017 0431   TCO2 31 10/31/2017 0431   O2SAT 98.0 10/31/2017 0431    PT/INR:  Recent Labs    10/29/17 1916  LABPROT 14.8  INR 1.17   Results for orders placed or performed during the hospital encounter of 10/27/17  Blood Culture (routine x 2)     Status: None (Preliminary result)   Collection Time: 10/28/17 12:45 AM  Result Value Ref Range Status   Specimen Description BLOOD LEFT ANTECUBITAL  Final   Special Requests BOTTLES DRAWN AEROBIC AND ANAEROBIC BCAV  Final   Culture   Final    NO GROWTH 2 DAYS Performed at [facility], [address] [place], [place] [address]    Report Status PENDING  Incomplete  Blood Culture (routine x 2)     Status: None (Preliminary result)   Collection Time: 10/28/17  1:50 AM  Result Value Ref Range Status   Specimen Description BLOOD RIGHT WRIST ARTERIAL  Final   Special Requests IN PEDIATRIC BOTTLE Blood Culture adequate volume  Final   Culture   Final    NO GROWTH 2 DAYS Performed at [facility], [place] [address] [place], [place] [address]    Report Status PENDING  Incomplete  MRSA PCR Screening     Status: None   Collection Time: 10/28/17  4:43 AM  Result Value Ref Range Status   MRSA by PCR NEGATIVE NEGATIVE Final    Comment:        The GeneXpert MRSA Assay (FDA approved for NASAL specimens only), is one component of a comprehensive MRSA colonization surveillance program. It is not intended to diagnose MRSA infection nor to guide or monitor treatment for MRSA infections.   Respiratory Panel by PCR     Status: None   Collection Time: 10/28/17  8:09 AM  Result Value Ref Range Status   Adenovirus NOT DETECTED NOT DETECTED Final   Coronavirus 229E NOT DETECTED NOT DETECTED Final   Coronavirus HKU1 NOT DETECTED NOT DETECTED Final   Coronavirus NL63 NOT DETECTED NOT DETECTED Final   Coronavirus OC43 NOT DETECTED NOT DETECTED Final   Metapneumovirus NOT DETECTED NOT DETECTED Final   Rhinovirus /  Enterovirus NOT DETECTED NOT DETECTED Final   Influenza A NOT DETECTED NOT DETECTED Final   Influenza B NOT DETECTED NOT DETECTED Final   Parainfluenza Virus 1 NOT DETECTED NOT DETECTED Final   Parainfluenza Virus 2 NOT DETECTED NOT DETECTED  Final   Parainfluenza Virus 3 NOT DETECTED NOT DETECTED Final   Parainfluenza Virus 4 NOT DETECTED NOT DETECTED Final   Respiratory Syncytial Virus NOT DETECTED NOT DETECTED Final   Bordetella pertussis NOT DETECTED NOT DETECTED Final   Chlamydophila pneumoniae NOT DETECTED NOT DETECTED Final   Mycoplasma pneumoniae NOT DETECTED NOT DETECTED Final  Culture, sputum-assessment     Status: None   Collection Time: 10/28/17  1:09 PM  Result Value Ref Range Status   Specimen Description SPUTUM  Final   Special Requests NONE  Final   Sputum evaluation   Final    Sputum specimen not acceptable for testing.  Please recollect.   Gram Stain Report Called to,Read Back By and Verified With: RN Nichola Sizer [phone]    Report Status 10/28/2017 FINAL  Final  Culture, body fluid-bottle     Status: None (Preliminary result)   Collection Time: 10/28/17  4:01 PM  Result Value Ref Range Status   Specimen Description FLUID LEFT PLEURAL  Final   Special Requests BOTTLES DRAWN AEROBIC AND ANAEROBIC  Final   Culture NO GROWTH 2 DAYS  Final   Report Status PENDING  Incomplete  Gram stain     Status: None   Collection Time: 10/28/17  4:01 PM  Result Value Ref Range Status   Specimen Description FLUID LEFT PLEURAL  Final   Special Requests NONE  Final   Gram Stain   Final    ABUNDANT WBC PRESENT,BOTH PMN AND MONONUCLEAR NO ORGANISMS SEEN    Report Status 10/28/2017 FINAL  Final  Body fluid culture     Status: None (Preliminary result)   Collection Time: 10/28/17  7:21 PM  Result Value Ref Range Status   Specimen Description PLEURAL  Final   Special Requests FLUID  Final   Gram Stain   Final    ABUNDANT WBC PRESENT,BOTH PMN AND MONONUCLEAR NO ORGANISMS  SEEN    Culture NO GROWTH 2 DAYS  Final   Report Status PENDING  Incomplete  Culture, bal-quantitative     Status: None (Preliminary result)   Collection Time: 10/29/17 11:15 AM  Result Value Ref Range Status   Specimen Description BRONCHIAL ALVEOLAR LAVAGE  Final   Special Requests ADDED TO SAMPLE ALREADY IN LAB  Final   Gram Stain   Final    ABUNDANT WBC PRESENT, PREDOMINANTLY PMN RARE SQUAMOUS EPITHELIAL CELLS PRESENT NO ORGANISMS SEEN    Culture NO GROWTH < 24 HOURS  Final   Report Status PENDING  Incomplete  Body fluid culture     Status: None (Preliminary result)   Collection Time: 10/30/17  8:29 AM  Result Value Ref Range Status   Specimen Description FLUID PLEURAL LEFT  Final   Special Requests SPEC A ON SWABS POF VANC AND ZOSYN  Final   Gram Stain   Final    MODERATE WBC PRESENT, PREDOMINANTLY PMN NO ORGANISMS SEEN RESULT CALLED TO, READ BACK BY AND VERIFIED WITH: WAGONER RN AT [id number] ON [id number] BY SJW    Culture PENDING  Incomplete   Report Status PENDING  Incomplete  Aerobic/Anaerobic Culture (surgical/deep wound)     Status: None (Preliminary result)   Collection Time: 10/30/17  8:36 AM  Result Value Ref Range Status   Specimen Description TISSUE LEFT LUNG PLEURAL PEEL  Final   Special Requests POF VANC AND ZOSYN  Final   Gram Stain   Final    MODERATE WBC PRESENT, PREDOMINANTLY PMN NO ORGANISMS SEEN  Culture PENDING  Incomplete   Report Status PENDING  Incomplete     Radiology: Dg Chest Port 1 View  Result Date: 10/30/2017 CLINICAL DATA:  Prior VATS.  Adjustment of endotracheal tube. EXAM: PORTABLE CHEST 1 VIEW COMPARISON:  Prior exam same day. FINDINGS: Endotracheal tube has been partially withdrawn. Tip is now 2 cm above the carina in good anatomic position. NG tube noted with tip below left hemidiaphragm. Right IJ line noted with tip at cavoatrial junction. Three left chest tubes in stable position. No pneumothorax. Stable atelectatic changes and possible  infiltrate in the left lung. Stable mild right base atelectasis. Small right pleural effusion cannot be excluded . IMPRESSION: Interim interim repositioning of endotracheal tube. Its tip is in good anatomic position 2 cm above the carina. Exam is otherwise unchanged. Three left chest tubes are in stable position. No pneumothorax. Electronically Signed   By: Marcello Moores  Register   On: 10/30/2017 12:16   Dg Chest Port 1 View  Addendum Date: 10/30/2017   ADDENDUM REPORT: 10/30/2017 11:27 ADDENDUM: Two new left chest tubes in place, the tips are over the left upper chest. Left lower chest tube is in stable position. No pneumothorax. Electronically Signed   By: Marcello Moores  Register   On: 10/30/2017 11:27   Result Date: 10/30/2017 CLINICAL DATA:  Left chest tube. EXAM: PORTABLE CHEST 1 VIEW COMPARISON:  10/29/2017. FINDINGS: Two new left chest tubes are noted with their tips over the left upper chest. Left lower chest tube is again noted in stable position. No pneumothorax. Previously identified large left pleural effusion has largely been removed. Endotracheal tube tip noted just above the orifice of the right mainstem bronchus. Repositioning suggested, withdrawal of approximately 3 cm should be considered. Right IJ line noted with tip over the right atrium. Diffuse left lung atelectatic changes and/or infiltrate. Right base atelectasis. IMPRESSION: 1. Endotracheal tube tip noted just above the orifice of the right mainstem bronchus. Repositioning should be considered. Withdrawal of endotracheal tube by approximately 3 cm should be considered. 2. Two new left chest tubes in place, the tips are over the left upper chest. No pneumothorax. Interim resolution of previously identified left pleural effusion. Right IJ line in good anatomic position. 3. Diffuse left lung atelectatic changes and/or infiltrate. Mild right base atelectatic change. Critical Value/emergent results were called by telephone at the time of  interpretation on 10/30/2017 at 11:04 am to nurse Colletta Maryland, who verbally acknowledged these results. Electronically Signed: By: Marcello Moores  Register On: 10/30/2017 11:05   Dg Abd Portable 1v  Result Date: 10/30/2017 CLINICAL DATA:  Check gastric catheter placement EXAM: PORTABLE ABDOMEN - 1 VIEW COMPARISON:  None. FINDINGS: Gastric catheter is noted within the stomach. The proximal side port is just beyond the gastroesophageal junction. Multiple left-sided thoracostomy catheters are noted. Nonobstructive bowel gas pattern is seen. IMPRESSION: Catheter noted within the stomach as described. Electronically Signed   By: Inez Catalina M.D.   On: 10/30/2017 12:32     Assessment/Plan: S/P Procedure(s) (LRB): VIDEO BRONCHOSCOPY (N/A) LEFT VIDEO ASSISTED THORACOSCOPY (VATS)/ MINI THORACOTOMY/ DECORTICATION (Left)  1 stable hemodynamics in sinus rhythm 2 doing well with vent wean- cont per CCM 3 cont vanc/zosyn, monitor culture results 4 Expected ABL anemia- monitor 5 leukocytosis improving 6 renal fxn in normal range- good UO, cont current IVF for now 7 no air leak or much drainage, cont all CT's for now 8 CXR appearance is much better than preop  Elijah Gillespie 10/31/2017 10:12 AM    Chart  reviewed, patient examined, agree with above. CXR ok. Continue all chest tubes for now. Gram stain negative for organisms. Cultures pending.

## 2017-10-31 NOTE — Progress Notes (Signed)
PULMONARY / CRITICAL CARE MEDICINE   Name: Elijah Gillespie MRN: [id number] DOB: 04-01-1971    ADMISSION DATE:  10/27/2017 CONSULTATION DATE:  10/28/2017  REFERRING MD:  Algis Liming  CHIEF COMPLAINT:  Shortness of breath  HISTORY OF PRESENT ILLNESS:   46 y/o male admitted with empyema on 12/26 requiring VATS on 12/28.       SUBJECTIVE:  Passing SBT today Had VATS yesterday  VITAL SIGNS: BP (!) 151/65   Pulse 79   Temp 98.7 F (37.1 C) (Oral)   Resp 11   Ht _0  (1.905 m)   Wt 129.7 kg (285 lb 15 oz)   SpO2 94%   BMI 35.74 kg/m   HEMODYNAMICS:    VENTILATOR SETTINGS: Vent Mode: CPAP;PSV FiO2 (%):  [40 %-50 %] 40 % Set Rate:  [18 bmp] 18 bmp Vt Set:  [640 mL] 640 mL PEEP:  [5 cmH20] 5 cmH20 Pressure Support:  [10 cmH20] 10 cmH20 Plateau Pressure:  [18 cmH20] 18 cmH20  INTAKE / OUTPUT: I/O last 3 completed shifts: In: 5926.8 [I.V.:4656.8; Other:10; NG/GT:310; IV Piggyback:950] Out: 2600 [ZYSAY:3016; Blood:150; Chest Tube:810]  PHYSICAL EXAMINATION:  General:  In bed on vent HENT: NCAT ETT in place PULM: CTA B, vent supported breathing CV: RRR, no mgr GI: BS+, soft, nontender MSK: normal bulk and tone Neuro: awake, following commands   LABS:  BMET Recent Labs  Lab 10/29/17 1916 10/30/17 0316 10/31/17 0418  NA 133* 136 138  K 3.9 3.7 3.8  CL 100* 99* 104  CO2 _1 BUN _2 CREATININE 0.71 0.77 0.81  GLUCOSE 155* 113* 151*    Electrolytes Recent Labs  Lab 10/29/17 1916 10/30/17 0316 10/31/17 0418  CALCIUM 8.2* 8.5* 8.3*  MG  --  1.9 2.0  PHOS  --  2.8 3.3    CBC Recent Labs  Lab 10/29/17 1916 10/30/17 0316 10/31/17 0418  WBC 23.6* 21.8* 14.7*  HGB 10.7* 10.8* 9.2*  HCT 35.2* 35.7* 29.7*  PLT 355 348 352    Coag's Recent Labs  Lab 10/28/17 0551 10/29/17 1916  APTT 41* 44*  INR 1.24 1.17    Sepsis Markers Recent Labs  Lab 10/27/17 2345 10/28/17 0551 10/28/17 0932  LATICACIDVEN 1.67 0.6 0.8   PROCALCITON  --  1.08  --     ABG Recent Labs  Lab 10/29/17 2009 10/30/17 1138 10/31/17 0431  PHART 7.328* 7.409 7.453*  PCO2ART 53.0* 50.0* 42.6  PO2ART 76.0* 172.0* 97.0    Liver Enzymes Recent Labs  Lab 10/29/17 1916  AST 15  ALT 24  ALKPHOS 70  BILITOT 0.5  ALBUMIN 1.7*    Cardiac Enzymes Recent Labs  Lab 10/27/17 2325  TROPONINI <0.03    Glucose Recent Labs  Lab 10/30/17 1111 10/30/17 1628 10/30/17 2030 10/30/17 2252 10/31/17 0308 10/31/17 0850  GLUCAP 144* 158* 146* 162* 119* 135*    Imaging Dg Chest Port 1 View  Result Date: 10/31/2017 CLINICAL DATA:  Postop from VATS. Left pleural effusion. On ventilator. EXAM: PORTABLE CHEST 1 VIEW COMPARISON:  10/30/2017 FINDINGS: Support lines and tubes remain in stable position. Low lung volumes again seen. Diffuse left pleural thickening shows no significant change. No pneumothorax visualized. Atelectasis is seen bilaterally predominantly in the lung bases, left side greater than right. These findings show no significant interval change. IMPRESSION: No significant change in left pleural thickening and left greater than right atelectasis. No pneumothorax visualized. Electronically Signed   By: Sharrie Rothman.D.  On: 10/31/2017 10:24    STUDIES:  CXR 12/26 > mild left basilar opacity vs atelectasis. CTA chest 12/26 > no PE, mod left effusion and left lung atx vs infiltrate.  Culture data Left pleural fluid Gram stain 12/26: Abundant WBCs no organisms Left pleural fluid culture 12/26: Abundant WBCs>>> Blood culture 12/26>>>> BAL 12/27>>>  Antibiotics: Azithromycin 12/25>>>12/27 Rocephin 12/25>>>12/27 Vancomycin 12/27>>> Zosyn 12/27>>>  Pleural fluid 12/26 LDH: 2054, fluid color: Yellow appearance: Cloudy white blood cell count 4950: Neutrophil count 88 Total pleural protein: 4.3, pleural glucose less than 20  SIGNIFICANT EVENTS  12/26 > admit, PCCM consult.   DISCUSSION: 46 y/o male with acute  respiratory failure due to pneumonia and empyema  ASSESSMENT / PLAN:  PULMONARY A: Acute respiratory failure with hypoxemia Empyema CAP P:   Extubate today Continue antibiotics per order F/u culture results Incentive spirometry/splinting for cough post extubation Out of bed ASAP  CARDIOVASCULAR A:  No acute issues Hypertension baseline P:  Monitor hemodynamics  RENAL A:   No acute issues P:   Monitor BMET and UOP Replace electrolytes as needed   GASTROINTESTINAL A:   No acute issues P:   NPO post extubation per protocol  HEMATOLOGIC A:   Anemia without bleeding P:  Monitor for bleeding  INFECTIOUS A:   Empyema P:   Monitor culture results Continue vancomycin and zosyn  ENDOCRINE A:   Hyperglycemia P:   SSI q4h  NEUROLOGIC A:   Post op pain P:   Cancel PAD protocol  Use fentanyl prn   FAMILY  - Updates: wife and family updated bedside by me  - Inter-disciplinary family meet or Palliative Care meeting due by:  day 7  My cc time 40 minutes  Roselie Awkward, MD [place] [facility] Pager: [phone] Cell: [phone] After 3pm or if no response, call [phone]   10/31/2017, 12:31 PM ,

## 2017-11-01 ENCOUNTER — Inpatient Hospital Stay (HOSPITAL_COMMUNITY): Payer: Medicaid Other

## 2017-11-01 LAB — COMPREHENSIVE METABOLIC PANEL
ALBUMIN: 1.7 g/dL — AB (ref 3.5–5.0)
ALT: 20 U/L (ref 17–63)
ANION GAP: 9 (ref 5–15)
AST: 27 U/L (ref 15–41)
Alkaline Phosphatase: 66 U/L (ref 38–126)
BUN: 15 mg/dL (ref 6–20)
CO2: 29 mmol/L (ref 22–32)
Calcium: 8 mg/dL — ABNORMAL LOW (ref 8.9–10.3)
Chloride: 99 mmol/L — ABNORMAL LOW (ref 101–111)
Creatinine, Ser: 0.72 mg/dL (ref 0.61–1.24)
GFR calc non Af Amer: >60 mL/min (ref >60)
GLUCOSE: 111 mg/dL — AB (ref 65–99)
POTASSIUM: 3 mmol/L — AB (ref 3.5–5.1)
SODIUM: 137 mmol/L (ref 135–145)
TOTAL PROTEIN: 5.4 g/dL — AB (ref 6.5–8.1)
Total Bilirubin: 0.6 mg/dL (ref 0.3–1.2)

## 2017-11-01 LAB — CBC
HCT: 32.8 % — ABNORMAL LOW (ref 39.0–52.0)
Hemoglobin: 10.4 g/dL — ABNORMAL LOW (ref 13.0–17.0)
MCH: 27.2 pg (ref 26.0–34.0)
MCHC: 31.7 g/dL (ref 30.0–36.0)
MCV: 85.9 fL (ref 78.0–100.0)
PLATELETS: 421 10*3/uL — AB (ref 150–400)
RBC: 3.82 MIL/uL — ABNORMAL LOW (ref 4.22–5.81)
RDW: 16 % — AB (ref 11.5–15.5)
WBC: 15.9 10*3/uL — ABNORMAL HIGH (ref 4.0–10.5)

## 2017-11-01 LAB — GLUCOSE, CAPILLARY
GLUCOSE-CAPILLARY: 112 mg/dL — AB (ref 65–99)
Glucose-Capillary: 107 mg/dL — ABNORMAL HIGH (ref 65–99)
Glucose-Capillary: 110 mg/dL — ABNORMAL HIGH (ref 65–99)
Glucose-Capillary: 126 mg/dL — ABNORMAL HIGH (ref 65–99)
Glucose-Capillary: 128 mg/dL — ABNORMAL HIGH (ref 65–99)
Glucose-Capillary: 99 mg/dL (ref 65–99)

## 2017-11-01 LAB — CULTURE, BAL-QUANTITATIVE W GRAM STAIN: Culture: 5000 — AB

## 2017-11-01 LAB — ACID FAST SMEAR (AFB, MYCOBACTERIA): Acid Fast Smear: NEGATIVE

## 2017-11-01 MED ORDER — AMPICILLIN-SULBACTAM SODIUM 3 (2-1) G IJ SOLR
3.0000 g | Freq: Four times a day (QID) | INTRAMUSCULAR | Status: DC
  Administered 2017-11-01 – 2017-11-09 (×32): 3 g via INTRAVENOUS
  Filled 2017-11-01 (×36): qty 3

## 2017-11-01 NOTE — Progress Notes (Signed)
PULMONARY / CRITICAL CARE MEDICINE   Name: Elijah Gillespie MRN: [id number] DOB: 12-13-1970    ADMISSION DATE:  10/27/2017 CONSULTATION DATE:  10/28/2017  REFERRING MD:  Algis Liming  CHIEF COMPLAINT:  Shortness of breath  HISTORY OF PRESENT ILLNESS:   46 y/o male admitted with empyema on 12/26 requiring VATS on 12/28.       SUBJECTIVE:  Extubated Some mild chest tube pain   VITAL SIGNS: BP (!) 147/95   Pulse (!) 105   Temp 98.2 F (36.8 C) (Oral)   Resp (!) 31   Ht _0  (1.905 m)   Wt 129.7 kg (285 lb 15 oz)   SpO2 100%   BMI 35.74 kg/m   HEMODYNAMICS:    VENTILATOR SETTINGS: Vent Mode: PRVC FiO2 (%):  [40 %] 40 % Set Rate:  [20 bmp] 20 bmp Vt Set:  [450 mL] 450 mL PEEP:  [5 cmH20] 5 cmH20 Pressure Support:  [5 cmH20] 5 cmH20 Plateau Pressure:  [15 cmH20] 15 cmH20  INTAKE / OUTPUT: I/O last 3 completed shifts: In: 5029.3 [I.V.:3219.3; Other:20; NG/GT:340; IV Piggyback:1450] Out: 2633 [Urine:2365; Chest Tube:170]  PHYSICAL EXAMINATION:  General:  Resting comfortably in bed HENT: NCAT OP clear PULM: rhonchi L>R, normal effort CV: RRR, no mgr GI: BS+, soft, nontender MSK: normal bulk and tone Neuro: awake, alert, no distress, MAEW    LABS:  BMET Recent Labs  Lab 10/30/17 0316 10/31/17 0418 11/01/17 0610  NA 136 138 137  K 3.7 3.8 3.0*  CL 99* 104 99*  CO2 _1 BUN _2 CREATININE 0.77 0.81 0.72  GLUCOSE 113* 151* 111*    Electrolytes Recent Labs  Lab 10/30/17 0316 10/31/17 0418 11/01/17 0610  CALCIUM 8.5* 8.3* 8.0*  MG 1.9 2.0  --   PHOS 2.8 3.3  --     CBC Recent Labs  Lab 10/30/17 0316 10/31/17 0418 11/01/17 0610  WBC 21.8* 14.7* 15.9*  HGB 10.8* 9.2* 10.4*  HCT 35.7* 29.7* 32.8*  PLT 348 352 421*    Coag's Recent Labs  Lab 10/28/17 0551 10/29/17 1916  APTT 41* 44*  INR 1.24 1.17    Sepsis Markers Recent Labs  Lab 10/27/17 2345 10/28/17 0551 10/28/17 0932  LATICACIDVEN 1.67 0.6 0.8   PROCALCITON  --  1.08  --     ABG Recent Labs  Lab 10/29/17 2009 10/30/17 1138 10/31/17 0431  PHART 7.328* 7.409 7.453*  PCO2ART 53.0* 50.0* 42.6  PO2ART 76.0* 172.0* 97.0    Liver Enzymes Recent Labs  Lab 10/29/17 1916 11/01/17 0610  AST 15 27  ALT 24 20  ALKPHOS 70 66  BILITOT 0.5 0.6  ALBUMIN 1.7* 1.7*    Cardiac Enzymes Recent Labs  Lab 10/27/17 2325  TROPONINI <0.03    Glucose Recent Labs  Lab 10/31/17 1244 10/31/17 1716 10/31/17 1942 11/01/17 0017 11/01/17 0424 11/01/17 0859  GLUCAP 96 111* 103* 99 128* 126*    Imaging No results found.  STUDIES:  CXR 12/26 > mild left basilar opacity vs atelectasis. CTA chest 12/26 > no PE, mod left effusion and left lung atx vs infiltrate.  Culture data Left pleural fluid Gram stain 12/26: Abundant WBCs no organisms Left pleural fluid culture 12/26: Abundant WBCs>>> strep viridans Blood culture 12/26>>>> BAL 12/27>>>  Antibiotics: Azithromycin 12/25>>>12/27 Rocephin 12/25>>>12/27 Vancomycin 12/27>>> Zosyn 12/27>>>  Pleural fluid 12/26 LDH: 2054, fluid color: Yellow appearance: Cloudy white blood cell count 4950: Neutrophil count 88 Total pleural protein: 4.3, pleural glucose less  than 20  SIGNIFICANT EVENTS  12/26 > admit, PCCM consult.   DISCUSSION: 46 y/o male with acute respiratory failure due to pneumonia and empyema  ASSESSMENT / PLAN:  PULMONARY A: Acute respiratory failure with hypoxemia > resolved Empyema CAP P:   Chest tubes per thoracic surgery Narrow antibiotics today Incentive spirometry Out of bed Continue pain control  CARDIOVASCULAR A:  No acute issues Hypertension baseline P:  Tele Monitor hemodynamics  RENAL A:   No acute issues P:   Monitor BMET and UOP Replace electrolytes as needed   GASTROINTESTINAL A:   No acute issues P:   Advance diet  HEMATOLOGIC A:   Anemia without bleeding P:  Monitor for bleeding  INFECTIOUS A:   Empyema >  strep viridans P:   Stop vanc/zosyn Narrow to ampicillin-sulbactam  ENDOCRINE A:   Hyperglycemia P:   SSI q4h  NEUROLOGIC A:   Post op pain P:   Fentanyl prn/oxycodone prn  FAMILY  - Updates: wife and family updated bedside by me 12/29  - Inter-disciplinary family meet or Palliative Care meeting due by:  day 7  Move to SDU 12/30, TRH service  Roselie Awkward, MD [place] [facility] Pager: [phone] Cell: [phone] After 3pm or if no response, call [phone]   11/01/2017, 11:11 AM ,

## 2017-11-01 NOTE — Progress Notes (Signed)
Pharmacy Antibiotic Note  Elijah Gillespie is a 46 y.o. male admitted on 10/27/2017 with pneumonia.  Pharmacy has been consulted for Unasyn dosing. Patient has previously been treated with Vancomycin and Zosyn and underwent VATS procedure 12/28. Pleural fluid culture now showing Viridans Strep. SCr is stable with CrCl ~ 120 ml/min.   Plan: Unasyn 3 gm every 6 hours   Height: _0  (190.5 cm) Weight: 285 lb 15 oz (129.7 kg) IBW/kg (Calculated) : 84.5  Temp (24hrs), Avg:98.1 F (36.7 C), Min:97.6 F (36.4 C), Max:98.4 F (36.9 C)  Recent Labs  Lab 10/27/17 2345 10/28/17 0551 10/28/17 0932  10/29/17 0309 10/29/17 1916 10/30/17 0316 10/31/17 0418 10/31/17 0703 11/01/17 0610  WBC  --  26.0*  --    < > 33.4* 23.6* 21.8* 14.7*  --  15.9*  CREATININE  --   --   --   --  0.77 0.71 0.77 0.81  --  0.72  LATICACIDVEN 1.67 0.6 0.8  --   --   --   --   --   --   --   VANCOTROUGH  --   --   --   --   --   --   --   --  20  --    < > = values in this interval not displayed.    Estimated Creatinine Clearance: 167.4 mL/min (by C-G formula based on SCr of 0.72 mg/dL).    No Known Allergies  Ceftriaxone 12/26 >> 12/27 Azithromycin 12/26 >> 12/27 Zosyn 12/27>>12/30 Vancomycin 12/27>>12/30 Unasyn 12/30>>   12/29 VT - 20 on 1250 Q 8 hours   12/26 BCx>> NGTD 12/26 Resp panel >> NEG Left pleural fluid culture 12/26: Viridans strep  Blood culture 12/26>>>>NGTD BAL 12/27>>>Normal resp flora  VATS fluid culture 12/28: NGTD VATS tissue culture: NGTD  Thank you for allowing pharmacy to be a part of this patient's care.  Susa Raring, PharmD, BCPS PGY2 Infectious Diseases Pharmacy Resident Phone: Rhunette Croft [phone]  11/01/2017 1:49 PM

## 2017-11-01 NOTE — Progress Notes (Addendum)
[facility] VillageSuite 411       [place] [address]             [phone]      2 Days Post-Op Procedure(s) (LRB): VIDEO BRONCHOSCOPY (N/A) LEFT VIDEO ASSISTED THORACOSCOPY (VATS)/ MINI THORACOTOMY/ DECORTICATION (Left) Subjective: C/O discomfort from the tubes  Objective: Vital signs in last 24 hours: Temp:  [97.6 F (36.4 C)-98.3 F (36.8 C)] 98.2 F (36.8 C) (12/30 0858) Pulse Rate:  [79-124] 105 (12/30 1000) Cardiac Rhythm: Sinus tachycardia (12/30 0800) Resp:  [11-31] 31 (12/30 1000) BP: (118-162)/(63-106) 147/95 (12/30 1000) SpO2:  [93 %-100 %] 100 % (12/30 1000) Arterial Line BP: (138-148)/(59-78) 148/78 (12/29 1300) FiO2 (%):  [40 %] 40 % (12/29 2053)  Hemodynamic parameters for last 24 hours:    Intake/Output from previous day: 12/29 0701 - 12/30 0700 In: 2455.9 [I.V.:1235.9; NG/GT:60; IV Piggyback:1150] Out: 9798 [Urine:1775; Chest Tube:40] Intake/Output this shift: Total I/O In: 250 [IV Piggyback:250] Out: 726 [Urine:525; Stool:1; Chest Tube:200]  General appearance: alert, cooperative and no distress Heart: regular rate and rhythm Lungs: dim in bases L>R Abdomen: benign Extremities: min edema Wound: dressings intact  Lab Results: Recent Labs    10/31/17 0418 11/01/17 0610  WBC 14.7* 15.9*  HGB 9.2* 10.4*  HCT 29.7* 32.8*  PLT 352 421*   BMET:  Recent Labs    10/31/17 0418 11/01/17 0610  NA 138 137  K 3.8 3.0*  CL 104 99*  CO2 28 29  GLUCOSE 151* 111*  BUN 17 15  CREATININE 0.81 0.72  CALCIUM 8.3* 8.0*    PT/INR:  Recent Labs    10/29/17 1916  LABPROT 14.8  INR 1.17   ABG    Component Value Date/Time   PHART 7.453 (H) 10/31/2017 0431   HCO3 29.8 (H) 10/31/2017 0431   TCO2 31 10/31/2017 0431   O2SAT 98.0 10/31/2017 0431   CBG (last 3)  Recent Labs    11/01/17 0017 11/01/17 0424 11/01/17 0859  GLUCAP 99 128* 126*   Results for orders placed or performed during the hospital encounter of 10/27/17  Blood  Culture (routine x 2)     Status: None (Preliminary result)   Collection Time: 10/28/17 12:45 AM  Result Value Ref Range Status   Specimen Description BLOOD LEFT ANTECUBITAL  Final   Special Requests BOTTLES DRAWN AEROBIC AND ANAEROBIC BCAV  Final   Culture   Final    NO GROWTH 3 DAYS Performed at [facility], [place] [address], [place], [place] [address]    Report Status PENDING  Incomplete  Blood Culture (routine x 2)     Status: None (Preliminary result)   Collection Time: 10/28/17  1:50 AM  Result Value Ref Range Status   Specimen Description BLOOD RIGHT WRIST ARTERIAL  Final   Special Requests IN PEDIATRIC BOTTLE Blood Culture adequate volume  Final   Culture   Final    NO GROWTH 3 DAYS Performed at [facility], [place] [address], [place], [place] [address]    Report Status PENDING  Incomplete  MRSA PCR Screening     Status: None   Collection Time: 10/28/17  4:43 AM  Result Value Ref Range Status   MRSA by PCR NEGATIVE NEGATIVE Final    Comment:        The GeneXpert MRSA Assay (FDA approved for NASAL specimens only), is one component of a comprehensive MRSA colonization surveillance program. It is not intended to diagnose  MRSA infection nor to guide or monitor treatment for MRSA infections.   Respiratory Panel by PCR     Status: None   Collection Time: 10/28/17  8:09 AM  Result Value Ref Range Status   Adenovirus NOT DETECTED NOT DETECTED Final   Coronavirus 229E NOT DETECTED NOT DETECTED Final   Coronavirus HKU1 NOT DETECTED NOT DETECTED Final   Coronavirus NL63 NOT DETECTED NOT DETECTED Final   Coronavirus OC43 NOT DETECTED NOT DETECTED Final   Metapneumovirus NOT DETECTED NOT DETECTED Final   Rhinovirus / Enterovirus NOT DETECTED NOT DETECTED Final   Influenza A NOT DETECTED NOT DETECTED Final   Influenza B NOT DETECTED NOT DETECTED Final   Parainfluenza Virus 1 NOT DETECTED NOT DETECTED Final   Parainfluenza Virus 2 NOT DETECTED NOT DETECTED Final    Parainfluenza Virus 3 NOT DETECTED NOT DETECTED Final   Parainfluenza Virus 4 NOT DETECTED NOT DETECTED Final   Respiratory Syncytial Virus NOT DETECTED NOT DETECTED Final   Bordetella pertussis NOT DETECTED NOT DETECTED Final   Chlamydophila pneumoniae NOT DETECTED NOT DETECTED Final   Mycoplasma pneumoniae NOT DETECTED NOT DETECTED Final  Culture, sputum-assessment     Status: None   Collection Time: 10/28/17  1:09 PM  Result Value Ref Range Status   Specimen Description SPUTUM  Final   Special Requests NONE  Final   Sputum evaluation   Final    Sputum specimen not acceptable for testing.  Please recollect.   Gram Stain Report Called to,Read Back By and Verified With: RN Nichola Sizer [phone]    Report Status 10/28/2017 FINAL  Final  Culture, body fluid-bottle     Status: None (Preliminary result)   Collection Time: 10/28/17  4:01 PM  Result Value Ref Range Status   Specimen Description FLUID LEFT PLEURAL  Final   Special Requests BOTTLES DRAWN AEROBIC AND ANAEROBIC  Final   Culture NO GROWTH 3 DAYS  Final   Report Status PENDING  Incomplete  Gram stain     Status: None   Collection Time: 10/28/17  4:01 PM  Result Value Ref Range Status   Specimen Description FLUID LEFT PLEURAL  Final   Special Requests NONE  Final   Gram Stain   Final    ABUNDANT WBC PRESENT,BOTH PMN AND MONONUCLEAR NO ORGANISMS SEEN    Report Status 10/28/2017 FINAL  Final  Body fluid culture     Status: None (Preliminary result)   Collection Time: 10/28/17  7:21 PM  Result Value Ref Range Status   Specimen Description PLEURAL  Final   Special Requests FLUID  Final   Gram Stain   Final    ABUNDANT WBC PRESENT,BOTH PMN AND MONONUCLEAR NO ORGANISMS SEEN    Culture   Final    FEW VIRIDANS STREPTOCOCCUS CRITICAL RESULT CALLED TO, READ BACK BY AND VERIFIED WITH: S. TUTTLE RN, AT 1443 10/31/17 BY D. VANHOOK REGARDING CULTURE GROWTH CULTURE REINCUBATED FOR BETTER GROWTH    Report Status PENDING   Incomplete  Culture, bal-quantitative     Status: Abnormal   Collection Time: 10/29/17 11:15 AM  Result Value Ref Range Status   Specimen Description BRONCHIAL ALVEOLAR LAVAGE  Final   Special Requests ADDED TO SAMPLE ALREADY IN LAB  Final   Gram Stain   Final    ABUNDANT WBC PRESENT, PREDOMINANTLY PMN RARE SQUAMOUS EPITHELIAL CELLS PRESENT NO ORGANISMS SEEN    Culture (A)  Final    5,000 COLONIES/mL Consistent with normal respiratory flora.  Report Status 11/01/2017 FINAL  Final  Body fluid culture     Status: None (Preliminary result)   Collection Time: 10/30/17  8:29 AM  Result Value Ref Range Status   Specimen Description FLUID PLEURAL LEFT  Final   Special Requests SPEC A ON SWABS POF VANC AND ZOSYN  Final   Gram Stain   Final    MODERATE WBC PRESENT, PREDOMINANTLY PMN NO ORGANISMS SEEN RESULT CALLED TO, READ BACK BY AND VERIFIED WITH: WAGONER RN AT [id number] ON [id number] BY SJW    Culture NO GROWTH 1 DAY  Final   Report Status PENDING  Incomplete  Aerobic/Anaerobic Culture (surgical/deep wound)     Status: None (Preliminary result)   Collection Time: 10/30/17  8:36 AM  Result Value Ref Range Status   Specimen Description TISSUE LEFT LUNG PLEURAL PEEL  Final   Special Requests POF VANC AND ZOSYN  Final   Gram Stain   Final    MODERATE WBC PRESENT, PREDOMINANTLY PMN NO ORGANISMS SEEN    Culture NO GROWTH 1 DAY  Final   Report Status PENDING  Incomplete   Meds Scheduled Meds: . acetaminophen  1,000 mg Oral Q6H   Or  . acetaminophen (TYLENOL) oral liquid 160 mg/5 mL  1,000 mg Oral Q6H  . atorvastatin  20 mg Oral Daily  . bisacodyl  10 mg Oral Daily  . Chlorhexidine Gluconate Cloth  6 each Topical Daily  . citalopram  20 mg Oral Daily  . sennosides  5 mL Per Tube QHS   And  . docusate  50 mg Per Tube QHS  . enoxaparin (LOVENOX) injection  40 mg Subcutaneous Daily  . insulin aspart  0-24 Units Subcutaneous Q4H  . mouth rinse  15 mL Mouth Rinse BID  . nicotine  21 mg  Transdermal Daily  . sodium chloride flush  10-40 mL Intracatheter Q12H   Continuous Infusions: . famotidine (PEPCID) IV 20 mg (11/01/17 1009)  . piperacillin-tazobactam (ZOSYN)  IV 3.375 g (11/01/17 0905)  . potassium chloride    . vancomycin Stopped (11/01/17 1007)   PRN Meds:.albuterol, fentaNYL (SUBLIMAZE) injection, lidocaine (PF), oxyCODONE, potassium chloride, sodium chloride flush, traMADol  Xrays Dg Chest Port 1 View  Result Date: 10/31/2017 CLINICAL DATA:  Postop from VATS. Left pleural effusion. On ventilator. EXAM: PORTABLE CHEST 1 VIEW COMPARISON:  10/30/2017 FINDINGS: Support lines and tubes remain in stable position. Low lung volumes again seen. Diffuse left pleural thickening shows no significant change. No pneumothorax visualized. Atelectasis is seen bilaterally predominantly in the lung bases, left side greater than right. These findings show no significant interval change. IMPRESSION: No significant change in left pleural thickening and left greater than right atelectasis. No pneumothorax visualized. Electronically Signed   By: Earle Gell M.D.   On: 10/31/2017 10:24   Dg Chest Port 1 View  Result Date: 10/30/2017 CLINICAL DATA:  Prior VATS.  Adjustment of endotracheal tube. EXAM: PORTABLE CHEST 1 VIEW COMPARISON:  Prior exam same day. FINDINGS: Endotracheal tube has been partially withdrawn. Tip is now 2 cm above the carina in good anatomic position. NG tube noted with tip below left hemidiaphragm. Right IJ line noted with tip at cavoatrial junction. Three left chest tubes in stable position. No pneumothorax. Stable atelectatic changes and possible infiltrate in the left lung. Stable mild right base atelectasis. Small right pleural effusion cannot be excluded . IMPRESSION: Interim interim repositioning of endotracheal tube. Its tip is in good anatomic position 2 cm above the carina. Exam is  otherwise unchanged. Three left chest tubes are in stable position. No pneumothorax.  Electronically Signed   By: Marcello Moores  Register   On: 10/30/2017 12:16   Dg Chest Port 1 View  Addendum Date: 10/30/2017   ADDENDUM REPORT: 10/30/2017 11:27 ADDENDUM: Two new left chest tubes in place, the tips are over the left upper chest. Left lower chest tube is in stable position. No pneumothorax. Electronically Signed   By: Marcello Moores  Register   On: 10/30/2017 11:27   Result Date: 10/30/2017 CLINICAL DATA:  Left chest tube. EXAM: PORTABLE CHEST 1 VIEW COMPARISON:  10/29/2017. FINDINGS: Two new left chest tubes are noted with their tips over the left upper chest. Left lower chest tube is again noted in stable position. No pneumothorax. Previously identified large left pleural effusion has largely been removed. Endotracheal tube tip noted just above the orifice of the right mainstem bronchus. Repositioning suggested, withdrawal of approximately 3 cm should be considered. Right IJ line noted with tip over the right atrium. Diffuse left lung atelectatic changes and/or infiltrate. Right base atelectasis. IMPRESSION: 1. Endotracheal tube tip noted just above the orifice of the right mainstem bronchus. Repositioning should be considered. Withdrawal of endotracheal tube by approximately 3 cm should be considered. 2. Two new left chest tubes in place, the tips are over the left upper chest. No pneumothorax. Interim resolution of previously identified left pleural effusion. Right IJ line in good anatomic position. 3. Diffuse left lung atelectatic changes and/or infiltrate. Mild right base atelectatic change. Critical Value/emergent results were called by telephone at the time of interpretation on 10/30/2017 at 11:04 am to nurse Colletta Maryland, who verbally acknowledged these results. Electronically Signed: By: Marcello Moores  Register On: 10/30/2017 11:05   Dg Abd Portable 1v  Result Date: 10/30/2017 CLINICAL DATA:  Check gastric catheter placement EXAM: PORTABLE ABDOMEN - 1 VIEW COMPARISON:  None. FINDINGS: Gastric catheter  is noted within the stomach. The proximal side port is just beyond the gastroesophageal junction. Multiple left-sided thoracostomy catheters are noted. Nonobstructive bowel gas pattern is seen. IMPRESSION: Catheter noted within the stomach as described. Electronically Signed   By: Inez Catalina M.D.   On: 10/30/2017 12:32    Assessment/Plan: S/P Procedure(s) (LRB): VIDEO BRONCHOSCOPY (N/A) LEFT VIDEO ASSISTED THORACOSCOPY (VATS)/ MINI THORACOTOMY/ DECORTICATION (Left)  1 stable  2 cont aggressive pulm toilet/nebs, wean O2, CXR pending, 240 cc chest tube drainage today, 60 cc yesterday, poss d/c one tube soon- no air leak, place to water seal 3 strep viridens identified- cont current abx 4 d/c foley 5 cont pain meds 6 PCCM managing     LOS: 4 days    John Giovanni 11/01/2017   Chart reviewed, patient examined, agree with above. CXR pending this am. Chest tube output 240 cc this am. Will keep tubes in today.

## 2017-11-02 ENCOUNTER — Encounter (HOSPITAL_COMMUNITY): Payer: Self-pay | Admitting: Cardiothoracic Surgery

## 2017-11-02 LAB — CULTURE, BLOOD (ROUTINE X 2)
Culture: NO GROWTH
Culture: NO GROWTH
Special Requests: ADEQUATE

## 2017-11-02 LAB — CBC WITH DIFFERENTIAL/PLATELET
BASOS PCT: 0 %
Basophils Absolute: 0 10*3/uL (ref 0.0–0.1)
EOS ABS: 0.3 10*3/uL (ref 0.0–0.7)
Eosinophils Relative: 2 %
HEMATOCRIT: 33.9 % — AB (ref 39.0–52.0)
Hemoglobin: 10.8 g/dL — ABNORMAL LOW (ref 13.0–17.0)
LYMPHS ABS: 1.8 10*3/uL (ref 0.7–4.0)
Lymphocytes Relative: 11 %
MCH: 26.9 pg (ref 26.0–34.0)
MCHC: 31.9 g/dL (ref 30.0–36.0)
MCV: 84.5 fL (ref 78.0–100.0)
MONO ABS: 1.3 10*3/uL — AB (ref 0.1–1.0)
Monocytes Relative: 8 %
NEUTROS PCT: 79 %
Neutro Abs: 12.6 10*3/uL — ABNORMAL HIGH (ref 1.7–7.7)
PLATELETS: 393 10*3/uL (ref 150–400)
RBC: 4.01 MIL/uL — AB (ref 4.22–5.81)
RDW: 15.9 % — AB (ref 11.5–15.5)
WBC Morphology: INCREASED
WBC: 16 10*3/uL — AB (ref 4.0–10.5)

## 2017-11-02 LAB — BODY FLUID CULTURE

## 2017-11-02 LAB — GLUCOSE, CAPILLARY
GLUCOSE-CAPILLARY: 104 mg/dL — AB (ref 65–99)
GLUCOSE-CAPILLARY: 111 mg/dL — AB (ref 65–99)
GLUCOSE-CAPILLARY: 113 mg/dL — AB (ref 65–99)
GLUCOSE-CAPILLARY: 113 mg/dL — AB (ref 65–99)
Glucose-Capillary: 111 mg/dL — ABNORMAL HIGH (ref 65–99)
Glucose-Capillary: 94 mg/dL (ref 65–99)

## 2017-11-02 LAB — BASIC METABOLIC PANEL
ANION GAP: 10 (ref 5–15)
BUN: 9 mg/dL (ref 6–20)
CALCIUM: 8.3 mg/dL — AB (ref 8.9–10.3)
CO2: 29 mmol/L (ref 22–32)
CREATININE: 0.73 mg/dL (ref 0.61–1.24)
Chloride: 99 mmol/L — ABNORMAL LOW (ref 101–111)
GFR calc Af Amer: >60 mL/min (ref >60)
GLUCOSE: 112 mg/dL — AB (ref 65–99)
Potassium: 2.9 mmol/L — ABNORMAL LOW (ref 3.5–5.1)
Sodium: 138 mmol/L (ref 135–145)

## 2017-11-02 LAB — CULTURE, BODY FLUID-BOTTLE: CULTURE: NO GROWTH

## 2017-11-02 LAB — CULTURE, BODY FLUID W GRAM STAIN -BOTTLE

## 2017-11-02 LAB — MAGNESIUM: Magnesium: 1.9 mg/dL (ref 1.7–2.4)

## 2017-11-02 MED ORDER — ALBUTEROL SULFATE (2.5 MG/3ML) 0.083% IN NEBU: 2.5000 mg | INHALATION_SOLUTION | RESPIRATORY_TRACT | Status: DC | PRN

## 2017-11-02 MED ORDER — INSULIN ASPART 100 UNIT/ML ~~LOC~~ SOLN: 0.0000 [IU] | Freq: Every day | SUBCUTANEOUS | Status: DC

## 2017-11-02 MED ORDER — FAMOTIDINE 20 MG PO TABS
20.0000 mg | ORAL_TABLET | Freq: Two times a day (BID) | ORAL | Status: DC
  Administered 2017-11-02 – 2017-11-09 (×14): 20 mg via ORAL
  Filled 2017-11-02 (×15): qty 1

## 2017-11-02 MED ORDER — INSULIN ASPART 100 UNIT/ML ~~LOC~~ SOLN
0.0000 [IU] | Freq: Three times a day (TID) | SUBCUTANEOUS | Status: DC
  Administered 2017-11-03: 1 [IU] via SUBCUTANEOUS

## 2017-11-02 MED ORDER — CITALOPRAM HYDROBROMIDE 20 MG PO TABS: 20.0000 mg | ORAL_TABLET | Freq: Every day | ORAL | Status: DC

## 2017-11-02 MED ORDER — POTASSIUM CHLORIDE CRYS ER 20 MEQ PO TBCR
40.0000 meq | EXTENDED_RELEASE_TABLET | Freq: Two times a day (BID) | ORAL | Status: AC
  Administered 2017-11-02 – 2017-11-03 (×3): 40 meq via ORAL
  Filled 2017-11-02 (×4): qty 2

## 2017-11-02 NOTE — Progress Notes (Signed)
Patient ID: Elijah Gilles[place], male   DOB: 11-Jun-1971, 46 y.o.   MRN: [id number] TCTS DAILY ICU PROGRESS NOTE                   [address]            [place] [address]          [phone]   3 Days Post-Op Procedure(s) (LRB): VIDEO BRONCHOSCOPY (N/A) LEFT VIDEO ASSISTED THORACOSCOPY (VATS)/ MINI THORACOTOMY/ DECORTICATION (Left)  Total Length of Stay:  LOS: 5 days   Subjective: Feels better today   Objective: Vital signs in last 24 hours: Temp:  [97.5 F (36.4 C)-98.5 F (36.9 C)] 98.5 F (36.9 C) (12/31 0728) Pulse Rate:  [101-117] 104 (12/31 0500) Cardiac Rhythm: Sinus tachycardia (12/31 0400) Resp:  [19-31] 27 (12/31 0200) BP: (137-166)/(82-103) 150/90 (12/31 0500) SpO2:  [94 %-100 %] 98 % (12/31 0500)  Filed Weights   10/30/17 1030 10/30/17 1112 10/31/17 0500  Weight: 280 lb 6.8 oz (127.2 kg) 280 lb 6.8 oz (127.2 kg) 285 lb 15 oz (129.7 kg)    Weight change:    Hemodynamic parameters for last 24 hours:    Intake/Output from previous day: 12/30 0701 - 12/31 0700 In: 1390 [P.O.:840; IV Piggyback:550] Out: 2851 [Urine:2520; Stool:1; Chest Tube:330]  Intake/Output this shift: No intake/output data recorded.  Current Meds: Scheduled Meds: . acetaminophen  1,000 mg Oral Q6H   Or  . acetaminophen (TYLENOL) oral liquid 160 mg/5 mL  1,000 mg Oral Q6H  . atorvastatin  20 mg Oral Daily  . bisacodyl  10 mg Oral Daily  . Chlorhexidine Gluconate Cloth  6 each Topical Daily  . citalopram  20 mg Oral Daily  . sennosides  5 mL Per Tube QHS   And  . docusate  50 mg Per Tube QHS  . enoxaparin (LOVENOX) injection  40 mg Subcutaneous Daily  . insulin aspart  0-24 Units Subcutaneous Q4H  . mouth rinse  15 mL Mouth Rinse BID  . nicotine  21 mg Transdermal Daily  . sodium chloride flush  10-40 mL Intracatheter Q12H   Continuous Infusions: . ampicillin-sulbactam (UNASYN) IV Stopped (11/02/17 0530)  . famotidine (PEPCID) IV Stopped (11/01/17 2239)  .  potassium chloride     PRN Meds:.albuterol, fentaNYL (SUBLIMAZE) injection, lidocaine (PF), oxyCODONE, potassium chloride, sodium chloride flush, traMADol  General appearance: alert and cooperative Neurologic: intact Heart: regular rate and rhythm, S1, S2 normal, no murmur, click, rub or gallop Lungs: diminished breath sounds LLL Abdomen: soft, non-tender; bowel sounds normal; no masses,  no organomegaly Extremities: extremities normal, atraumatic, no cyanosis or edema and Homans sign is negative, no sign of DVT Wound: no air leak from chest tubes   Lab Results: CBC: Recent Labs    10/31/17 0418 11/01/17 0610  WBC 14.7* 15.9*  HGB 9.2* 10.4*  HCT 29.7* 32.8*  PLT 352 421*   BMET:  Recent Labs    10/31/17 0418 11/01/17 0610  NA 138 137  K 3.8 3.0*  CL 104 99*  CO2 28 29  GLUCOSE 151* 111*  BUN 17 15  CREATININE 0.81 0.72  CALCIUM 8.3* 8.0*    CMET: Lab Results  Component Value Date   WBC 15.9 (H) 11/01/2017   HGB 10.4 (L) 11/01/2017   HCT 32.8 (L) 11/01/2017   PLT 421 (H) 11/01/2017   GLUCOSE 111 (H) 11/01/2017   TRIG 143 10/30/2017   ALT 20 11/01/2017   AST 27 11/01/2017  NA 137 11/01/2017   K 3.0 (L) 11/01/2017   CL 99 (L) 11/01/2017   CREATININE 0.72 11/01/2017   BUN 15 11/01/2017   CO2 29 11/01/2017   TSH 2.115 10/29/2017   INR 1.17 10/29/2017      PT/INR: No results for input(s): LABPROT, INR in the last 72 hours. Radiology: Dg Chest Port 1 View  Result Date: 11/01/2017 CLINICAL DATA:  Postsurgical follow-up EXAM: PORTABLE CHEST 1 VIEW COMPARISON:  Chest radiograph from one day prior. FINDINGS: Right internal jugular central venous catheter terminates in the lower third of the superior vena cava. Two left apical and single left basilar chest tubes are stable in position. Stable cardiomediastinal silhouette with normal heart size. No pneumothorax. No right pleural effusion. Stable volume loss in the left hemithorax. Hazy and curvilinear opacities  throughout the left lung are stable. Stable left pleural thickening. Mild right basilar atelectasis is minimally increased. IMPRESSION: 1. No pneumothorax.  Stable left chest tubes . 2. Stable volume loss in left hemithorax with stable hazy and curvilinear left lung opacities, probably a combination of scarring and atelectasis . Stable left pleural thickening. 3. Mild right basilar atelectasis, minimally increased. Electronically Signed   By: Ilona Sorrel M.D.   On: 11/01/2017 11:24   Recent Results (from the past 240 hour(s))  Blood Culture (routine x 2)     Status: None (Preliminary result)   Collection Time: 10/28/17 12:45 AM  Result Value Ref Range Status   Specimen Description BLOOD LEFT ANTECUBITAL  Final   Special Requests BOTTLES DRAWN AEROBIC AND ANAEROBIC BCAV  Final   Culture   Final    NO GROWTH 4 DAYS Performed at [facility], [address] [place], [place] [address]    Report Status PENDING  Incomplete  Blood Culture (routine x 2)     Status: None (Preliminary result)   Collection Time: 10/28/17  1:50 AM  Result Value Ref Range Status   Specimen Description BLOOD RIGHT WRIST ARTERIAL  Final   Special Requests IN PEDIATRIC BOTTLE Blood Culture adequate volume  Final   Culture   Final    NO GROWTH 4 DAYS Performed at [facility], [place] [address], [place],  [address]    Report Status PENDING  Incomplete  MRSA PCR Screening     Status: None   Collection Time: 10/28/17  4:43 AM  Result Value Ref Range Status   MRSA by PCR NEGATIVE NEGATIVE Final    Comment:        The GeneXpert MRSA Assay (FDA approved for NASAL specimens only), is one component of a comprehensive MRSA colonization surveillance program. It is not intended to diagnose MRSA infection nor to guide or monitor treatment for MRSA infections.   Respiratory Panel by PCR     Status: None   Collection Time: 10/28/17  8:09 AM  Result Value Ref Range Status   Adenovirus NOT DETECTED NOT  DETECTED Final   Coronavirus 229E NOT DETECTED NOT DETECTED Final   Coronavirus HKU1 NOT DETECTED NOT DETECTED Final   Coronavirus NL63 NOT DETECTED NOT DETECTED Final   Coronavirus OC43 NOT DETECTED NOT DETECTED Final   Metapneumovirus NOT DETECTED NOT DETECTED Final   Rhinovirus / Enterovirus NOT DETECTED NOT DETECTED Final   Influenza A NOT DETECTED NOT DETECTED Final   Influenza B NOT DETECTED NOT DETECTED Final   Parainfluenza Virus 1 NOT DETECTED NOT DETECTED Final   Parainfluenza Virus 2 NOT DETECTED NOT DETECTED Final  Parainfluenza Virus 3 NOT DETECTED NOT DETECTED Final   Parainfluenza Virus 4 NOT DETECTED NOT DETECTED Final   Respiratory Syncytial Virus NOT DETECTED NOT DETECTED Final   Bordetella pertussis NOT DETECTED NOT DETECTED Final   Chlamydophila pneumoniae NOT DETECTED NOT DETECTED Final   Mycoplasma pneumoniae NOT DETECTED NOT DETECTED Final  Culture, sputum-assessment     Status: None   Collection Time: 10/28/17  1:09 PM  Result Value Ref Range Status   Specimen Description SPUTUM  Final   Special Requests NONE  Final   Sputum evaluation   Final    Sputum specimen not acceptable for testing.  Please recollect.   Gram Stain Report Called to,Read Back By and Verified With: RN Nichola Sizer [id number] MLM    Report Status 10/28/2017 FINAL  Final  Culture, body fluid-bottle     Status: None (Preliminary result)   Collection Time: 10/28/17  4:01 PM  Result Value Ref Range Status   Specimen Description FLUID LEFT PLEURAL  Final   Special Requests BOTTLES DRAWN AEROBIC AND ANAEROBIC  Final   Culture NO GROWTH 4 DAYS  Final   Report Status PENDING  Incomplete  Gram stain     Status: None   Collection Time: 10/28/17  4:01 PM  Result Value Ref Range Status   Specimen Description FLUID LEFT PLEURAL  Final   Special Requests NONE  Final   Gram Stain   Final    ABUNDANT WBC PRESENT,BOTH PMN AND MONONUCLEAR NO ORGANISMS SEEN    Report Status 10/28/2017 FINAL  Final    Body fluid culture     Status: None (Preliminary result)   Collection Time: 10/28/17  7:21 PM  Result Value Ref Range Status   Specimen Description PLEURAL  Final   Special Requests FLUID  Final   Gram Stain   Final    ABUNDANT WBC PRESENT,BOTH PMN AND MONONUCLEAR NO ORGANISMS SEEN    Culture   Final    FEW VIRIDANS STREPTOCOCCUS CRITICAL RESULT CALLED TO, READ BACK BY AND VERIFIED WITH: S. TUTTLE RN, AT 1937 10/31/17 BY D. VANHOOK REGARDING CULTURE GROWTH SUSCEPTIBILITIES TO FOLLOW    Report Status PENDING  Incomplete  Culture, bal-quantitative     Status: Abnormal   Collection Time: 10/29/17 11:15 AM  Result Value Ref Range Status   Specimen Description BRONCHIAL ALVEOLAR LAVAGE  Final   Special Requests ADDED TO SAMPLE ALREADY IN LAB  Final   Gram Stain   Final    ABUNDANT WBC PRESENT, PREDOMINANTLY PMN RARE SQUAMOUS EPITHELIAL CELLS PRESENT NO ORGANISMS SEEN    Culture (A)  Final    5,000 COLONIES/mL Consistent with normal respiratory flora.   Report Status 11/01/2017 FINAL  Final  Body fluid culture     Status: None (Preliminary result)   Collection Time: 10/30/17  8:29 AM  Result Value Ref Range Status   Specimen Description FLUID PLEURAL LEFT  Final   Special Requests SPEC A ON SWABS POF VANC AND ZOSYN  Final   Gram Stain   Final    MODERATE WBC PRESENT, PREDOMINANTLY PMN NO ORGANISMS SEEN RESULT CALLED TO, READ BACK BY AND VERIFIED WITH: WAGONER RN AT [id number] ON [id number] BY SJW    Culture   Final    RARE VIRIDANS STREPTOCOCCUS CRITICAL RESULT CALLED TO, READ BACK BY AND VERIFIED WITH: S. TUTTLE RN, AT 2992 10/31/17 BY D. VANHOOK REGARDING CULTURE GROWTH    Report Status PENDING  Incomplete  Aerobic/Anaerobic Culture (surgical/deep wound)  Status: None (Preliminary result)   Collection Time: 10/30/17  8:36 AM  Result Value Ref Range Status   Specimen Description TISSUE LEFT LUNG PLEURAL PEEL  Final   Special Requests POF VANC AND ZOSYN  Final   Gram Stain    Final    MODERATE WBC PRESENT, PREDOMINANTLY PMN NO ORGANISMS SEEN    Culture   Final    NO GROWTH 2 DAYS NO ANAEROBES ISOLATED; CULTURE IN PROGRESS FOR 5 DAYS   Report Status PENDING  Incomplete  Acid Fast Smear (AFB)     Status: None   Collection Time: 10/30/17  8:36 AM  Result Value Ref Range Status   AFB Specimen Processing Comment  Final    Comment: Tissue Grinding and Digestion/Decontamination   Acid Fast Smear Negative  Final    Comment: (NOTE) Performed At: [facility] [place], [place] [id number] Rush Farmer MD AX:[id number]    Source (AFB) TISSUE  Final    Comment: LEFT LUNG     Assessment/Plan: S/P Procedure(s) (LRB): VIDEO BRONCHOSCOPY (N/A) LEFT VIDEO ASSISTED THORACOSCOPY (VATS)/ MINI THORACOTOMY/ DECORTICATION (Left) Mobilize Diuresis Diabetes control Strep viridans  Empyema   D/c anterior chest tube today  Follow up chest xray in am Grace Isaac MD      [address] [place] ,[place] [address] Office [phone]   Beeper [phone]    Grace Isaac 11/02/2017 7:36 AM

## 2017-11-02 NOTE — Op Note (Signed)
NAME:  Elijah [place], [place]            ACCOUNT NO.:  [id number]  MEDICAL RECORD NO.:  [id number]  LOCATION:  2M15C                        FACILITY:  [facility]  PHYSICIAN:  Sheliah PlaneEdward Angelyn Osterberg, MD    DATE OF BIRTH:  1971-07-05  DATE OF PROCEDURE:  10/30/2017 DATE OF DISCHARGE:                              OPERATIVE REPORT   PREOPERATIVE DIAGNOSIS:  Empyema, left chest.  POSTOPERATIVE DIAGNOSIS: Empyema, left chest.  PROCEDURE:  Bronchoscopy, left video-assisted thoracoscopy, mini thoracotomy, decortication.  SURGEON:  Sheliah PlaneEdward Galit Urich, MD  FIRST ASSISTANT:  Jari Favreessa Conte, PA.  BRIEF HISTORY:  The patient is a 46 year old male who presented with increasing shortness of breath, fever and chills.  He was admitted by the hospitalist service and consulted Pulmonary.  The patient was started on IV antibiotics for presumed pneumonia.  On admission chest x- ray, he had a small left pleural effusion.  After thoracentesis was performed, the patient's followup films showed a complete whiteout of the left chest and a left chest tube was placed, approximately 500 mL of straw-colored fluid was removed without blood.  The patient underwent bronchoscopy without any improvement.  Followup CT scan showed increasing both atelectasis and developing empyema in the left chest. Proceeding with emergent left video-assisted thoracoscopy and decortication was recommended to the patient and he agreed and signed informed consent.  DESCRIPTION OF PROCEDURE:  The patient underwent general endotracheal anesthesia with a double-lumen endotracheal tube by Dr. Jacklynn BueMassagee. Through this, appropriate time-out was performed and then through the double-lumen endotracheal tube, fiberoptic bronchoscopy was performed, both down the left and right tracheobronchial tree.  There were secretions removed from the left mainstem bronchus with the tube in good position.  The patient was then turned in lateral decubitus position with the  left side up.  The left side had preoperatively been marked. The left chest tube was removed.  The left chest was prepped with Betadine and draped in usual sterile manner.  A second time-out was performed and we proceeded first with a small port incision in the left chest and entered significant empyema.  This incision was enlarged slightly and we began drainage and decortication of the left chest.  As we were able to open the pleural space up some, we then went back and with the use of a 30-degree video scope, we were able to completely clear the left space particularly adherent and congealed proteinaceous debris was present over the diaphragm and laterally the upper part of the chest was more liquid.  With the left chest decorticated, three chest tubes were left in place, an anterior straight Argyle chest tube and 2 Blake drains, 1 inferior and 1 superiorly.  The most posterior tube was the most inferior tube.  The tubes were secured in place and the lung was reinflated.  Incision was then closed with interrupted 0 Vicryl, running 2-0 Vicryl in subcutaneous tissue, 3-0 subcuticular stitch in skin edges.  Dry dressings were applied.  At completion of procedure, sponge and needle count was reported as correct.  Estimated blood loss approximately 150 mL.  At completion of procedure, there was no significant air leak. Because of the patient's preoperative respiratory condition, it was elected not to try to extubate him.  The double-lumen endotracheal tube was converted to a single endotracheal tube.  The patient was then transferred back to the Medical Intensive Care Unit on the ventilator for further postoperative care.     Sheliah PlaneEdward Myrtie Leuthold, MD     EG/MEDQ  D:  11/01/2017  T:  11/02/2017  Job:  [id number]

## 2017-11-02 NOTE — Progress Notes (Signed)
[facility] TEAM 1 - Stepdown/ICU TEAM  Elijah Gillespie  TGG:[id number] DOB: 01/08/1971 DOA: 10/27/2017 PCP: Beckie Salts, MD    Brief Narrative:  46 y/o male admitted with empyema on 12/26 requiring VATS on 12/28.  Significant Events: 12/26 admit - thoracentesis > chest tube placement per TCTS 12/27 bronchoscopy 12/28 VATS - remained on vent postop 12/29 extubated   Subjective: Feeling better.  Some pain at chest tube site. Denies abdom pain, n/v, or sob.     Assessment & Plan:  Sepsis due to Strep viridans Empyema - LLL CAP narrowed to ampicillin-sulbactam - chest tubes per Thoracic Surgery - clinically improving nicely   Acute respiratory failure with hypoxemia Due to above - cont to wean O2 as able   Hypertension  BP not yet at goal  Hypokalemia  Supplement - Mg is normal   HLD  Thyroid nodule  CT angiogram incidentally showed 5.2 cm right thyroid nodule - TSH normal - free T4 mildly elevated - free T3 essentially normal   Anemia without bleeding Follow Hgb trend   Hyperglycemia No hx of DM - likely due to infection - check A1c  Tobacco abuse  counseled on need to d/c tobacco abuse  DVT prophylaxis: lovenox Code Status: FULL CODE Family Communication: no family present at time of exam  Disposition Plan: SDU  Consultants:  PCCM TCTS  Antimicrobials:  Azithromycin 12/25>12/27 Rocephin 12/25>12/27 Vancomycin 12/27> Zosyn 12/27>  Objective: Blood pressure (!) 131/91, pulse (!) 111, temperature 98.5 F (36.9 C), temperature source Oral, resp. rate (!) 25, height _0  (1.905 m), weight 129.7 kg (285 lb 15 oz), SpO2 99 %.  Intake/Output Summary (Last 24 hours) at 11/02/2017 1052 Last data filed at 11/02/2017 1000 Gross per 24 hour  Intake 990 ml  Output 2425 ml  Net -1435 ml   Filed Weights   10/30/17 1030 10/30/17 1112 10/31/17 0500  Weight: 127.2 kg (280 lb 6.8 oz) 127.2 kg (280 lb 6.8 oz) 129.7 kg (285 lb 15 oz)    Examination: General:  No acute respiratory distress Lungs: poor air movement L base - no wheezing  Cardiovascular: Regular rate and rhythm without murmur gallop or rub normal S1 and S2 Abdomen: Nontender, nondistended, soft, bowel sounds positive, no rebound, no ascites, no appreciable mass Extremities: No significant cyanosis, clubbing, or edema bilateral lower extremities  CBC: Recent Labs  Lab 10/27/17 2325 10/28/17 0551  10/29/17 0309 10/29/17 1916 10/30/17 0316 10/31/17 0418 11/01/17 0610 11/02/17 0658  WBC 16.8* 26.0*   < > 33.4* 23.6* 21.8* 14.7* 15.9* 16.0*  NEUTROABS 12.3* 22.9*  --  29.1*  --   --   --   --  12.6*  HGB 12.6* 10.1*   < > 11.8* 10.7* 10.8* 9.2* 10.4* 10.8*  HCT 40.2 31.7*   < > 37.8* 35.2* 35.7* 29.7* 32.8* 33.9*  MCV 86.3 85.2   < > 87.5 87.1 87.7 85.8 85.9 84.5  PLT 413* 305   < > 335 355 348 352 421* 393   < > = values in this interval not displayed.   Basic Metabolic Panel: Recent Labs  Lab 10/29/17 1916 10/30/17 0316 10/31/17 0418 11/01/17 0610 11/02/17 0658  NA 133* 136 138 137 138  K 3.9 3.7 3.8 3.0* 2.9*  CL 100* 99* 104 99* 99*  CO2 _1 GLUCOSE 155* 113* 151* 111* 112*  BUN _2 CREATININE 0.71 0.77 0.81 0.72 0.73  CALCIUM 8.2* 8.5* 8.3* 8.0*  8.3*  MG  --  1.9 2.0  --  1.9  PHOS  --  2.8 3.3  --   --    GFR: Estimated Creatinine Clearance: 167.4 mL/min (by C-G formula based on SCr of 0.73 mg/dL).  Liver Function Tests: Recent Labs  Lab 10/29/17 1916 11/01/17 0610  AST 15 27  ALT 24 20  ALKPHOS 70 66  BILITOT 0.5 0.6  PROT 5.5* 5.4*  ALBUMIN 1.7* 1.7*    Coagulation Profile: Recent Labs  Lab 10/28/17 0551 10/29/17 1916  INR 1.24 1.17    Cardiac Enzymes: Recent Labs  Lab 10/27/17 2325  TROPONINI <0.03    CBG: Recent Labs  Lab 11/01/17 1646 11/01/17 1953 11/02/17 0027 11/02/17 0351 11/02/17 0727  GLUCAP 107* 112* 111* 113* 111*    Recent Results (from the past 240 hour(s))  Blood Culture (routine x  2)     Status: None (Preliminary result)   Collection Time: 10/28/17 12:45 AM  Result Value Ref Range Status   Specimen Description BLOOD LEFT ANTECUBITAL  Final   Special Requests BOTTLES DRAWN AEROBIC AND ANAEROBIC BCAV  Final   Culture   Final    NO GROWTH 4 DAYS Performed at [facility], [place] [address] [place], [place] [address]    Report Status PENDING  Incomplete  Blood Culture (routine x 2)     Status: None (Preliminary result)   Collection Time: 10/28/17  1:50 AM  Result Value Ref Range Status   Specimen Description BLOOD RIGHT WRIST ARTERIAL  Final   Special Requests IN PEDIATRIC BOTTLE Blood Culture adequate volume  Final   Culture   Final    NO GROWTH 4 DAYS Performed at [facility], [place] [address] [place], [place] [address]    Report Status PENDING  Incomplete  MRSA PCR Screening     Status: None   Collection Time: 10/28/17  4:43 AM  Result Value Ref Range Status   MRSA by PCR NEGATIVE NEGATIVE Final    Comment:        The GeneXpert MRSA Assay (FDA approved for NASAL specimens only), is one component of a comprehensive MRSA colonization surveillance program. It is not intended to diagnose MRSA infection nor to guide or monitor treatment for MRSA infections.   Respiratory Panel by PCR     Status: None   Collection Time: 10/28/17  8:09 AM  Result Value Ref Range Status   Adenovirus NOT DETECTED NOT DETECTED Final   Coronavirus 229E NOT DETECTED NOT DETECTED Final   Coronavirus HKU1 NOT DETECTED NOT DETECTED Final   Coronavirus NL63 NOT DETECTED NOT DETECTED Final   Coronavirus OC43 NOT DETECTED NOT DETECTED Final   Metapneumovirus NOT DETECTED NOT DETECTED Final   Rhinovirus / Enterovirus NOT DETECTED NOT DETECTED Final   Influenza A NOT DETECTED NOT DETECTED Final   Influenza B NOT DETECTED NOT DETECTED Final   Parainfluenza Virus 1 NOT DETECTED NOT DETECTED Final   Parainfluenza Virus 2 NOT DETECTED NOT DETECTED Final   Parainfluenza Virus  3 NOT DETECTED NOT DETECTED Final   Parainfluenza Virus 4 NOT DETECTED NOT DETECTED Final   Respiratory Syncytial Virus NOT DETECTED NOT DETECTED Final   Bordetella pertussis NOT DETECTED NOT DETECTED Final   Chlamydophila pneumoniae NOT DETECTED NOT DETECTED Final   Mycoplasma pneumoniae NOT DETECTED NOT DETECTED Final  Culture, sputum-assessment     Status: None   Collection Time: 10/28/17  1:09 PM  Result Value Ref Range Status  Specimen Description SPUTUM  Final   Special Requests NONE  Final   Sputum evaluation   Final    Sputum specimen not acceptable for testing.  Please recollect.   Gram Stain Report Called to,Read Back By and Verified With: RN Nichola Sizer [phone] MLM    Report Status 10/28/2017 FINAL  Final  Culture, body fluid-bottle     Status: None (Preliminary result)   Collection Time: 10/28/17  4:01 PM  Result Value Ref Range Status   Specimen Description FLUID LEFT PLEURAL  Final   Special Requests BOTTLES DRAWN AEROBIC AND ANAEROBIC  Final   Culture NO GROWTH 4 DAYS  Final   Report Status PENDING  Incomplete  Gram stain     Status: None   Collection Time: 10/28/17  4:01 PM  Result Value Ref Range Status   Specimen Description FLUID LEFT PLEURAL  Final   Special Requests NONE  Final   Gram Stain   Final    ABUNDANT WBC PRESENT,BOTH PMN AND MONONUCLEAR NO ORGANISMS SEEN    Report Status 10/28/2017 FINAL  Final  Body fluid culture     Status: None   Collection Time: 10/28/17  7:21 PM  Result Value Ref Range Status   Specimen Description PLEURAL  Final   Special Requests FLUID  Final   Gram Stain   Final    ABUNDANT WBC PRESENT,BOTH PMN AND MONONUCLEAR NO ORGANISMS SEEN    Culture   Final    FEW VIRIDANS STREPTOCOCCUS CRITICAL RESULT CALLED TO, READ BACK BY AND VERIFIED WITH: S. TUTTLE RN, AT 1937 10/31/17 BY D. VANHOOK REGARDING CULTURE GROWTH    Report Status 11/02/2017 FINAL  Final   Organism ID, Bacteria VIRIDANS STREPTOCOCCUS  Final       Susceptibility   Viridans streptococcus - MIC*    PENICILLIN <=0.06 SENSITIVE Sensitive     CEFTRIAXONE <=0.12 SENSITIVE Sensitive     ERYTHROMYCIN <=0.12 SENSITIVE Sensitive     LEVOFLOXACIN 0.5 SENSITIVE Sensitive     VANCOMYCIN 0.5 SENSITIVE Sensitive     * FEW VIRIDANS STREPTOCOCCUS  Culture, bal-quantitative     Status: Abnormal   Collection Time: 10/29/17 11:15 AM  Result Value Ref Range Status   Specimen Description BRONCHIAL ALVEOLAR LAVAGE  Final   Special Requests ADDED TO SAMPLE ALREADY IN LAB  Final   Gram Stain   Final    ABUNDANT WBC PRESENT, PREDOMINANTLY PMN RARE SQUAMOUS EPITHELIAL CELLS PRESENT NO ORGANISMS SEEN    Culture (A)  Final    5,000 COLONIES/mL Consistent with normal respiratory flora.   Report Status 11/01/2017 FINAL  Final  Body fluid culture     Status: None   Collection Time: 10/30/17  8:29 AM  Result Value Ref Range Status   Specimen Description FLUID PLEURAL LEFT  Final   Special Requests SPEC A ON SWABS POF VANC AND ZOSYN  Final   Gram Stain   Final    MODERATE WBC PRESENT, PREDOMINANTLY PMN NO ORGANISMS SEEN RESULT CALLED TO, READ BACK BY AND VERIFIED WITH: WAGONER RN AT 9024 ON [id number] BY SJW    Culture   Final    RARE VIRIDANS STREPTOCOCCUS CRITICAL RESULT CALLED TO, READ BACK BY AND VERIFIED WITH: S. TUTTLE RN, AT 2992 10/31/17 BY D. VANHOOK REGARDING CULTURE GROWTH SUSCEPTIBILITIES PERFORMED ON PREVIOUS CULTURE WITHIN THE LAST 5 DAYS.    Report Status 11/02/2017 FINAL  Final  Aerobic/Anaerobic Culture (surgical/deep wound)     Status: None (Preliminary result)   Collection  Time: 10/30/17  8:36 AM  Result Value Ref Range Status   Specimen Description TISSUE LEFT LUNG PLEURAL PEEL  Final   Special Requests POF VANC AND ZOSYN  Final   Gram Stain   Final    MODERATE WBC PRESENT, PREDOMINANTLY PMN NO ORGANISMS SEEN    Culture   Final    NO GROWTH 2 DAYS NO ANAEROBES ISOLATED; CULTURE IN PROGRESS FOR 5 DAYS   Report Status PENDING   Incomplete  Acid Fast Smear (AFB)     Status: None   Collection Time: 10/30/17  8:36 AM  Result Value Ref Range Status   AFB Specimen Processing Comment  Final    Comment: Tissue Grinding and Digestion/Decontamination   Acid Fast Smear Negative  Final    Comment: (NOTE) Performed At: [facility] [place], [place] [id number] Rush Farmer MD BS:[id number]    Source (AFB) TISSUE  Final    Comment: LEFT LUNG      Scheduled Meds: . acetaminophen  1,000 mg Oral Q6H   Or  . acetaminophen (TYLENOL) oral liquid 160 mg/5 mL  1,000 mg Oral Q6H  . atorvastatin  20 mg Oral Daily  . bisacodyl  10 mg Oral Daily  . citalopram  20 mg Oral Daily  . sennosides  5 mL Per Tube QHS   And  . docusate  50 mg Per Tube QHS  . enoxaparin (LOVENOX) injection  40 mg Subcutaneous Daily  . insulin aspart  0-24 Units Subcutaneous Q4H  . mouth rinse  15 mL Mouth Rinse BID  . nicotine  21 mg Transdermal Daily     LOS: 5 days   Cherene Altes, MD [facility] Office  [phone] Pager - Text Page per Shea Evans as per below:  On-Call/Text Page:      [email or web address]      password [email or web address]  If 7PM-7AM, please contact night-coverage [email or web address] Password [email or web address] 11/02/2017, 10:52 AM

## 2017-11-02 NOTE — Progress Notes (Signed)
eLink Physician-Brief Progress Note Patient Name: Elijah FabianRichard Gillespie DOB: 1971/07/22 MRN: [id number]   Date of Service  11/02/2017  HPI/Events of Note  Request for AM lab orders.   eICU Interventions  Will order CBC, BMP and Mg++ level in AM.     Intervention Category Minor Interventions: Clinical assessment - ordering diagnostic tests  Elijah Gillespie 11/02/2017, 4:21 AM

## 2017-11-03 ENCOUNTER — Inpatient Hospital Stay (HOSPITAL_COMMUNITY): Payer: Medicaid Other

## 2017-11-03 LAB — BASIC METABOLIC PANEL
ANION GAP: 9 (ref 5–15)
BUN: 8 mg/dL (ref 6–20)
CALCIUM: 8.3 mg/dL — AB (ref 8.9–10.3)
CO2: 31 mmol/L (ref 22–32)
Chloride: 97 mmol/L — ABNORMAL LOW (ref 101–111)
Creatinine, Ser: 0.71 mg/dL (ref 0.61–1.24)
GLUCOSE: 123 mg/dL — AB (ref 65–99)
Potassium: 3.5 mmol/L (ref 3.5–5.1)
SODIUM: 137 mmol/L (ref 135–145)

## 2017-11-03 LAB — CBC
HCT: 33.7 % — ABNORMAL LOW (ref 39.0–52.0)
HEMOGLOBIN: 10.6 g/dL — AB (ref 13.0–17.0)
MCH: 26.8 pg (ref 26.0–34.0)
MCHC: 31.5 g/dL (ref 30.0–36.0)
MCV: 85.1 fL (ref 78.0–100.0)
Platelets: 417 10*3/uL — ABNORMAL HIGH (ref 150–400)
RBC: 3.96 MIL/uL — ABNORMAL LOW (ref 4.22–5.81)
RDW: 16 % — ABNORMAL HIGH (ref 11.5–15.5)
WBC: 18 10*3/uL — AB (ref 4.0–10.5)

## 2017-11-03 LAB — GLUCOSE, CAPILLARY
GLUCOSE-CAPILLARY: 122 mg/dL — AB (ref 65–99)
Glucose-Capillary: 95 mg/dL (ref 65–99)
Glucose-Capillary: 102 mg/dL — ABNORMAL HIGH (ref 65–99)

## 2017-11-03 LAB — HEMOGLOBIN A1C
HEMOGLOBIN A1C: 6 % — AB (ref 4.8–5.6)
MEAN PLASMA GLUCOSE: 125.5 mg/dL

## 2017-11-03 MED ORDER — ENOXAPARIN SODIUM 40 MG/0.4ML ~~LOC~~ SOLN
40.0000 mg | SUBCUTANEOUS | Status: DC
  Administered 2017-11-03 – 2017-11-08 (×6): 40 mg via SUBCUTANEOUS
  Filled 2017-11-03 (×6): qty 0.4

## 2017-11-03 MED ORDER — POTASSIUM CHLORIDE CRYS ER 20 MEQ PO TBCR
40.0000 meq | EXTENDED_RELEASE_TABLET | Freq: Two times a day (BID) | ORAL | Status: AC
  Administered 2017-11-03 – 2017-11-04 (×2): 40 meq via ORAL
  Filled 2017-11-03 (×2): qty 2

## 2017-11-03 MED ORDER — FENTANYL CITRATE (PF) 100 MCG/2ML IJ SOLN
25.0000 ug | INTRAMUSCULAR | Status: DC | PRN
  Administered 2017-11-04 – 2017-11-08 (×19): 50 ug via INTRAVENOUS
  Filled 2017-11-03 (×20): qty 2

## 2017-11-03 MED ORDER — AMLODIPINE BESYLATE 5 MG PO TABS
5.0000 mg | ORAL_TABLET | Freq: Every day | ORAL | Status: DC
  Administered 2017-11-03 – 2017-11-09 (×7): 5 mg via ORAL
  Filled 2017-11-03 (×7): qty 1

## 2017-11-03 NOTE — Progress Notes (Signed)
[facility] TEAM 1 - Stepdown/ICU TEAM  Elijah Gillespie  WUX:[id number] DOB: 17-Apr-1971 DOA: 10/27/2017 PCP: Elijah Salts, MD    Brief Narrative:  47 y/o male admitted with empyema on 12/26 requiring VATS on 12/28.  Significant Events: 12/26 admit - thoracentesis > chest tube placement per TCTS 12/27 bronchoscopy 12/28 VATS - remained on vent postop 12/29 extubated   Subjective: Resting comfortably.  Denies new complaints.  Modest chest pain not unexpected.  No sob, f/c, n/v, or abdom pain.    Assessment & Plan:  Sepsis due to Strep viridans Empyema - LLL CAP narrowed to ampicillin-sulbactam - chest tubes per Thoracic Surgery - clinically improving   Acute respiratory failure with hypoxemia Due to above - cont to wean O2 as able - ambulate  Hypertension  BP not yet at goal - pain likely a factor - adjust meds and follow   Hypokalemia  Cont to supplement to goal of 4.0 - Mg is normal   HLD  Thyroid nodule  CT angiogram incidentally showed 5.2 cm right thyroid nodule - TSH normal - free T4 mildly elevated - free T3 essentially normal - will need thyroid US to determine if FNA is indicated   Anemia without bleeding Hgb stable - most c/w anemia due to acute critical illness    Hyperglycemia No hx of DM - A1c 6.0 therefore not yet c/w DM - due to acute infection  Tobacco abuse  counseled on need to d/c tobacco abuse  Obesity - Body mass index is 35.74 kg/m.  DVT prophylaxis: lovenox Code Status: FULL CODE Family Communication: no family present at time of exam  Disposition Plan: SDU  Consultants:  PCCM TCTS  Antimicrobials:  Azithromycin 12/25>12/27 Rocephin 12/25>12/27 Vancomycin 12/27> 12/30 Zosyn 12/27> 12/30 Amp/Sulbactam 12/30 >  Objective: Blood pressure (!) 140/94, pulse 96, temperature 98 F (36.7 C), temperature source Oral, resp. rate (!) 23, height _0  (1.905 m), weight 129.7 kg (285 lb 15 oz), SpO2 98 %.  Intake/Output Summary (Last 24  hours) at 11/03/2017 0922 Last data filed at 11/03/2017 0852 Gross per 24 hour  Intake 750 ml  Output 1806 ml  Net -1056 ml   Filed Weights   10/30/17 1030 10/30/17 1112 10/31/17 0500  Weight: 127.2 kg (280 lb 6.8 oz) 127.2 kg (280 lb 6.8 oz) 129.7 kg (285 lb 15 oz)    Examination: General: No acute respiratory distress Lungs: poor air movement L base  Cardiovascular: Regular rate and rhythm without murmur  Abdomen: Nontender, overweight, soft, bowel sounds positive, no rebound, no ascites, no appreciable mass Extremities: No significant edema bilateral lower extremities  CBC: Recent Labs  Lab 10/27/17 2325 10/28/17 0551  10/29/17 0309  10/30/17 0316 10/31/17 0418 11/01/17 0610 11/02/17 0658 11/03/17 0151  WBC 16.8* 26.0*   < > 33.4*   < > 21.8* 14.7* 15.9* 16.0* 18.0*  NEUTROABS 12.3* 22.9*  --  29.1*  --   --   --   --  12.6*  --   HGB 12.6* 10.1*   < > 11.8*   < > 10.8* 9.2* 10.4* 10.8* 10.6*  HCT 40.2 31.7*   < > 37.8*   < > 35.7* 29.7* 32.8* 33.9* 33.7*  MCV 86.3 85.2   < > 87.5   < > 87.7 85.8 85.9 84.5 85.1  PLT 413* 305   < > 335   < > 348 352 421* 393 417*   < > = values in this interval not displayed.   Basic  Metabolic Panel: Recent Labs  Lab 10/30/17 0316 10/31/17 0418 11/01/17 0610 11/02/17 0658 11/03/17 0151  NA 136 138 137 138 137  K 3.7 3.8 3.0* 2.9* 3.5  CL 99* 104 99* 99* 97*  CO2 _0 GLUCOSE 113* 151* 111* 112* 123*  BUN _1 CREATININE 0.77 0.81 0.72 0.73 0.71  CALCIUM 8.5* 8.3* 8.0* 8.3* 8.3*  MG 1.9 2.0  --  1.9  --   PHOS 2.8 3.3  --   --   --    GFR: Estimated Creatinine Clearance: 167.4 mL/min (by C-G formula based on SCr of 0.71 mg/dL).  Liver Function Tests: Recent Labs  Lab 10/29/17 1916 11/01/17 0610  AST 15 27  ALT 24 20  ALKPHOS 70 66  BILITOT 0.5 0.6  PROT 5.5* 5.4*  ALBUMIN 1.7* 1.7*    Coagulation Profile: Recent Labs  Lab 10/28/17 0551 10/29/17 1916  INR 1.24 1.17    Cardiac  Enzymes: Recent Labs  Lab 10/27/17 2325  TROPONINI <0.03    CBG: Recent Labs  Lab 11/02/17 0727 11/02/17 1219 11/02/17 1502 11/02/17 2245 11/03/17 0821  GLUCAP 111* 94 104* 113* 122*    Recent Results (from the past 240 hour(s))  Blood Culture (routine x 2)     Status: None   Collection Time: 10/28/17 12:45 AM  Result Value Ref Range Status   Specimen Description BLOOD LEFT ANTECUBITAL  Final   Special Requests BOTTLES DRAWN AEROBIC AND ANAEROBIC BCAV  Final   Culture   Final    NO GROWTH 5 DAYS Performed at [facility], [place] [address] [place], [place] [address]    Report Status 11/02/2017 FINAL  Final  Blood Culture (routine x 2)     Status: None   Collection Time: 10/28/17  1:50 AM  Result Value Ref Range Status   Specimen Description BLOOD RIGHT WRIST ARTERIAL  Final   Special Requests IN PEDIATRIC BOTTLE Blood Culture adequate volume  Final   Culture   Final    NO GROWTH 5 DAYS Performed at [facility], [place] [address], [place],  [address]    Report Status 11/02/2017 FINAL  Final  MRSA PCR Screening     Status: None   Collection Time: 10/28/17  4:43 AM  Result Value Ref Range Status   MRSA by PCR NEGATIVE NEGATIVE Final    Comment:        The GeneXpert MRSA Assay (FDA approved for NASAL specimens only), is one component of a comprehensive MRSA colonization surveillance program. It is not intended to diagnose MRSA infection nor to guide or monitor treatment for MRSA infections.   Respiratory Panel by PCR     Status: None   Collection Time: 10/28/17  8:09 AM  Result Value Ref Range Status   Adenovirus NOT DETECTED NOT DETECTED Final   Coronavirus 229E NOT DETECTED NOT DETECTED Final   Coronavirus HKU1 NOT DETECTED NOT DETECTED Final   Coronavirus NL63 NOT DETECTED NOT DETECTED Final   Coronavirus OC43 NOT DETECTED NOT DETECTED Final   Metapneumovirus NOT DETECTED NOT DETECTED Final   Rhinovirus / Enterovirus NOT DETECTED NOT  DETECTED Final   Influenza A NOT DETECTED NOT DETECTED Final   Influenza B NOT DETECTED NOT DETECTED Final   Parainfluenza Virus 1 NOT DETECTED NOT DETECTED Final   Parainfluenza Virus 2 NOT DETECTED NOT DETECTED Final   Parainfluenza Virus 3 NOT DETECTED NOT DETECTED Final  Parainfluenza Virus 4 NOT DETECTED NOT DETECTED Final   Respiratory Syncytial Virus NOT DETECTED NOT DETECTED Final   Bordetella pertussis NOT DETECTED NOT DETECTED Final   Chlamydophila pneumoniae NOT DETECTED NOT DETECTED Final   Mycoplasma pneumoniae NOT DETECTED NOT DETECTED Final  Culture, sputum-assessment     Status: None   Collection Time: 10/28/17  1:09 PM  Result Value Ref Range Status   Specimen Description SPUTUM  Final   Special Requests NONE  Final   Sputum evaluation   Final    Sputum specimen not acceptable for testing.  Please recollect.   Gram Stain Report Called to,Read Back By and Verified With: RN Nichola Sizer [phone] MLM    Report Status 10/28/2017 FINAL  Final  Culture, body fluid-bottle     Status: None   Collection Time: 10/28/17  4:01 PM  Result Value Ref Range Status   Specimen Description FLUID LEFT PLEURAL  Final   Special Requests BOTTLES DRAWN AEROBIC AND ANAEROBIC  Final   Culture NO GROWTH 5 DAYS  Final   Report Status 11/02/2017 FINAL  Final  Gram stain     Status: None   Collection Time: 10/28/17  4:01 PM  Result Value Ref Range Status   Specimen Description FLUID LEFT PLEURAL  Final   Special Requests NONE  Final   Gram Stain   Final    ABUNDANT WBC PRESENT,BOTH PMN AND MONONUCLEAR NO ORGANISMS SEEN    Report Status 10/28/2017 FINAL  Final  Body fluid culture     Status: None   Collection Time: 10/28/17  7:21 PM  Result Value Ref Range Status   Specimen Description PLEURAL  Final   Special Requests FLUID  Final   Gram Stain   Final    ABUNDANT WBC PRESENT,BOTH PMN AND MONONUCLEAR NO ORGANISMS SEEN    Culture   Final    FEW VIRIDANS STREPTOCOCCUS CRITICAL  RESULT CALLED TO, READ BACK BY AND VERIFIED WITH: S. TUTTLE RN, AT 6433 10/31/17 BY D. VANHOOK REGARDING CULTURE GROWTH    Report Status 11/02/2017 FINAL  Final   Organism ID, Bacteria VIRIDANS STREPTOCOCCUS  Final      Susceptibility   Viridans streptococcus - MIC*    PENICILLIN <=0.06 SENSITIVE Sensitive     CEFTRIAXONE <=0.12 SENSITIVE Sensitive     ERYTHROMYCIN <=0.12 SENSITIVE Sensitive     LEVOFLOXACIN 0.5 SENSITIVE Sensitive     VANCOMYCIN 0.5 SENSITIVE Sensitive     * FEW VIRIDANS STREPTOCOCCUS  Culture, bal-quantitative     Status: Abnormal   Collection Time: 10/29/17 11:15 AM  Result Value Ref Range Status   Specimen Description BRONCHIAL ALVEOLAR LAVAGE  Final   Special Requests ADDED TO SAMPLE ALREADY IN LAB  Final   Gram Stain   Final    ABUNDANT WBC PRESENT, PREDOMINANTLY PMN RARE SQUAMOUS EPITHELIAL CELLS PRESENT NO ORGANISMS SEEN    Culture (A)  Final    5,000 COLONIES/mL Consistent with normal respiratory flora.   Report Status 11/01/2017 FINAL  Final  Body fluid culture     Status: None   Collection Time: 10/30/17  8:29 AM  Result Value Ref Range Status   Specimen Description FLUID PLEURAL LEFT  Final   Special Requests SPEC A ON SWABS POF VANC AND ZOSYN  Final   Gram Stain   Final    MODERATE WBC PRESENT, PREDOMINANTLY PMN NO ORGANISMS SEEN RESULT CALLED TO, READ BACK BY AND VERIFIED WITH: [facility] RN AT 2951 ON [id number] BY SJW  Culture   Final    RARE VIRIDANS STREPTOCOCCUS CRITICAL RESULT CALLED TO, READ BACK BY AND VERIFIED WITH: S. TUTTLE RN, AT 3710 10/31/17 BY D. VANHOOK REGARDING CULTURE GROWTH SUSCEPTIBILITIES PERFORMED ON PREVIOUS CULTURE WITHIN THE LAST 5 DAYS.    Report Status 11/02/2017 FINAL  Final  Aerobic/Anaerobic Culture (surgical/deep wound)     Status: None (Preliminary result)   Collection Time: 10/30/17  8:36 AM  Result Value Ref Range Status   Specimen Description TISSUE LEFT LUNG PLEURAL PEEL  Final   Special Requests POF VANC AND  ZOSYN  Final   Gram Stain   Final    MODERATE WBC PRESENT, PREDOMINANTLY PMN NO ORGANISMS SEEN    Culture   Final    NO GROWTH 3 DAYS NO ANAEROBES ISOLATED; CULTURE IN PROGRESS FOR 5 DAYS   Report Status PENDING  Incomplete  Acid Fast Smear (AFB)     Status: None   Collection Time: 10/30/17  8:36 AM  Result Value Ref Range Status   AFB Specimen Processing Comment  Final    Comment: Tissue Grinding and Digestion/Decontamination   Acid Fast Smear Negative  Final    Comment: (NOTE) Performed At: [facility] [place], [place] [id number] Rush Farmer MD EV:[id number]    Source (AFB) TISSUE  Final    Comment: LEFT LUNG      Scheduled Meds: . acetaminophen  1,000 mg Oral Q6H   Or  . acetaminophen (TYLENOL) oral liquid 160 mg/5 mL  1,000 mg Oral Q6H  . atorvastatin  20 mg Oral Daily  . bisacodyl  10 mg Oral Daily  . citalopram  20 mg Oral Daily  . sennosides  5 mL Per Tube QHS   And  . docusate  50 mg Per Tube QHS  . enoxaparin (LOVENOX) injection  40 mg Subcutaneous Daily  . famotidine  20 mg Oral BID  . insulin aspart  0-5 Units Subcutaneous QHS  . insulin aspart  0-9 Units Subcutaneous TID WC  . mouth rinse  15 mL Mouth Rinse BID  . nicotine  21 mg Transdermal Daily     LOS: 6 days   Cherene Altes, MD [facility] Office  [phone] Pager - Text Page per Shea Evans as per below:  On-Call/Text Page:      [email or web address]      password [email or web address]  If 7PM-7AM, please contact night-coverage [email or web address] Password [email or web address] 11/03/2017, 9:22 AM

## 2017-11-03 NOTE — Progress Notes (Signed)
Patient ID: Elijah Gillespie, male   DOB: 1971/01/27, 47 y.o.   MRN: [id number] TCTS DAILY ICU PROGRESS NOTE                   [address]            [place] [id number]          [phone]   4 Days Post-Op Procedure(s) (LRB): VIDEO BRONCHOSCOPY (N/A) LEFT VIDEO ASSISTED THORACOSCOPY (VATS)/ MINI THORACOTOMY/ DECORTICATION (Left)  Total Length of Stay:  LOS: 6 days   Subjective: Overall patient feels better, respiratory status is improved remaining afebrile  Objective: Vital signs in last 24 hours: Temp:  [97.8 F (36.6 C)-98.6 F (37 C)] 98 F (36.7 C) (01/01 0817) Pulse Rate:  [90-111] 96 (01/01 0817) Cardiac Rhythm: Sinus tachycardia (01/01 0720) Resp:  [17-29] 23 (01/01 0817) BP: (131-166)/(85-101) 140/94 (01/01 0817) SpO2:  [95 %-100 %] 98 % (01/01 0817)  Filed Weights   10/30/17 1030 10/30/17 1112 10/31/17 0500  Weight: 280 lb 6.8 oz (127.2 kg) 280 lb 6.8 oz (127.2 kg) 285 lb 15 oz (129.7 kg)    Weight change:    Hemodynamic parameters for last 24 hours:    Intake/Output from previous day: 12/31 0701 - 01/01 0700 In: 850 [P.O.:600; IV Piggyback:250] Out: 1606 [Urine:1575; Stool:1; Chest Tube:30]  Intake/Output this shift: Total I/O In: -  Out: 500 [Urine:500]  Current Meds: Scheduled Meds: . acetaminophen  1,000 mg Oral Q6H   Or  . acetaminophen (TYLENOL) oral liquid 160 mg/5 mL  1,000 mg Oral Q6H  . amLODipine  5 mg Oral Daily  . atorvastatin  20 mg Oral Daily  . bisacodyl  10 mg Oral Daily  . citalopram  20 mg Oral Daily  . sennosides  5 mL Per Tube QHS   And  . docusate  50 mg Per Tube QHS  . enoxaparin (LOVENOX) injection  40 mg Subcutaneous Daily  . famotidine  20 mg Oral BID  . insulin aspart  0-5 Units Subcutaneous QHS  . insulin aspart  0-9 Units Subcutaneous TID WC  . mouth rinse  15 mL Mouth Rinse BID  . nicotine  21 mg Transdermal Daily   Continuous Infusions: . ampicillin-sulbactam (UNASYN) IV Stopped (11/03/17  0545)  . potassium chloride     PRN Meds:.albuterol, fentaNYL (SUBLIMAZE) injection, lidocaine (PF), oxyCODONE, potassium chloride, traMADol  General appearance: alert and cooperative Neurologic: intact Heart: regular rate and rhythm, S1, S2 normal, no murmur, click, rub or gallop Lungs: diminished breath sounds LLL Abdomen: soft, non-tender; bowel sounds normal; no masses,  no organomegaly Extremities: extremities normal, atraumatic, no cyanosis or edema and Homans sign is negative, no sign of DVT Wound: No air leak minimal chest tube drainage now  Lab Results: CBC: Recent Labs    11/02/17 0658 11/03/17 0151  WBC 16.0* 18.0*  HGB 10.8* 10.6*  HCT 33.9* 33.7*  PLT 393 417*   BMET:  Recent Labs    11/02/17 0658 11/03/17 0151  NA 138 137  K 2.9* 3.5  CL 99* 97*  CO2 29 31  GLUCOSE 112* 123*  BUN 9 8  CREATININE 0.73 0.71  CALCIUM 8.3* 8.3*    CMET: Lab Results  Component Value Date   WBC 18.0 (H) 11/03/2017   HGB 10.6 (L) 11/03/2017   HCT 33.7 (L) 11/03/2017   PLT 417 (H) 11/03/2017   GLUCOSE 123 (H) 11/03/2017   TRIG 143 10/30/2017   ALT 20 11/01/2017  AST 27 11/01/2017   NA 137 11/03/2017   K 3.5 11/03/2017   CL 97 (L) 11/03/2017   CREATININE 0.71 11/03/2017   BUN 8 11/03/2017   CO2 31 11/03/2017   TSH 2.115 10/29/2017   INR 1.17 10/29/2017   HGBA1C 6.0 (H) 11/03/2017      PT/INR: No results for input(s): LABPROT, INR in the last 72 hours. Radiology: Dg Chest Port 1 View  Result Date: 11/03/2017 CLINICAL DATA:  Chest tube in place.  Lung surgery. EXAM: PORTABLE CHEST 1 VIEW COMPARISON:  11/01/2017 chest radiograph. FINDINGS: Apical and basilar left chest tubes are in place. Stable cardiomediastinal silhouette with normal heart size. No pneumothorax. No right pleural effusion. Stable volume loss in the left hemithorax with diffuse left pleural thickening. Stable scarring and atelectasis in the left lung. Clear right lung. IMPRESSION: 1. No pneumothorax.  2. Stable pleural thickening and volume loss in the left hemithorax. Stable scarring and atelectasis in the left lung. Electronically Signed   By: Delbert Phenix M.D.   On: 11/03/2017 08:07     Assessment/Plan: S/P Procedure(s) (LRB): VIDEO BRONCHOSCOPY (N/A) LEFT VIDEO ASSISTED THORACOSCOPY (VATS)/ MINI THORACOTOMY/ DECORTICATION (Left) Mobilize Plan to remove the most posterior chest tube today, probably remaining chest tube tomorrow White count still elevated but decreased from the peak of 35,000 Overall the patient feels much better    Elijah Gillespie 11/03/2017 9:44 AM

## 2017-11-04 ENCOUNTER — Inpatient Hospital Stay (HOSPITAL_COMMUNITY): Payer: Medicaid Other

## 2017-11-04 DIAGNOSIS — E7849 Other hyperlipidemia: Secondary | ICD-10-CM

## 2017-11-04 LAB — CBC
HCT: 32.8 % — ABNORMAL LOW (ref 39.0–52.0)
Hemoglobin: 10 g/dL — ABNORMAL LOW (ref 13.0–17.0)
MCH: 26.1 pg (ref 26.0–34.0)
MCHC: 30.5 g/dL (ref 30.0–36.0)
MCV: 85.6 fL (ref 78.0–100.0)
PLATELETS: 376 10*3/uL (ref 150–400)
RBC: 3.83 MIL/uL — ABNORMAL LOW (ref 4.22–5.81)
RDW: 16 % — AB (ref 11.5–15.5)
WBC: 13.6 10*3/uL — ABNORMAL HIGH (ref 4.0–10.5)

## 2017-11-04 LAB — BASIC METABOLIC PANEL
Anion gap: 6 (ref 5–15)
BUN: 6 mg/dL (ref 6–20)
CALCIUM: 8.2 mg/dL — AB (ref 8.9–10.3)
CO2: 27 mmol/L (ref 22–32)
CREATININE: 0.68 mg/dL (ref 0.61–1.24)
Chloride: 103 mmol/L (ref 101–111)
GFR calc Af Amer: >60 mL/min (ref >60)
GLUCOSE: 103 mg/dL — AB (ref 65–99)
Potassium: 3.8 mmol/L (ref 3.5–5.1)
Sodium: 136 mmol/L (ref 135–145)

## 2017-11-04 LAB — AEROBIC/ANAEROBIC CULTURE W GRAM STAIN (SURGICAL/DEEP WOUND): Culture: NO GROWTH

## 2017-11-04 LAB — ACID FAST SMEAR (AFB, MYCOBACTERIA): Acid Fast Smear: NEGATIVE

## 2017-11-04 LAB — RETICULOCYTES
RBC.: 4 MIL/uL — AB (ref 4.22–5.81)
RETIC CT PCT: 2.1 % (ref 0.4–3.1)
Retic Count, Absolute: 84 10*3/uL (ref 19.0–186.0)

## 2017-11-04 MED ORDER — ENSURE ENLIVE PO LIQD
237.0000 mL | Freq: Two times a day (BID) | ORAL | Status: DC
  Administered 2017-11-04 – 2017-11-07 (×5): 237 mL via ORAL

## 2017-11-04 MED ORDER — POTASSIUM CHLORIDE CRYS ER 20 MEQ PO TBCR
20.0000 meq | EXTENDED_RELEASE_TABLET | Freq: Once | ORAL | Status: AC
  Administered 2017-11-04: 20 meq via ORAL
  Filled 2017-11-04: qty 1
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
## 2017-11-04 NOTE — Progress Notes (Addendum)
      [address]       [name] [id number]             [phone]       5 Days Post-Op Procedure(s) (LRB): VIDEO BRONCHOSCOPY (N/A) LEFT VIDEO ASSISTED THORACOSCOPY (VATS)/ MINI THORACOTOMY/ DECORTICATION (Left)  Subjective: Patient with pain at left chest tube site.  Objective: Vital signs in last 24 hours: Temp:  [98 F (36.7 C)-98.5 F (36.9 C)] 98.5 F (36.9 C) (01/02 0051) Pulse Rate:  [85-100] 85 (01/02 0553) Cardiac Rhythm: Normal sinus rhythm (01/02 0700) Resp:  [21-24] 21 (01/02 0553) BP: (132-140)/(65-94) 134/84 (01/02 0553) SpO2:  [95 %-99 %] 98 % (01/02 0553)     Intake/Output from previous day: 01/01 0701 - 01/02 0700 In: 420 [P.O.:320; IV Piggyback:100] Out: 1930 [Urine:1900; Chest Tube:30]   Physical Exam:  Cardiovascular: Slightly tachycardic Pulmonary: Slightly diminished left base and clear on the right Wounds: Dressing is clean and dry.  Chest Tube: to water seal, no air leak  Lab Results: CBC: Recent Labs    11/03/17 0151 11/04/17 0238  WBC 18.0* 13.6*  HGB 10.6* 10.0*  HCT 33.7* 32.8*  PLT 417* 376   BMET:  Recent Labs    11/03/17 0151 11/04/17 0238  NA 137 136  K 3.5 3.8  CL 97* 103  CO2 31 27  GLUCOSE 123* 103*  BUN 8 6  CREATININE 0.71 0.68  CALCIUM 8.3* 8.2*    PT/INR: No results for input(s): LABPROT, INR in the last 72 hours. ABG:  INR: Will add last result for INR, ABG once components are confirmed Will add last 4 CBG results once components are confirmed  Assessment/Plan:  1. CV - SR in the 80's. On Amlodipine 5 mg daily. 2.  Pulmonary - Chest tube with 30 cc of output last 24 hours. Chest tube is to water seal. There is no air leak. CXR this am appears stable (no pneumothorax, atelectasis, volume loss). Will remove remaining chest tube. Check CXR in am. Encourage incentive spirometer. 3. Anemia-H and H stable at 10 and 32.8 4. Supplement potassium 5. ID-On Unasyn for Strep Viridans  empyema. Will need oral antibiotic at discharge.  6. Management per primary  Elijah M ZimmermanPA-C 11/04/2017,8:14 AM   I have seen and examined Elijah Gillespie and agree with the above assessment  and plan.  Delight OvensEdward B Ancel Easler MD [facility] [phone] Office [phone] 11/04/2017 2:50 PM
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
## 2017-11-05 ENCOUNTER — Inpatient Hospital Stay (HOSPITAL_COMMUNITY): Payer: Medicaid Other

## 2017-11-05 DIAGNOSIS — Z938 Other artificial opening status: Secondary | ICD-10-CM

## 2017-11-05 DIAGNOSIS — A491 Streptococcal infection, unspecified site: Secondary | ICD-10-CM

## 2017-11-05 DIAGNOSIS — D638 Anemia in other chronic diseases classified elsewhere: Secondary | ICD-10-CM

## 2017-11-05 LAB — BASIC METABOLIC PANEL
Anion gap: 10 (ref 5–15)
BUN: 6 mg/dL (ref 6–20)
CHLORIDE: 96 mmol/L — AB (ref 101–111)
CO2: 27 mmol/L (ref 22–32)
Calcium: 8.6 mg/dL — ABNORMAL LOW (ref 8.9–10.3)
Creatinine, Ser: 0.79 mg/dL (ref 0.61–1.24)
GFR calc Af Amer: >60 mL/min (ref >60)
GFR calc non Af Amer: >60 mL/min (ref >60)
GLUCOSE: 110 mg/dL — AB (ref 65–99)
Potassium: 4.2 mmol/L (ref 3.5–5.1)
Sodium: 133 mmol/L — ABNORMAL LOW (ref 135–145)

## 2017-11-05 LAB — CBC WITH DIFFERENTIAL/PLATELET
BASOS ABS: 0 10*3/uL (ref 0.0–0.1)
Basophils Relative: 0 %
EOS PCT: 1 %
Eosinophils Absolute: 0.2 10*3/uL (ref 0.0–0.7)
HEMATOCRIT: 35.3 % — AB (ref 39.0–52.0)
Hemoglobin: 11.1 g/dL — ABNORMAL LOW (ref 13.0–17.0)
LYMPHS ABS: 2.4 10*3/uL (ref 0.7–4.0)
LYMPHS PCT: 16 %
MCH: 26.6 pg (ref 26.0–34.0)
MCHC: 31.4 g/dL (ref 30.0–36.0)
MCV: 84.4 fL (ref 78.0–100.0)
MONO ABS: 1.2 10*3/uL — AB (ref 0.1–1.0)
Monocytes Relative: 8 %
NEUTROS ABS: 11.1 10*3/uL — AB (ref 1.7–7.7)
Neutrophils Relative %: 75 %
Platelets: 377 10*3/uL (ref 150–400)
RBC: 4.18 MIL/uL — ABNORMAL LOW (ref 4.22–5.81)
RDW: 16.3 % — AB (ref 11.5–15.5)
WBC: 15 10*3/uL — ABNORMAL HIGH (ref 4.0–10.5)

## 2017-11-05 LAB — MAGNESIUM: MAGNESIUM: 2 mg/dL (ref 1.7–2.4)

## 2017-11-05 LAB — IRON AND TIBC
IRON: 14 ug/dL — AB (ref 45–182)
Saturation Ratios: 6 % — ABNORMAL LOW (ref 17.9–39.5)
TIBC: 238 ug/dL — AB (ref 250–450)
UIBC: 224 ug/dL

## 2017-11-05 LAB — FOLATE: FOLATE: 9 ng/mL (ref >5.9)

## 2017-11-05 LAB — VITAMIN B12: VITAMIN B 12: 830 pg/mL (ref 180–914)

## 2017-11-05 LAB — FERRITIN: Ferritin: 342 ng/mL — ABNORMAL HIGH (ref 24–336)

## 2017-11-05 MED ORDER — METHOCARBAMOL 1000 MG/10ML IJ SOLN
500.0000 mg | Freq: Three times a day (TID) | INTRAVENOUS | Status: DC | PRN
  Administered 2017-11-05: 500 mg via INTRAVENOUS
  Filled 2017-11-05 (×2): qty 5
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
## 2017-11-05 NOTE — Progress Notes (Signed)
Patient ID: Elijah FabianRichard Hartog, male   DOB: May 11, 1971, 47 y.o.   MRN: [id number]      [address]       [place] [id number]             [phone]                 6 Days Post-Op Procedure(s) (LRB): VIDEO BRONCHOSCOPY (N/A) LEFT VIDEO ASSISTED THORACOSCOPY (VATS)/ MINI THORACOTOMY/ DECORTICATION (Left)  LOS: 8 days   Subjective: Patient awake alert just back from x-ray this morning, still with some cough  Objective: Vital signs in last 24 hours: Patient Vitals for the past 24 hrs:  BP Temp Temp src Pulse Resp SpO2  11/05/17 0916 128/71 - - - - -  11/05/17 0710 128/71 98.6 F (37 C) Oral - (!) 26 -  11/05/17 0306 136/80 98.6 F (37 C) Oral (!) 117 (!) 31 92 %  11/04/17 2308 (!) 145/83 99 F (37.2 C) Oral - (!) 26 100 %  11/04/17 1924 125/71 99 F (37.2 C) Oral (!) 115 (!) 25 94 %  11/04/17 1646 (!) 142/79 97.9 F (36.6 C) Oral (!) 126 (!) 32 97 %  11/04/17 1237 129/82 98.8 F (37.1 C) Oral 95 (!) 29 96 %    Filed Weights   10/30/17 1030 10/30/17 1112 10/31/17 0500  Weight: 280 lb 6.8 oz (127.2 kg) 280 lb 6.8 oz (127.2 kg) 285 lb 15 oz (129.7 kg)    Hemodynamic parameters for last 24 hours:    Intake/Output from previous day: 01/02 0701 - 01/03 0700 In: 960 [P.O.:240; IV Piggyback:700] Out: 1725 [Urine:1725] Intake/Output this shift: No intake/output data recorded.  Scheduled Meds: . amLODipine  5 mg Oral Daily  . atorvastatin  20 mg Oral Daily  . bisacodyl  10 mg Oral Daily  . citalopram  20 mg Oral Daily  . sennosides  5 mL Per Tube QHS   And  . docusate  50 mg Per Tube QHS  . enoxaparin (LOVENOX) injection  40 mg Subcutaneous Q24H  . famotidine  20 mg Oral BID  . feeding supplement (ENSURE ENLIVE)  237 mL Oral BID BM  . mouth rinse  15 mL Mouth Rinse BID  . nicotine  21 mg Transdermal Daily   Continuous Infusions: . ampicillin-sulbactam (UNASYN) IV Stopped (11/05/17 0528)   PRN Meds:.albuterol, fentaNYL (SUBLIMAZE) injection,  lidocaine (PF), oxyCODONE, traMADol  General appearance: alert and cooperative Neurologic: intact Heart: regular rate and rhythm, S1, S2 normal, no murmur, click, rub or gallop Lungs: diminished breath sounds LLL Abdomen: soft, non-tender; bowel sounds normal; no masses,  no organomegaly Extremities: extremities normal, atraumatic, no cyanosis or edema and Homans sign is negative, no sign of DVT Wound: Chest tube sites are intact  Lab Results: CBC: Recent Labs    11/04/17 0238 11/05/17 0845  WBC 13.6* 15.0*  HGB 10.0* 11.1*  HCT 32.8* 35.3*  PLT 376 377   BMET:  Recent Labs    11/04/17 0238 11/05/17 0845  NA 136 133*  K 3.8 4.2  CL 103 96*  CO2 27 27  GLUCOSE 103* 110*  BUN 6 6  CREATININE 0.68 0.79  CALCIUM 8.2* 8.6*    PT/INR: No results for input(s): LABPROT, INR in the last 72 hours.   Radiology Dg Chest 2 View  Result Date: 11/05/2017 CLINICAL DATA:  Chest injury.  Recent chest tube removal. EXAM: CHEST  2 VIEW COMPARISON:  11/04/2017 . FINDINGS: Interim removal of left chest  tube. No pneumothorax. Mediastinum and hilar structures are stable. Heart size stable. Stable left lung atelectatic change. Mild left lung infiltrate cannot be excluded. Stable left-sided pleural thickening and elevation of left hemidiaphragm. No pneumothorax. IMPRESSION: Interim removal of left chest tube. No pneumothorax. Stable atelectatic changes in the left lung. Mild left lung infiltrate cannot be excluded. Stable left-sided pleural thickening and elevation left hemidiaphragm. Electronically Signed   By: Maisie Fus  Register   On: 11/05/2017 10:40   Dg Chest Port 1 View  Result Date: 11/04/2017 CLINICAL DATA:  Follow-up examination for chest tube. EXAM: PORTABLE CHEST 1 VIEW COMPARISON:  Prior radiograph from 11/03/2017. FINDINGS: Stable cardiomegaly.  Mediastinal silhouette within normal limits. Lungs hypoinflated. Left-sided chest tube remains in place with tip projecting over the lateral left  upper lobe, stable. There has been interval removal of a basilar left-sided chest tube. Volume loss within the left hemithorax. Similar left pleural thickening with associated left basilar opacity. No appreciable pneumothorax. Right lung remains largely clear. Osseous structures unchanged. IMPRESSION: 1. Stable position of left apical chest tube with tip overlying the left upper lobe. Interval removal of additional basilar left-sided chest tube. No appreciable pneumothorax. 2. Similar volume loss within the left lung with associated left pleural thickening and left basilar atelectasis and/or scarring. Electronically Signed   By: Rise Mu M.D.   On: 11/04/2017 04:04    Assessment/Plan: S/P Procedure(s) (LRB): VIDEO BRONCHOSCOPY (N/A) LEFT VIDEO ASSISTED THORACOSCOPY (VATS)/ MINI THORACOTOMY/ DECORTICATION (Left) Slowly recovering from left empyema, continues on IV antibiotics Chest x-ray stable [name] MD 11/05/2017 10:52 AM
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
## 2017-11-06 LAB — LIPID PANEL
CHOL/HDL RATIO: 4.1 ratio
Cholesterol: 118 mg/dL (ref 0–200)
HDL: 29 mg/dL — AB (ref >40)
LDL CALC: 72 mg/dL (ref 0–99)
TRIGLYCERIDES: 83 mg/dL (ref <150)
VLDL: 17 mg/dL (ref 0–40)

## 2017-11-06 NOTE — Progress Notes (Signed)
Patient ID: Elijah FabianRichard Matranga, male   DOB: Jun 07, 1971, 47 y.o.   MRN: [id number] TCTS DAILY ICU PROGRESS NOTE                   [address]            [place] [id number]          [phone]   7 Days Post-Op Procedure(s) (LRB): VIDEO BRONCHOSCOPY (N/A) LEFT VIDEO ASSISTED THORACOSCOPY (VATS)/ MINI THORACOTOMY/ DECORTICATION (Left)  Total Length of Stay:  LOS: 9 days   Subjective: Patient continues to improve, ambulating, overall left chest discomfort has decreased  Objective: Vital signs in last 24 hours: Temp:  [98 F (36.7 C)-101.1 F (38.4 C)] 101.1 F (38.4 C) (01/04 0332) Pulse Rate:  [117-119] 119 (01/04 0332) Cardiac Rhythm: Normal sinus rhythm (01/04 0714) Resp:  [24-31] 31 (01/04 0332) BP: (125-138)/(71-82) 125/77 (01/04 0332) SpO2:  [89 %] 89 % (01/04 0332)  Filed Weights   10/30/17 1030 10/30/17 1112 10/31/17 0500  Weight: 280 lb 6.8 oz (127.2 kg) 280 lb 6.8 oz (127.2 kg) 285 lb 15 oz (129.7 kg)    Weight change:    Hemodynamic parameters for last 24 hours:    Intake/Output from previous day: 01/03 0701 - 01/04 0700 In: 1295 [P.O.:840; IV Piggyback:455] Out: 1825 [Urine:1825]  Intake/Output this shift: No intake/output data recorded.  Current Meds: Scheduled Meds: . amLODipine  5 mg Oral Daily  . atorvastatin  20 mg Oral Daily  . bisacodyl  10 mg Oral Daily  . citalopram  20 mg Oral Daily  . sennosides  5 mL Per Tube QHS   And  . docusate  50 mg Per Tube QHS  . enoxaparin (LOVENOX) injection  40 mg Subcutaneous Q24H  . famotidine  20 mg Oral BID  . feeding supplement (ENSURE ENLIVE)  237 mL Oral BID BM  . nicotine  21 mg Transdermal Daily   Continuous Infusions: . ampicillin-sulbactam (UNASYN) IV Stopped (11/06/17 0431)  . methocarbamol (ROBAXIN)  IV Stopped (11/05/17 1959)   PRN Meds:.albuterol, fentaNYL (SUBLIMAZE) injection, lidocaine (PF), methocarbamol (ROBAXIN)  IV, oxyCODONE, traMADol  General appearance: alert,  cooperative and no distress Neurologic: intact Heart: regular rate and rhythm, S1, S2 normal, no murmur, click, rub or gallop Lungs: diminished breath sounds LLL Abdomen: soft, non-tender; bowel sounds normal; no masses,  no organomegaly Extremities: extremities normal, atraumatic, no cyanosis or edema and Homans sign is negative, no sign of DVT Wound: Incisions and chest tube sites are intact without obvious infection  Lab Results: CBC: Recent Labs    11/04/17 0238 11/05/17 0845  WBC 13.6* 15.0*  HGB 10.0* 11.1*  HCT 32.8* 35.3*  PLT 376 377   BMET:  Recent Labs    11/04/17 0238 11/05/17 0845  NA 136 133*  K 3.8 4.2  CL 103 96*  CO2 27 27  GLUCOSE 103* 110*  BUN 6 6  CREATININE 0.68 0.79  CALCIUM 8.2* 8.6*    CMET: Lab Results  Component Value Date   WBC 15.0 (H) 11/05/2017   HGB 11.1 (L) 11/05/2017   HCT 35.3 (L) 11/05/2017   PLT 377 11/05/2017   GLUCOSE 110 (H) 11/05/2017   CHOL 118 11/06/2017   TRIG 83 11/06/2017   HDL 29 (L) 11/06/2017   LDLCALC 72 11/06/2017   ALT 20 11/01/2017   AST 27 11/01/2017   NA 133 (L) 11/05/2017   K 4.2 11/05/2017   CL 96 (L) 11/05/2017   CREATININE  0.79 11/05/2017   BUN 6 11/05/2017   CO2 27 11/05/2017   TSH 2.115 10/29/2017   INR 1.17 10/29/2017   HGBA1C 6.0 (H) 11/03/2017      PT/INR: No results for input(s): LABPROT, INR in the last 72 hours. Radiology: Dg Chest 2 View  Result Date: 11/05/2017 CLINICAL DATA:  Chest injury.  Recent chest tube removal. EXAM: CHEST  2 VIEW COMPARISON:  11/04/2017 . FINDINGS: Interim removal of left chest tube. No pneumothorax. Mediastinum and hilar structures are stable. Heart size stable. Stable left lung atelectatic change. Mild left lung infiltrate cannot be excluded. Stable left-sided pleural thickening and elevation of left hemidiaphragm. No pneumothorax. IMPRESSION: Interim removal of left chest tube. No pneumothorax. Stable atelectatic changes in the left lung. Mild left lung  infiltrate cannot be excluded. Stable left-sided pleural thickening and elevation left hemidiaphragm. Electronically Signed   By: Maisie Fus  Register   On: 11/05/2017 10:40     Assessment/Plan: S/P Procedure(s) (LRB): VIDEO BRONCHOSCOPY (N/A) LEFT VIDEO ASSISTED THORACOSCOPY (VATS)/ MINI THORACOTOMY/ DECORTICATION (Left) Continue to increase ambulation Continue IV antibiotics for now Consider transfer for [place] home 1-2 days     [name] 11/06/2017 8:00 AM

## 2017-11-06 NOTE — Care Management Note (Signed)
Case Management Note  Patient Details  Name: Elijah Gillespie MRN: [id number] Date of Birth: May 27, 1971  Subjective/Objective:      Pt admitted with SOB - now [name] VATS                Action/Plan:  PTA independent for home with wife.  Pt [place] [place] [facility] [place].  As of November 03 2017 - pt has active [employer] insurance however doesn't have insurance cards as of yet.  Pt has verified that PCP will accept insurance - pt will contact [facility] when card arrive to give pertinent information.  CM will continue to follow for discharge needs   Expected Discharge Date:                  Expected Discharge Plan:  Home/Self Care  In-House Referral:     Discharge planning Services  CM Consult  Post Acute Care Choice:    Choice offered to:     DME Arranged:    DME Agency:     HH Arranged:    HH Agency:     Status of Service:  In process, will continue to follow  If discussed at Long Length of Stay Meetings, dates discussed:    Additional Comments:  Elijah Gillespie, Elijah Terrien S, RN 11/06/2017, 9:29 AM

## 2017-11-06 NOTE — Progress Notes (Signed)
[place] TEAM 1 - Stepdown/ICU TEAM  Elijah Gillespie  YQM:[id number] DOB: 1971/08/27 DOA: 10/27/2017 PCP: Beckie Salts, MD    Brief Narrative:  47 y/o male admitted with empyema on 12/26 requiring VATS on 12/28.  Significant Events: 12/26 admit - thoracentesis > chest tube placement per TCTS 12/27 bronchoscopy 12/28 VATS - remained on vent postop 12/29 extubated   Subjective: Reports some expected chest wall pain but otherwise has not complaints.  Denies sob, n/v, or abdom pain.  Would prefer to go ahead and get thyroid FNA done while here if possible.    Assessment & Plan:  Sepsis due to Strep viridans Empyema - LLL CAP narrowed to ampicillin-sulbactam - chest tubes now all removed per Thoracic Surgery - clinically improving - cont IV abx as suggested by TCTS  Acute respiratory failure with hypoxemia Due to above - resolved w/ pt now on RA   Hypertension  BP now well controlled   Hypokalemia  Corrected   HLD Cont Lipitor   Thyroid nodule  CT angiogram incidentally showed 5.2 cm right thyroid nodule - TSH normal - free T4 mildly elevated - free T3 essentially normal - thyroid US noted one R mid thyroid nodule which meets criteria for bx - pt desires bx now if possible - FNA ordered   Anemia without bleeding Hgb stable/improving - most c/w anemia due to acute critical illness    Hyperglycemia No hx of DM - A1c 6.0 therefore not yet c/w DM - due to acute infection  Tobacco abuse  counseled on need to d/c tobacco abuse  Obesity - Body mass index is 35.74 kg/m.  DVT prophylaxis: lovenox Code Status: FULL CODE Family Communication: no family present at time of exam  Disposition Plan: stable for transfer - cont IV abx - ambulate - arrange FNA asap but will not prolong hospital stay if otherwise becomes clear for d/c   Consultants:  PCCM TCTS  Antimicrobials:  Azithromycin 12/25>12/27 Rocephin 12/25>12/27 Vancomycin 12/27> 12/30 Zosyn 12/27>  12/30 Amp/Sulbactam 12/30 >  Objective: Blood pressure 129/83, pulse (!) 110, temperature 98.2 F (36.8 C), temperature source Oral, resp. rate (!) 23, height _0  (1.905 m), weight 129.7 kg (285 lb 15 oz), SpO2 93 %.  Intake/Output Summary (Last 24 hours) at 11/06/2017 1009 Last data filed at 11/06/2017 0805 Gross per 24 hour  Intake 1295 ml  Output 2125 ml  Net -830 ml   Filed Weights   10/30/17 1030 10/30/17 1112 10/31/17 0500  Weight: 127.2 kg (280 lb 6.8 oz) 127.2 kg (280 lb 6.8 oz) 129.7 kg (285 lb 15 oz)    Examination: General: No acute respiratory distress Lungs: poor air movement L base - no wheezing   Cardiovascular: RRR -  No M or rub  Abdomen: NT/ND, soft, bs+, no mass Extremities: No significant edema B LE   CBC: Recent Labs  Lab 11/01/17 0610 11/02/17 0658 11/03/17 0151 11/04/17 0238 11/05/17 0845  WBC 15.9* 16.0* 18.0* 13.6* 15.0*  NEUTROABS  --  12.6*  --   --  11.1*  HGB 10.4* 10.8* 10.6* 10.0* 11.1*  HCT 32.8* 33.9* 33.7* 32.8* 35.3*  MCV 85.9 84.5 85.1 85.6 84.4  PLT 421* 393 417* 376 889   Basic Metabolic Panel: Recent Labs  Lab 10/31/17 0418 11/01/17 0610 11/02/17 0658 11/03/17 0151 11/04/17 0238 11/05/17 0845  NA 138 137 138 137 136 133*  K 3.8 3.0* 2.9* 3.5 3.8 4.2  CL 104 99* 99* 97* 103 96*  CO2 28 29  _0 GLUCOSE 151* 111* 112* 123* 103* 110*  BUN _1 CREATININE 0.81 0.72 0.73 0.71 0.68 0.79  CALCIUM 8.3* 8.0* 8.3* 8.3* 8.2* 8.6*  MG 2.0  --  1.9  --   --  2.0  PHOS 3.3  --   --   --   --   --    GFR: Estimated Creatinine Clearance: 167.4 mL/min (by C-G formula based on SCr of 0.79 mg/dL).  Liver Function Tests: Recent Labs  Lab 11/01/17 0610  AST 27  ALT 20  ALKPHOS 66  BILITOT 0.6  PROT 5.4*  ALBUMIN 1.7*    CBG: Recent Labs  Lab 11/02/17 1502 11/02/17 2245 11/03/17 0821 11/03/17 1157 11/03/17 1650  GLUCAP 104* 113* 122* 95 102*    Recent Results (from the past 240 hour(s))  Blood  Culture (routine x 2)     Status: None   Collection Time: 10/28/17 12:45 AM  Result Value Ref Range Status   Specimen Description BLOOD LEFT ANTECUBITAL  Final   Special Requests BOTTLES DRAWN AEROBIC AND ANAEROBIC BCAV  Final   Culture   Final    NO GROWTH 5 DAYS Performed at [facility], [address], [place], [place] [address]    Report Status 11/02/2017 FINAL  Final  Blood Culture (routine x 2)     Status: None   Collection Time: 10/28/17  1:50 AM  Result Value Ref Range Status   Specimen Description BLOOD RIGHT WRIST ARTERIAL  Final   Special Requests IN PEDIATRIC BOTTLE Blood Culture adequate volume  Final   Culture   Final    NO GROWTH 5 DAYS Performed at [facility], [place] [address], [place], [place] [address]    Report Status 11/02/2017 FINAL  Final  MRSA PCR Screening     Status: None   Collection Time: 10/28/17  4:43 AM  Result Value Ref Range Status   MRSA by PCR NEGATIVE NEGATIVE Final    Comment:        The GeneXpert MRSA Assay (FDA approved for NASAL specimens only), is one component of a comprehensive MRSA colonization surveillance program. It is not intended to diagnose MRSA infection nor to guide or monitor treatment for MRSA infections.   Respiratory Panel by PCR     Status: None   Collection Time: 10/28/17  8:09 AM  Result Value Ref Range Status   Adenovirus NOT DETECTED NOT DETECTED Final   Coronavirus 229E NOT DETECTED NOT DETECTED Final   Coronavirus HKU1 NOT DETECTED NOT DETECTED Final   Coronavirus NL63 NOT DETECTED NOT DETECTED Final   Coronavirus OC43 NOT DETECTED NOT DETECTED Final   Metapneumovirus NOT DETECTED NOT DETECTED Final   Rhinovirus / Enterovirus NOT DETECTED NOT DETECTED Final   Influenza A NOT DETECTED NOT DETECTED Final   Influenza B NOT DETECTED NOT DETECTED Final   Parainfluenza Virus 1 NOT DETECTED NOT DETECTED Final   Parainfluenza Virus 2 NOT DETECTED NOT DETECTED Final   Parainfluenza Virus 3 NOT DETECTED  NOT DETECTED Final   Parainfluenza Virus 4 NOT DETECTED NOT DETECTED Final   Respiratory Syncytial Virus NOT DETECTED NOT DETECTED Final   Bordetella pertussis NOT DETECTED NOT DETECTED Final   Chlamydophila pneumoniae NOT DETECTED NOT DETECTED Final   Mycoplasma pneumoniae NOT DETECTED NOT DETECTED Final  Culture, sputum-assessment     Status: None   Collection Time: 10/28/17  1:09 PM  Result Value Ref Range  Status   Specimen Description SPUTUM  Final   Special Requests NONE  Final   Sputum evaluation   Final    Sputum specimen not acceptable for testing.  Please recollect.   Gram Stain Report Called to,Read Back By and Verified With: RN Nichola Sizer [phone] MLM    Report Status 10/28/2017 FINAL  Final  Culture, body fluid-bottle     Status: None   Collection Time: 10/28/17  4:01 PM  Result Value Ref Range Status   Specimen Description FLUID LEFT PLEURAL  Final   Special Requests BOTTLES DRAWN AEROBIC AND ANAEROBIC  Final   Culture NO GROWTH 5 DAYS  Final   Report Status 11/02/2017 FINAL  Final  Gram stain     Status: None   Collection Time: 10/28/17  4:01 PM  Result Value Ref Range Status   Specimen Description FLUID LEFT PLEURAL  Final   Special Requests NONE  Final   Gram Stain   Final    ABUNDANT WBC PRESENT,BOTH PMN AND MONONUCLEAR NO ORGANISMS SEEN    Report Status 10/28/2017 FINAL  Final  Body fluid culture     Status: None   Collection Time: 10/28/17  7:21 PM  Result Value Ref Range Status   Specimen Description PLEURAL  Final   Special Requests FLUID  Final   Gram Stain   Final    ABUNDANT WBC PRESENT,BOTH PMN AND MONONUCLEAR NO ORGANISMS SEEN    Culture   Final    FEW VIRIDANS STREPTOCOCCUS CRITICAL RESULT CALLED TO, READ BACK BY AND VERIFIED WITH: S. TUTTLE RN, AT 7425 10/31/17 BY D. VANHOOK REGARDING CULTURE GROWTH    Report Status 11/02/2017 FINAL  Final   Organism ID, Bacteria VIRIDANS STREPTOCOCCUS  Final      Susceptibility   Viridans streptococcus  - MIC*    PENICILLIN <=0.06 SENSITIVE Sensitive     CEFTRIAXONE <=0.12 SENSITIVE Sensitive     ERYTHROMYCIN <=0.12 SENSITIVE Sensitive     LEVOFLOXACIN 0.5 SENSITIVE Sensitive     VANCOMYCIN 0.5 SENSITIVE Sensitive     * FEW VIRIDANS STREPTOCOCCUS  Culture, bal-quantitative     Status: Abnormal   Collection Time: 10/29/17 11:15 AM  Result Value Ref Range Status   Specimen Description BRONCHIAL ALVEOLAR LAVAGE  Final   Special Requests ADDED TO SAMPLE ALREADY IN LAB  Final   Gram Stain   Final    ABUNDANT WBC PRESENT, PREDOMINANTLY PMN RARE SQUAMOUS EPITHELIAL CELLS PRESENT NO ORGANISMS SEEN    Culture (A)  Final    5,000 COLONIES/mL Consistent with normal respiratory flora.   Report Status 11/01/2017 FINAL  Final  Body fluid culture     Status: None   Collection Time: 10/30/17  8:29 AM  Result Value Ref Range Status   Specimen Description FLUID PLEURAL LEFT  Final   Special Requests SPEC A ON SWABS POF VANC AND ZOSYN  Final   Gram Stain   Final    MODERATE WBC PRESENT, PREDOMINANTLY PMN NO ORGANISMS SEEN RESULT CALLED TO, READ BACK BY AND VERIFIED WITH: WAGONER RN AT 9563 ON [id number] BY SJW    Culture   Final    RARE VIRIDANS STREPTOCOCCUS CRITICAL RESULT CALLED TO, READ BACK BY AND VERIFIED WITH: S. TUTTLE RN, AT 3295 10/31/17 BY D. VANHOOK REGARDING CULTURE GROWTH SUSCEPTIBILITIES PERFORMED ON PREVIOUS CULTURE WITHIN THE LAST 5 DAYS.    Report Status 11/02/2017 FINAL  Final  Fungus Culture With Stain     Status: None (Preliminary result)  Collection Time: 10/30/17  8:29 AM  Result Value Ref Range Status   Fungus Stain Final report  Final    Comment: (NOTE) Performed At: [facility] [place], [place] [id number] Rush Farmer MD KT:[id number]    Fungus (Mycology) Culture PENDING  Incomplete   Fungal Source FLUID  Final    Comment: LEFT PLEURAL   Acid Fast Smear (AFB)     Status: None   Collection Time: 10/30/17  8:29 AM  Result Value Ref  Range Status   AFB Specimen Processing Concentration  Final   Acid Fast Smear Negative  Final    Comment: (NOTE) Performed At: [facility] [place], [place] [id number] Rush Farmer MD BW:[id number]    Source (AFB) FLUID  Final    Comment: LEFT PLEURAL   Fungus Culture Result     Status: None   Collection Time: 10/30/17  8:29 AM  Result Value Ref Range Status   Result 1 Comment  Final    Comment: (NOTE) KOH/Calcofluor preparation:  no fungus observed. Performed At: [facility] [place], [place] [id number] Rush Farmer MD OE:[id number]   Fungus Culture With Stain     Status: None (Preliminary result)   Collection Time: 10/30/17  8:36 AM  Result Value Ref Range Status   Fungus Stain Final report  Final    Comment: (NOTE) Performed At: [facility] [place], [place] [id number] Rush Farmer MD VQ:[id number]    Fungus (Mycology) Culture PENDING  Incomplete   Fungal Source TISSUE  Final    Comment: LEFT LUNG   Aerobic/Anaerobic Culture (surgical/deep wound)     Status: None   Collection Time: 10/30/17  8:36 AM  Result Value Ref Range Status   Specimen Description TISSUE LEFT LUNG PLEURAL PEEL  Final   Special Requests POF VANC AND ZOSYN  Final   Gram Stain   Final    MODERATE WBC PRESENT, PREDOMINANTLY PMN NO ORGANISMS SEEN    Culture No growth aerobically or anaerobically.  Final   Report Status 11/04/2017 FINAL  Final  Acid Fast Smear (AFB)     Status: None   Collection Time: 10/30/17  8:36 AM  Result Value Ref Range Status   AFB Specimen Processing Comment  Final    Comment: Tissue Grinding and Digestion/Decontamination   Acid Fast Smear Negative  Final    Comment: (NOTE) Performed At: [facility] [place], [place] [id number] Rush Farmer MD TA:[id number]    Source (AFB) TISSUE  Final    Comment: LEFT LUNG   Fungus Culture Result     Status: None   Collection  Time: 10/30/17  8:36 AM  Result Value Ref Range Status   Result 1 Comment  Final    Comment: (NOTE) KOH/Calcofluor preparation:  no fungus observed. Performed At: [facility] [place], [place] [id number] Rush Farmer MD BE:[id number]      Scheduled Meds: . amLODipine  5 mg Oral Daily  . atorvastatin  20 mg Oral Daily  . bisacodyl  10 mg Oral Daily  . citalopram  20 mg Oral Daily  . sennosides  5 mL Per Tube QHS   And  . docusate  50 mg Per Tube QHS  . enoxaparin (LOVENOX) injection  40 mg Subcutaneous Q24H  . famotidine  20 mg Oral BID  . feeding supplement (ENSURE ENLIVE)  237 mL Oral BID BM  . nicotine  21 mg Transdermal Daily  LOS: 9 days   Cherene Altes, MD [facility] Office  [phone] Pager - Text Page per Amion as per below:  On-Call/Text Page:      [email or web address]      password [email or web address]  If 7PM-7AM, please contact night-coverage [email or web address] Password [email or web address] 11/06/2017, 10:09 AM

## 2017-11-07 ENCOUNTER — Encounter (HOSPITAL_COMMUNITY): Payer: Self-pay | Admitting: Radiology

## 2017-11-07 ENCOUNTER — Inpatient Hospital Stay (HOSPITAL_COMMUNITY): Payer: Medicaid Other

## 2017-11-07 MED ORDER — DIAZEPAM 2 MG PO TABS: 2.0000 mg | ORAL_TABLET | Freq: Two times a day (BID) | ORAL | Status: DC

## 2017-11-07 MED ORDER — IOPAMIDOL (ISOVUE-370) INJECTION 76%
INTRAVENOUS | Status: AC
  Administered 2017-11-07: 100 mL
  Filled 2017-11-07: qty 100
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
## 2017-11-07 NOTE — Progress Notes (Signed)
Patient ID: Elijah Gillespie, male   DOB: 10/04/1971, 47 y.o.   MRN: [id number]      [address]       [place] [address]             [phone]                 8 Days Post-Op Procedure(s) (LRB): VIDEO BRONCHOSCOPY (N/A) LEFT VIDEO ASSISTED THORACOSCOPY (VATS)/ MINI THORACOTOMY/ DECORTICATION (Left)  LOS: 10 days   Subjective: Patient on 4e now, feels better   Objective: Vital signs in last 24 hours: Patient Vitals for the past 24 hrs:  BP Temp Temp src Pulse Resp SpO2  11/07/17 0933 116/74 - - - - -  11/07/17 0129 119/86 99.3 F (37.4 C) Oral - 18 91 %  11/06/17 2319 128/76 98.9 F (37.2 C) Oral (!) 118 (!) 27 90 %  11/06/17 1922 134/75 99.3 F (37.4 C) Oral (!) 117 (!) 28 90 %  11/06/17 1537 - 98.9 F (37.2 C) Oral - - 98 %  11/06/17 1113 121/70 98.6 F (37 C) Oral (!) 112 (!) 24 98 %    Filed Weights   10/30/17 1030 10/30/17 1112 10/31/17 0500  Weight: 280 lb 6.8 oz (127.2 kg) 280 lb 6.8 oz (127.2 kg) 285 lb 15 oz (129.7 kg)    Hemodynamic parameters for last 24 hours:    Intake/Output from previous day: 01/04 0701 - 01/05 0700 In: 250 [P.O.:150; IV Piggyback:100] Out: 300 [Urine:300] Intake/Output this shift: No intake/output data recorded.  Scheduled Meds: . amLODipine  5 mg Oral Daily  . atorvastatin  20 mg Oral Daily  . bisacodyl  10 mg Oral Daily  . citalopram  20 mg Oral Daily  . sennosides  5 mL Per Tube QHS   And  . docusate  50 mg Per Tube QHS  . enoxaparin (LOVENOX) injection  40 mg Subcutaneous Q24H  . famotidine  20 mg Oral BID  . feeding supplement (ENSURE ENLIVE)  237 mL Oral BID BM  . nicotine  21 mg Transdermal Daily   Continuous Infusions: . ampicillin-sulbactam (UNASYN) IV Stopped (11/07/17 0102)  . methocarbamol (ROBAXIN)  IV Stopped (11/05/17 1959)   PRN Meds:.albuterol, fentaNYL (SUBLIMAZE) injection, lidocaine (PF), methocarbamol (ROBAXIN)  IV, oxyCODONE, traMADol  General appearance: alert and  cooperative Neurologic: intact Heart: regular rate and rhythm, S1, S2 normal, no murmur, click, rub or gallop Lungs: diminished breath sounds LLL Abdomen: soft, non-tender; bowel sounds normal; no masses,  no organomegaly Extremities: extremities normal, atraumatic, no cyanosis or edema and Homans sign is negative, no sign of DVT Wound: inatct  Lab Results: CBC: Recent Labs    11/05/17 0845  WBC 15.0*  HGB 11.1*  HCT 35.3*  PLT 377   BMET:  Recent Labs    11/05/17 0845  NA 133*  K 4.2  CL 96*  CO2 27  GLUCOSE 110*  BUN 6  CREATININE 0.79  CALCIUM 8.6*    PT/INR: No results for input(s): LABPROT, INR in the last 72 hours.   Radiology Dg Chest 2 View  Result Date: 11/05/2017 CLINICAL DATA:  Chest injury.  Recent chest tube removal. EXAM: CHEST  2 VIEW COMPARISON:  11/04/2017 . FINDINGS: Interim removal of left chest tube. No pneumothorax. Mediastinum and hilar structures are stable. Heart size stable. Stable left lung atelectatic change. Mild left lung infiltrate cannot be excluded. Stable left-sided pleural thickening and elevation of left hemidiaphragm. No pneumothorax. IMPRESSION: Interim removal of left  chest tube. No pneumothorax. Stable atelectatic changes in the left lung. Mild left lung infiltrate cannot be excluded. Stable left-sided pleural thickening and elevation left hemidiaphragm. Electronically Signed   By: Maisie Fushomas  Register   On: 11/05/2017 10:40     Assessment/Plan: S/P Procedure(s) (LRB): VIDEO BRONCHOSCOPY (N/A) LEFT VIDEO ASSISTED THORACOSCOPY (VATS)/ MINI THORACOTOMY/ DECORTICATION (Left) Mobilize improving  Follow up chest xray in am, home soon per [facility], poss tomorrow  I have seen and examined Elijah Gillespie and agree with the above assessment  and plan. Will need follow up in surgery office - arranged , also will need medical follow up primary car or pulmonary who saw in hospital - not arranged   Delight OvensEdward B Kamiya Acord MD Bee[facility] [phone] Office  [phone] 11/07/2017 9:51 AM    Delight OvensEdward B Keerthana Vanrossum MD 11/07/2017 9:49 AM

## 2017-11-08 ENCOUNTER — Inpatient Hospital Stay (HOSPITAL_COMMUNITY): Payer: Medicaid Other

## 2017-11-08 LAB — CBC
HCT: 32.9 % — ABNORMAL LOW (ref 39.0–52.0)
Hemoglobin: 9.9 g/dL — ABNORMAL LOW (ref 13.0–17.0)
MCH: 25.2 pg — ABNORMAL LOW (ref 26.0–34.0)
MCHC: 30.1 g/dL (ref 30.0–36.0)
MCV: 83.7 fL (ref 78.0–100.0)
Platelets: 387 10*3/uL (ref 150–400)
RBC: 3.93 MIL/uL — ABNORMAL LOW (ref 4.22–5.81)
RDW: 15.8 % — ABNORMAL HIGH (ref 11.5–15.5)
WBC: 16.8 10*3/uL — ABNORMAL HIGH (ref 4.0–10.5)

## 2017-11-08 LAB — BASIC METABOLIC PANEL
Anion gap: 7 (ref 5–15)
BUN: 9 mg/dL (ref 6–20)
CO2: 28 mmol/L (ref 22–32)
Calcium: 8.7 mg/dL — ABNORMAL LOW (ref 8.9–10.3)
Chloride: 95 mmol/L — ABNORMAL LOW (ref 101–111)
Creatinine, Ser: 0.86 mg/dL (ref 0.61–1.24)
GFR calc Af Amer: >60 mL/min (ref >60)
GFR calc non Af Amer: >60 mL/min (ref >60)
Glucose, Bld: 114 mg/dL — ABNORMAL HIGH (ref 65–99)
Potassium: 4.2 mmol/L (ref 3.5–5.1)
Sodium: 130 mmol/L — ABNORMAL LOW (ref 135–145)

## 2017-11-08 MED ORDER — OXYCODONE HCL 5 MG PO TABS
5.0000 mg | ORAL_TABLET | ORAL | Status: DC | PRN
  Administered 2017-11-08 – 2017-11-09 (×7): 10 mg via ORAL
  Filled 2017-11-08 (×7): qty 2

## 2017-11-08 MED ORDER — LEVALBUTEROL HCL 0.63 MG/3ML IN NEBU: 0.6300 mg | INHALATION_SOLUTION | RESPIRATORY_TRACT | Status: DC | PRN

## 2017-11-08 MED ORDER — ACETAMINOPHEN 500 MG PO TABS: 500.0000 mg | ORAL_TABLET | Freq: Four times a day (QID) | ORAL | Status: DC | PRN

## 2017-11-08 MED ORDER — FENTANYL CITRATE (PF) 100 MCG/2ML IJ SOLN
50.0000 ug | INTRAMUSCULAR | Status: DC | PRN
  Administered 2017-11-08 (×3): 50 ug via INTRAVENOUS
  Administered 2017-11-09 (×4): 75 ug via INTRAVENOUS
  Filled 2017-11-08 (×6): qty 2

## 2017-11-08 MED ORDER — METHOCARBAMOL 500 MG PO TABS: 750.0000 mg | ORAL_TABLET | Freq: Four times a day (QID) | ORAL | Status: DC | PRN

## 2017-11-08 MED ORDER — TRAMADOL HCL 50 MG PO TABS: 50.0000 mg | ORAL_TABLET | Freq: Four times a day (QID) | ORAL | Status: DC | PRN

## 2017-11-08 NOTE — Progress Notes (Signed)
Center TEAM 1 - Stepdown/ICU TEAM  Elijah Gillespie  UEA:[id number] DOB: Jun 29, 1971 DOA: 10/27/2017 PCP: Patria Mane, MD    Brief Narrative:  47 y/o male admitted with empyema on 12/26 requiring VATS on 12/28.  Significant Events: 12/26 admit - thoracentesis > chest tube placement per TCTS 12/27 bronchoscopy 12/28 VATS - remained on vent postop 12/29 extubated   Subjective: Remains in sinus tachycardia.  Reports that his pain meds are wearing off too soon and that he is experiencing nearly constant pain.  Denies sob.    Assessment & Plan:  Sepsis due to Strep viridans Empyema - LLL CAP narrowed to ampicillin-sulbactam - chest tubes now all removed per Thoracic Surgery - clinically improving - cont IV abx for another day as suggested by TCTS  Acute respiratory failure with hypoxemia Due to above - resolved w/ pt now on RA   Sinus Tachycardia On no chronic BB - is on chronic benzo which has not been continued, but this is a bit far out for withdrawal, and mental status is normal - likely due to pain and stress of acute illness - CTa chest w/o evidence of PE   Hypertension  BP controlled   Hypokalemia  Corrected   HLD Cont Lipitor   Thyroid nodule  CT angiogram incidentally showed 5.2 cm right thyroid nodule - TSH normal - free T4 mildly elevated - free T3 essentially normal - thyroid US noted one R mid thyroid nodule which meets criteria for bx - pt desires bx now if possible - FNA ordered but can be done as outpt if pt able to be d/c home 11/08/17   Anemia without bleeding Hgb stable/improving - most c/w anemia due to acute critical illness    Hyperglycemia No hx of DM - A1c 6.0 therefore not yet c/w DM - due to acute infection  Tobacco abuse  counseled on need to d/c tobacco abuse  Obesity - Body mass index is 35.74 kg/m.  DVT prophylaxis: lovenox Code Status: FULL CODE Family Communication: no family present at time of exam  Disposition Plan: cont IV abx -  ambulate - anticipate d/c home 11/09/17  Consultants:  PCCM TCTS  Antimicrobials:  Azithromycin 12/25>12/27 Rocephin 12/25>12/27 Vancomycin 12/27> 12/30 Zosyn 12/27> 12/30 Amp/Sulbactam 12/30 >  Objective: Gillespie pressure 129/70, pulse (!) 109, temperature 99 F (37.2 C), temperature source Oral, resp. rate (!) 23, height 6\' 3"  (1.905 m), weight 129.7 kg (285 lb 15 oz), SpO2 100 %.  Intake/Output Summary (Last 24 hours) at 11/08/2017 1433 Last data filed at 11/08/2017 1300 Gross per 24 hour  Intake 2440 ml  Output -  Net 2440 ml   Filed Weights   10/30/17 1030 10/30/17 1112 10/31/17 0500  Weight: 127.2 kg (280 lb 6.8 oz) 127.2 kg (280 lb 6.8 oz) 129.7 kg (285 lb 15 oz)    Examination: General: No acute respiratory distress Lungs: poor air movement L base - no wheezing  Cardiovascular: tachycardic but regular - no M or rub  Extremities: No edema B LE   CBC: Recent Labs  Lab 11/02/17 0658 11/03/17 0151 11/04/17 0238 11/05/17 0845 11/08/17 0243  WBC 16.0* 18.0* 13.6* 15.0* 16.8*  NEUTROABS 12.6*  --   --  11.1*  --   HGB 10.8* 10.6* 10.0* 11.1* 9.9*  HCT 33.9* 33.7* 32.8* 35.3* 32.9*  MCV 84.5 85.1 85.6 84.4 83.7  PLT 393 417* 376 377 387   Basic Metabolic Panel: Recent Labs  Lab 11/02/17 0658 11/03/17 0151 11/04/17 0238 11/05/17  0845 11/08/17 0243  NA 138 137 136 133* 130*  K 2.9* 3.5 3.8 4.2 4.2  CL 99* 97* 103 96* 95*  CO2 29 31 27 27 28   GLUCOSE 112* 123* 103* 110* 114*  BUN 9 8 6 6 9   CREATININE 0.73 0.71 0.68 0.79 0.86  CALCIUM 8.3* 8.3* 8.2* 8.6* 8.7*  MG 1.9  --   --  2.0  --    GFR: Estimated Creatinine Clearance: 155.8 mL/min (by C-G formula based on SCr of 0.86 mg/dL).  Liver Function Tests: No results for input(s): AST, ALT, ALKPHOS, BILITOT, PROT, ALBUMIN in the last 168 hours.  CBG: Recent Labs  Lab 11/02/17 1502 11/02/17 2245 11/03/17 0821 11/03/17 1157 11/03/17 1650  GLUCAP 104* 113* 122* 95 102*    Recent Results (from the  past 240 hour(s))  Body fluid culture     Status: None   Collection Time: 10/30/17  8:29 AM  Result Value Ref Range Status   Specimen Description FLUID PLEURAL LEFT  Final   Special Requests SPEC A ON SWABS POF VANC AND ZOSYN  Final   Gram Stain   Final    MODERATE WBC PRESENT, PREDOMINANTLY PMN NO ORGANISMS SEEN RESULT CALLED TO, READ BACK BY AND VERIFIED WITH: WAGONER RN AT [id number] ON [id number] BY SJW    Culture   Final    RARE VIRIDANS STREPTOCOCCUS CRITICAL RESULT CALLED TO, READ BACK BY AND VERIFIED WITH: S. TUTTLE RN, AT 1047 10/31/17 BY D. VANHOOK REGARDING CULTURE GROWTH SUSCEPTIBILITIES PERFORMED ON PREVIOUS CULTURE WITHIN THE LAST 5 DAYS.    Report Status 11/02/2017 FINAL  Final  Fungus Culture With Stain     Status: None (Preliminary result)   Collection Time: 10/30/17  8:29 AM  Result Value Ref Range Status   Fungus Stain Final report  Final    Comment: (NOTE) Performed At: [facility] [address] [place], [place] [id number] Jolene SchimkeNagendra Sanjai MD WG:[id number]:[phone]    Fungus (Mycology) Culture PENDING  Incomplete   Fungal Source FLUID  Final    Comment: LEFT PLEURAL   Acid Fast Smear (AFB)     Status: None   Collection Time: 10/30/17  8:29 AM  Result Value Ref Range Status   AFB Specimen Processing Concentration  Final   Acid Fast Smear Negative  Final    Comment: (NOTE) Performed At: [facility] [address] [place], [place] [id number] Jolene SchimkeNagendra Sanjai MD MW:[id number]:[phone]    Source (AFB) FLUID  Final    Comment: LEFT PLEURAL   Fungus Culture Result     Status: None   Collection Time: 10/30/17  8:29 AM  Result Value Ref Range Status   Result 1 Comment  Final    Comment: (NOTE) KOH/Calcofluor preparation:  no fungus observed. Performed At: [facility] [address] [place], [place] [id number] Jolene SchimkeNagendra Sanjai MD HK:[id number]:[phone]   Fungus Culture With Stain     Status: None (Preliminary result)   Collection Time: 10/30/17  8:36 AM  Result  Value Ref Range Status   Fungus Stain Final report  Final    Comment: (NOTE) Performed At: [facility] [address] [place], [place] [id number] Jolene SchimkeNagendra Sanjai MD OA:[id number]:[phone]    Fungus (Mycology) Culture PENDING  Incomplete   Fungal Source TISSUE  Final    Comment: LEFT LUNG   Aerobic/Anaerobic Culture (surgical/deep wound)     Status: None   Collection Time: 10/30/17  8:36 AM  Result Value Ref Range Status   Specimen Description TISSUE LEFT  LUNG PLEURAL PEEL  Final   Special Requests POF VANC AND ZOSYN  Final   Gram Stain   Final    MODERATE WBC PRESENT, PREDOMINANTLY PMN NO ORGANISMS SEEN    Culture No growth aerobically or anaerobically.  Final   Report Status 11/04/2017 FINAL  Final  Acid Fast Smear (AFB)     Status: None   Collection Time: 10/30/17  8:36 AM  Result Value Ref Range Status   AFB Specimen Processing Comment  Final    Comment: Tissue Grinding and Digestion/Decontamination   Acid Fast Smear Negative  Final    Comment: (NOTE) Performed At: [facility] [address] [place], [place] [id number] Jolene Schimke MD WU:[id number]    Source (AFB) TISSUE  Final    Comment: LEFT LUNG   Fungus Culture Result     Status: None   Collection Time: 10/30/17  8:36 AM  Result Value Ref Range Status   Result 1 Comment  Final    Comment: (NOTE) KOH/Calcofluor preparation:  no fungus observed. Performed At: [facility] [address] [place], [place] [id number] Jolene Schimke MD VH:[id number]      Scheduled Meds: . amLODipine  5 mg Oral Daily  . atorvastatin  20 mg Oral Daily  . bisacodyl  10 mg Oral Daily  . citalopram  20 mg Oral Daily  . sennosides  5 mL Per Tube QHS   And  . docusate  50 mg Per Tube QHS  . enoxaparin (LOVENOX) injection  40 mg Subcutaneous Q24H  . famotidine  20 mg Oral BID  . feeding supplement (ENSURE ENLIVE)  237 mL Oral BID BM  . nicotine  21 mg Transdermal Daily     LOS: 11 days   Elijah Blood, MD [facility] Office  [phone] Pager - Text Page per Amion as per below:  On-Call/Text Page:      [email or web address]      password [email or web address]  If 7PM-7AM, please contact night-coverage [email or web address] Password [facility] 11/08/2017, 2:33 PM

## 2017-11-08 NOTE — Plan of Care (Signed)
  Pain Managment: General experience of comfort will improve 11/08/2017 0015 - Progressing by Margarito LinerSherrill, Pierce Barocio M, RN Pt pain consistently 6-10/10. Controlled adequately w/ PRNs. Pt calls out for them when appropriate.

## 2017-11-08 NOTE — Progress Notes (Signed)
Patient ID: Elijah FabianRichard Elijah Gillespie, male   DOB: Mar 22, 1971, 47 y.o.   MRN: [id number]      [address]       [place] [id number]             [phone]                 9 Days Post-Op Procedure(s) (LRB): VIDEO BRONCHOSCOPY (N/A) LEFT VIDEO ASSISTED THORACOSCOPY (VATS)/ MINI THORACOTOMY/ DECORTICATION (Left)  LOS: 11 days   Subjective: Overall the patient continues to improve, denies shortness of breath, ambulating not on oxygen maximum temperature yesterday 99.7  Objective: Vital signs in last 24 hours: Patient Vitals for the past 24 hrs:  BP Temp Temp src Pulse Resp SpO2  11/08/17 0900 135/80 - - - - -  11/08/17 0355 138/78 99.1 F (37.3 C) Oral (!) 117 20 91 %  11/07/17 1954 138/79 98.9 F (37.2 C) Oral (!) 117 18 93 %  11/07/17 1207 135/78 99.7 F (37.6 C) Oral (!) 116 16 93 %  11/07/17 1200 - - - (!) 121 (!) 21 92 %    Filed Weights   10/30/17 1030 10/30/17 1112 10/31/17 0500  Weight: 280 lb 6.8 oz (127.2 kg) 280 lb 6.8 oz (127.2 kg) 285 lb 15 oz (129.7 kg)    Hemodynamic parameters for last 24 hours:    Intake/Output from previous day: 01/05 0701 - 01/06 0700 In: 2440 [P.O.:2100; IV Piggyback:300] Out: -  Intake/Output this shift: Total I/O In: 360 [P.O.:360] Out: -   Scheduled Meds: . amLODipine  5 mg Oral Daily  . atorvastatin  20 mg Oral Daily  . bisacodyl  10 mg Oral Daily  . citalopram  20 mg Oral Daily  . sennosides  5 mL Per Tube QHS   And  . docusate  50 mg Per Tube QHS  . enoxaparin (LOVENOX) injection  40 mg Subcutaneous Q24H  . famotidine  20 mg Oral BID  . feeding supplement (ENSURE ENLIVE)  237 mL Oral BID BM  . nicotine  21 mg Transdermal Daily   Continuous Infusions: . ampicillin-sulbactam (UNASYN) IV 3 g (11/08/17 1003)  . methocarbamol (ROBAXIN)  IV Stopped (11/05/17 1959)   PRN Meds:.albuterol, fentaNYL (SUBLIMAZE) injection, lidocaine (PF), methocarbamol (ROBAXIN)  IV, oxyCODONE, traMADol  General appearance: alert  and cooperative Neurologic: intact Heart: regular rate and rhythm, S1, S2 normal, no murmur, click, rub or gallop Lungs: diminished breath sounds LLL Abdomen: soft, non-tender; bowel sounds normal; no masses,  no organomegaly Extremities: extremities normal, atraumatic, no cyanosis or edema and Homans sign is negative, no sign of DVT Wound: Wounds are without evidence of infection  Lab Results: CBC: Recent Labs    11/08/17 0243  WBC 16.8*  HGB 9.9*  HCT 32.9*  PLT 387   BMET:  Recent Labs    11/08/17 0243  NA 130*  K 4.2  CL 95*  CO2 28  GLUCOSE 114*  BUN 9  CREATININE 0.86  CALCIUM 8.7*    PT/INR: No results for input(s): LABPROT, INR in the last 72 hours.   Radiology Dg Chest 2 View  Result Date: 11/08/2017 CLINICAL DATA:  Status post bronchoscopy and chest tube EXAM: CHEST  2 VIEW COMPARISON:  CTA chest dated 11/07/2017 FINDINGS: Increased interstitial markings in the left upper lobe. Left lower lobe opacity, better evaluated on CT chest. Small left pleural effusion. Right lung is clear.  No pneumothorax. The heart is normal in size. IMPRESSION: Left lung opacities, better evaluated  on recent CT chest. Small left pleural effusion. No pneumothorax. Electronically Signed   By: Charline Bills M.D.   On: 11/08/2017 09:08   Ct Angio Chest Pe W Or Wo Contrast  Result Date: 11/07/2017 CLINICAL DATA:  Chest pain and shortness of breath. Chest tubes recently removed. Positive D-dimer. EXAM: CT ANGIOGRAPHY CHEST WITH CONTRAST TECHNIQUE: Multidetector CT imaging of the chest was performed using the standard protocol during bolus administration of intravenous contrast. Multiplanar CT image reconstructions and MIPs were obtained to evaluate the vascular anatomy. CONTRAST:  ISOVUE-370 IOPAMIDOL (ISOVUE-370) INJECTION 76% COMPARISON:  10/28/2017 FINDINGS: Cardiovascular: Moderately good opacification of the central and segmental pulmonary arteries. No focal filling defects  demonstrated. No evidence of significant pulmonary embolus. Normal heart size. No pericardial effusion. Normal caliber thoracic aorta. No evidence of aortic dissection. Great vessel origins are patent. Mediastinum/Nodes: Scattered mediastinal lymph nodes are moderately prominent without pathologic enlargement, likely reactive. Diffuse heterogeneous enlargement of the right lobe of the thyroid gland likely representing goiter. No change since prior study. Esophagus is decompressed. Lungs/Pleura: Small to moderate left pleural effusion with multiple gas bubbles. Gas in the effusion could relate from previous catheter insertion or may indicate infection/empyema. Consolidation in the left lower lung likely representing pneumonia or atelectasis. Follow-up after resolution of acute process is recommended to exclude underlying neoplasm which remains a possibility. Airways are patent. Right lung is clear. Upper Abdomen: No acute process demonstrated in the visualized upper abdominal organs. Musculoskeletal: Soft tissue infiltration and subcutaneous gas in the left chest wall with small left chest wall hematoma. This likely represents the previous catheter insertion site. Multiple bilateral old rib fractures. Subacute left lower eleventh rib fracture. Review of the MIP images confirms the above findings. IMPRESSION: 1. No evidence of significant pulmonary embolus. 2. Probable reactive lymph nodes in the mediastinum. 3. Heterogeneous enlargement of the right thyroid likely represents goiter. 4. Moderate left pleural effusion with multiple gas bubbles. This could relate to previous catheter insertion or may indicate infection/empyema. Left lower lung consolidation may represent pneumonia or atelectasis. Follow-up after resolution of acute process is recommended to exclude underlying neoplasm which remains a possibility. 5. Soft tissue infiltration, subcutaneous gas, and small hematoma in the left chest wall likely representing  a previous catheter insertion site. 6. Old bilateral rib fractures. Electronically Signed   By: Burman Nieves M.D.   On: 11/07/2017 18:32   I have independently reviewed the above radiology studies  and reviewed the findings with the patient.-CT appears as would be expected this soon after surgery   Assessment/Plan: S/P Procedure(s) (LRB): VIDEO BRONCHOSCOPY (N/A) LEFT VIDEO ASSISTED THORACOSCOPY (VATS)/ MINI THORACOTOMY/ DECORTICATION (Left) Mobilize Diabetes control Continue IV antibiotics, possible discharge in another day We will need to continue on appropriate p.o. antibiotics for 10-14 days after discharge Will need follow up in surgery office - arranged , also will need medical follow up primary care and/or pulmonary who saw in hospital - not arranged    [name] MD 11/08/2017 10:33 AM

## 2017-11-09 LAB — CBC
HCT: 31 % — ABNORMAL LOW (ref 39.0–52.0)
HEMOGLOBIN: 9.4 g/dL — AB (ref 13.0–17.0)
MCH: 25.3 pg — AB (ref 26.0–34.0)
MCHC: 30.3 g/dL (ref 30.0–36.0)
MCV: 83.3 fL (ref 78.0–100.0)
PLATELETS: 402 10*3/uL — AB (ref 150–400)
RBC: 3.72 MIL/uL — AB (ref 4.22–5.81)
RDW: 15.9 % — ABNORMAL HIGH (ref 11.5–15.5)
WBC: 15.5 10*3/uL — ABNORMAL HIGH (ref 4.0–10.5)

## 2017-11-09 MED ORDER — AMOXICILLIN-POT CLAVULANATE 875-125 MG PO TABS: 1.0000 | ORAL_TABLET | Freq: Two times a day (BID) | ORAL | 0 refills | Status: DC

## 2017-11-09 MED ORDER — SENNA 8.6 MG PO TABS: 1.0000 | ORAL_TABLET | Freq: Every day | ORAL | 0 refills | Status: DC

## 2017-11-09 MED ORDER — AMLODIPINE BESYLATE 5 MG PO TABS: 5.0000 mg | ORAL_TABLET | Freq: Every day | ORAL | 0 refills | Status: DC

## 2017-11-09 MED ORDER — METHOCARBAMOL 750 MG PO TABS: 750.0000 mg | ORAL_TABLET | Freq: Four times a day (QID) | ORAL | 0 refills | Status: DC | PRN

## 2017-11-09 MED ORDER — AMOXICILLIN-POT CLAVULANATE 875-125 MG PO TABS
1.0000 | ORAL_TABLET | Freq: Two times a day (BID) | ORAL | Status: DC
  Administered 2017-11-09: 1 via ORAL
  Filled 2017-11-09: qty 1

## 2017-11-09 MED ORDER — ACETAMINOPHEN 500 MG PO TABS: 500.0000 mg | ORAL_TABLET | Freq: Four times a day (QID) | ORAL | 0 refills | Status: DC | PRN

## 2017-11-09 MED ORDER — OXYCODONE HCL 5 MG PO TABS: 5.0000 mg | ORAL_TABLET | ORAL | 0 refills | Status: DC | PRN

## 2017-11-09 MED ORDER — AMOXICILLIN-POT CLAVULANATE 875-125 MG PO TABS: 1.0000 | ORAL_TABLET | Freq: Two times a day (BID) | ORAL | Status: DC

## 2017-11-09 MED ORDER — TRAMADOL HCL 50 MG PO TABS: 50.0000 mg | ORAL_TABLET | Freq: Four times a day (QID) | ORAL | 0 refills | Status: DC | PRN

## 2017-11-09 NOTE — Discharge Summary (Signed)
DISCHARGE SUMMARY  Devarius Nelles  MR#: [id number]  DOB:1971-05-25  Date of Admission: 10/27/2017 Date of Discharge: 11/09/2017  Attending Physician:Latania Bascomb Silvestre Gunner, MD  Patient's WUJ:[id number], Kathlene November, MD  Consults: PCCM TCTS  Disposition: D/C home   Follow-up Appts: Follow-up Information    [name], MD. Go on 12/03/2017.   Specialty:  Cardiothoracic Surgery Why:  Appointment is at 4:00, please get CXR at 3:30 at [facility] imaging located on first floor of our office building Contact information: [address] [place] [address] [phone]        [facility] Follow up on 11/13/2017.   Specialty:  Cardiothoracic Surgery Why:  Appointment is at 11:30 for suture removal Contact information: [address] [place], [address] [place] [address] [phone]       Patria Mane, MD. Call in 1 day(s).   Specialty:  Internal Medicine Contact information: [address] [facility] [place] [address] [phone]           Tests Needing Follow-up: -assessment of respiratory status and repeat imaging of prior empyema  -FNA of thyroid nodule is required (see details below) -assess BP control - pt newly started on Norvasc  Discharge Diagnoses: Sepsis due to Strep viridans Empyema - LLL CAP Acute respiratory failure with hypoxemia Sinus Tachycardia Hypertension  Hypokalemia  HLD Thyroid nodule  Anemia without bleeding Hyperglycemia Tobacco abuse  Obesity - Body mass index is 35.74 kg/m.  Initial presentation: 47 y/o male admitted with empyema on 12/26 requiring VATS on 12/28.  Hospital Course: 12/26 admit - thoracentesis > chest tube placement per TCTS 12/27 bronchoscopy 12/28 VATS - remained on vent postop 12/29 extubated   Sepsis due to Strep viridans Empyema - LLL CAP narrowed to ampicillin-sulbactam - chest tubes/drains all  removed per Thoracic Surgery - clinically improving - to complete 14 additional days of oral abx tx following d/c   Acute respiratory failure with hypoxemia Due to above - resolved w/ pt on RA at time of d/c both at rest and w/ exertion  Sinus Tachycardia On no chronic BB - is on chronic benzo which was not been continued as inpatient, but there was no other evidence of withdrawal, and mental status was normal - likely due to pain and stress of acute illness - CTa chest w/o evidence of PE   Hypertension  BP controlled   Hypokalemia  Corrected   HLD Cont Lipitor as prior to admit   Thyroid nodule  CT angiogramincidentally showed5.2 cm right thyroid nodule - TSH normal - free T4 mildly elevated - free T3 essentially normal - thyroid US noted one R mid thyroid nodule which meets criteria for bx - FNA ordered but is not done on inpatients - pt discussed w/ Rads who advised him on how to arrange via his PCP as an outpt   Anemia without bleeding Hgb stable - most c/w anemia due to acute critical illness    Hyperglycemia No hx of DM - A1c 6.0 therefore not yet c/w DM - due to acute infection  Tobacco abuse  counseled on need to d/c tobacco abuse  Obesity - Body mass index is 35.74 kg/m.  Allergies as of 11/09/2017   No Known Allergies     Medication List    STOP taking these medications   diazepam 5 MG tablet Commonly known as:  VALIUM   naproxen 500 MG tablet Commonly known as:  NAPROSYN  oxyCODONE-acetaminophen 5-325 MG tablet Commonly known as:  PERCOCET/ROXICET     TAKE these medications   acetaminophen 500 MG tablet Commonly known as:  TYLENOL Take 1 tablet (500 mg total) by mouth every 6 (six) hours as needed for mild pain, fever or headache.   amLODipine 5 MG tablet Commonly known as:  NORVASC Take 1 tablet (5 mg total) by mouth daily. Start taking on:  11/10/2017   amoxicillin-clavulanate 875-125 MG tablet Commonly known as:  AUGMENTIN Take 1  tablet by mouth 2 (two) times daily.   atorvastatin 20 MG tablet Commonly known as:  LIPITOR Take 20 mg by mouth every morning.   citalopram 20 MG tablet Commonly known as:  CELEXA Take 20 mg by mouth daily.   methocarbamol 750 MG tablet Commonly known as:  ROBAXIN Take 1 tablet (750 mg total) by mouth every 6 (six) hours as needed for muscle spasms.   omeprazole 20 MG capsule Commonly known as:  PRILOSEC Take 20 mg by mouth 2 (two) times daily.   oxyCODONE 5 MG immediate release tablet Commonly known as:  Oxy IR/ROXICODONE Take 1-2 tablets (5-10 mg total) by mouth every 3 (three) hours as needed for severe pain.   senna 8.6 MG Tabs tablet Commonly known as:  SENOKOT Take 1 tablet (8.6 mg total) by mouth at bedtime.   traMADol 50 MG tablet Commonly known as:  ULTRAM Take 1-2 tablets (50-100 mg total) by mouth every 6 (six) hours as needed for moderate pain.       Day of Discharge BP 138/72 (BP Location: Left Arm)   Pulse (!) 109   Temp 98.5 F (36.9 C) (Oral)   Resp 18   Ht 6\' 3"  (1.905 m)   Wt 129.7 kg (285 lb 15 oz)   SpO2 92%   BMI 35.74 kg/m   Physical Exam: General: No acute respiratory distress Lungs: Clear to auscultation bilaterally without wheezes or crackles Cardiovascular: Tachycardic but regular - no gallup or rub  Abdomen: Nontender, nondistended, soft, bowel sounds positive, no rebound, no ascites, no appreciable mass Extremities: No significant cyanosis, clubbing, or edema bilateral lower extremities  Basic Metabolic Panel: Recent Labs  Lab 11/03/17 0151 11/04/17 0238 11/05/17 0845 11/08/17 0243  NA 137 136 133* 130*  K 3.5 3.8 4.2 4.2  CL 97* 103 96* 95*  CO2 31 27 27 28   GLUCOSE 123* 103* 110* 114*  BUN 8 6 6 9   CREATININE 0.71 0.68 0.79 0.86  CALCIUM 8.3* 8.2* 8.6* 8.7*  MG  --   --  2.0  --     CBC: Recent Labs  Lab 11/03/17 0151 11/04/17 0238 11/05/17 0845 11/08/17 0243 11/09/17 0239  WBC 18.0* 13.6* 15.0* 16.8* 15.5*    NEUTROABS  --   --  11.1*  --   --   HGB 10.6* 10.0* 11.1* 9.9* 9.4*  HCT 33.7* 32.8* 35.3* 32.9* 31.0*  MCV 85.1 85.6 84.4 83.7 83.3  PLT 417* 376 377 387 402*    CBG: Recent Labs  Lab 11/02/17 1502 11/02/17 2245 11/03/17 0821 11/03/17 1157 11/03/17 1650  GLUCAP 104* 113* 122* 95 102*    Time spent in discharge (includes decision making & examination of pt): >35 minutes  11/09/2017, 2:07 PM   Lonia BloodJeffrey T. Cailynn Bodnar, MD [facility] Office  [phone] Pager [phone]  On-Call/Text Page:      [email or web address]      password [email or web address]

## 2017-11-09 NOTE — Progress Notes (Signed)
  Request for US FNA thyroid biopsy seen.  We do not do these as inpatient.  I have discussed this with patient and he understands and is agreeable to have his PCP order after discharge from the hospital.  He understands we can do this procedure for him at [facility] outpatient clinic [facility].  Gwynneth MacleodWENDY S Pharrah Rottman PA-C 11/09/2017 9:13 AM

## 2017-11-09 NOTE — Discharge Instructions (Signed)
IT IS VERY IMPORTANT THAT  YOU TAKE YOUR ANTIBIOTIC EXACTLY AS PRESCRIBED UNTIL ALL PILLS ARE TAKEN   Thoracotomy, Care After This sheet gives you information about how to care for yourself after your procedure. Your doctor may also give you more specific instructions. If you have problems or questions, contact your doctor. Follow these instructions at home: Preventing lung infection ( pneumonia)  Take deep breaths or do breathing exercises as told by your doctor.  Cough often. Coughing is important to clear thick spit (phlegm) and open your lungs. If coughing hurts, hold a pillow against your chest or place both hands flat on top of your cut (splinting) when you cough. This may help with discomfort.  Use an incentive spirometer as told. This is a tool that measures how well you fill your lungs with each breath.  Do lung therapy (pulmonary rehabilitation) as told. Medicines  Take over-the-counter or prescription medicines only as told by your doctor.  If you have pain, take pain-relieving medicine before your pain gets very bad. This will help you breathe and cough more comfortably.  If you were prescribed an antibiotic medicine, take it as told by your doctor. Do not stop taking the antibiotic even if you start to feel better. Activity  Ask your doctor what activities are safe for you.  Do not travel by airplane for 2 weeks after your chest tube is removed, or until your doctor says that this is safe.  Do not lift anything that is heavier than 10 lb (4.5 kg), or the limit that your doctor tells you, until he or she says that it is safe.  Do not drive until your doctor approves. ? Do not drive or use heavy machinery while taking prescription pain medicine. Incision care  Follow instructions from your doctor about how to take care of your cut from surgery (incision). Make sure you: ? Wash your hands with soap and water before you change your bandage (dressing). If you cannot use  soap and water, use hand sanitizer. ? Change your bandage as told by your doctor. ? Leave stitches (sutures), skin glue, or skin tape (adhesive) strips in place. They may need to stay in place for 2 weeks or longer. If tape strips get loose and curl up, you may trim the loose edges. Do not remove tape strips completely unless your doctor says it is okay.  Keep your bandage dry.  Check your cut from surgery every day for signs of infection. Check for: ? More redness, swelling, or pain. ? More fluid or blood. ? Warmth. ? Pus or a bad smell. Bathing  Do not take baths, swim, or use a hot tub until your doctor approves. You may take showers.  After your bandage has been removed, use soap and water to gently wash your cut from surgery. Do not use anything else to clean your cut unless your doctor tells you to. Eating and drinking  Eat a healthy diet as told by your doctor. A healthy diet includes: ? Fresh fruits and vegetables. ? Whole grains. ? Low-fat (lean) proteins.  Drink enough fluid to keep your pee (urine) clear or pale yellow. General instructions  To prevent or treat trouble pooping (constipation) while you are taking prescription pain medicine, your doctor may recommend that you: ? Take over-the-counter or prescription medicines. ? Eat foods that are high in fiber. These include fresh fruits and vegetables, whole grains, and beans. ? Limit foods that are high in fat and processed sugars,  such as fried and sweet foods.  Do not use any products that contain nicotine or tobacco. These include cigarettes and e-cigarettes. If you need help quitting, ask your doctor.  Avoid secondhand smoke.  Wear compression stockings as told. These help to prevent blood clots and reduce swelling in your legs.  If you have a chest tube, care for it as told.  Keep all follow-up visits as told by your doctor. This is important. Contact a doctor if:  You have more redness, swelling, or pain  around your cut from surgery.  You have more fluid or blood coming from your cut from surgery.  Your cut from surgery feels warm to the touch.  You have pus or a bad smell coming from your cut from surgery.  You have a fever or chills.  Your heartbeat seems uneven.  You feel sick to your stomach (nauseous).  You throw up (vomit).  You have muscle aches.  You have trouble pooping (having a bowel movement). This may mean that you: ? Poop fewer times in a week than normal. ? Have a hard time pooping. ? Have poop that is dry, hard, or bigger than normal. Get help right away if:  You get a rash.  You feel light-headed.  You feel like you might pass out (faint).  You are short of breath.  You have trouble breathing.  You are confused.  You have trouble talking.  You have problems with your seeing (vision).  You are not able to move.  You lose feeling (have numbness) in your: ? Face. ? Arms. ? Legs.  You pass out.  You have a sudden, bad headache.  You feel weak.  You have chest pain.  You have pain that: ? Is very bad. ? Gets worse, even with medicine. Summary  Take deep breaths, do breathing exercises, and cough often. This helps prevent lung infection (pneumonia).  Do not drive until your doctor approves. Do not travel by airplane for 2 weeks after your chest tube is removed, or until your doctor says that this is safe.  Check your cut from surgery every day for signs of infection.  Eat a healthy diet. This includes fresh fruits and vegetables, whole grains, and low-fat (lean) proteins. This information is not intended to replace advice given to you by your health care provider. Make sure you discuss any questions you have with your health care provider. Document Released: 04/20/2012 Document Revised: 07/14/2016 Document Reviewed: 07/14/2016 Elsevier Interactive Patient Education  2017 Elsevier Inc.   Thyroid Nodule A thyroid nodule is an  isolatedgrowth of thyroid cells that forms a lump in your thyroid gland. The thyroid gland is a butterfly-shaped gland. It is found in the lower front of your neck. This gland sends chemical messengers (hormones) through your blood to all parts of your body. These hormones are important in regulating your body temperature and helping your body to use energy. Thyroid nodules are common. Most are not cancerous (are benign). You may have one nodule or several nodules. Different types of thyroid nodules include:  Nodules that grow and fill with fluid (thyroid cysts).  Nodules that produce too much thyroid hormone (hot nodules or hyperthyroid).  Nodules that produce no thyroid hormone (cold nodules or hypothyroid).  Nodules that form from cancer cells (thyroid cancers).  What are the causes? Usually, the cause of this condition is not known. What increases the risk? Factors that make this condition more likely to develop include:  Increasing age. Thyroid  nodules become more common in people who are older than 47 years of age.  Gender. ? Benign thyroid nodules are more common in women. ? Cancerous (malignant) thyroid nodules are more common in men.  A family history that includes: ? Thyroid nodules. ? Pheochromocytoma. ? Thyroid carcinoma. ? Hyperparathyroidism.  Certain kinds of thyroid diseases, such as Hashimoto thyroiditis.  Lack of iodine.  A history of head and neck radiation, such as from X-rays.  What are the signs or symptoms? It is common for this condition to cause no symptoms. If you have symptoms, they may include:  A lump in your lower neck.  Feeling a lump or tickle in your throat.  Pain in your neck, jaw, or ear.  Having trouble swallowing.  Hot nodules may cause symptoms that include:  Weight loss.  Warm, flushed skin.  Feeling hot.  Feeling nervous.  A racing heartbeat.  Cold nodules may cause symptoms that include:  Weight gain.  Dry  skin.  Brittle hair. This may also occur with hair loss.  Feeling cold.  Fatigue.  Thyroid cancer nodules may cause symptoms that include:  Hard nodules that feel stuck to the thyroid gland.  Hoarseness.  Lumps in the glands near your thyroid (lymph nodes).  How is this diagnosed? A thyroid nodule may be felt by your health care provider during a physical exam. This condition may also be diagnosed based on your symptoms. You may also have tests, including:  An ultrasound. This may be done to confirm the diagnosis.  A biopsy. This involves taking a sample from the nodule and looking at it under a microscope to see if the nodule is benign.  Blood tests to make sure that your thyroid is working properly.  Imaging tests such as MRI or CT scan may be done if: ? Your nodule is large. ? Your nodule is blocking your airway. ? Cancer is suspected.  How is this treated? Treatment depends on the cause and size of your nodule or nodules. If the nodule is benign, treatment may not be necessary. Your health care provider may monitor the nodule to see if it goes away without treatment. If the nodule continues to grow, is cancerous, or does not go away:  It may need to be drained with a needle.  It may need to be removed with surgery.  If you have surgery, part or all of your thyroid gland may need to be removed as well. Follow these instructions at home:  Pay attention to any changes in your nodule.  Take over-the-counter and prescription medicines only as told by your health care provider.  Keep all follow-up visits as told by your health care provider. This is important. Contact a health care provider if:  Your voice changes.  You have trouble swallowing.  You have pain in your neck, ear, or jaw that is getting worse.  Your nodule gets bigger.  Your nodule starts to make it harder for you to breathe. Get help right away if:  You have a sudden fever.  You feel very  weak.  Your muscles look like they are shrinking (muscle wasting).  You have mood swings.  You feel very restless.  You feel confused.  You are seeing or hearing things that other people do not see or hear (having hallucinations).  You feel suddenly nauseous or throw up.  You suddenly have diarrhea.  You have chest pain.  There is a loss of consciousness. This information is not intended to  replace advice given to you by your health care provider. Make sure you discuss any questions you have with your health care provider. Document Released: 09/12/2004 Document Revised: 06/22/2016 Document Reviewed: 01/31/2015 Elsevier Interactive Patient Education  2018 [employer].

## 2017-11-13 ENCOUNTER — Ambulatory Visit (INDEPENDENT_AMBULATORY_CARE_PROVIDER_SITE_OTHER): Payer: Self-pay

## 2017-11-13 ENCOUNTER — Telehealth: Payer: Self-pay

## 2017-11-13 ENCOUNTER — Ambulatory Visit
Admission: RE | Admit: 2017-11-13 | Discharge: 2017-11-13 | Disposition: A | Discharge status: Home / Self Care | Payer: BLUE CROSS/BLUE SHIELD | Source: Ambulatory Visit | Attending: Thoracic Surgery (Cardiothoracic Vascular Surgery) | Admitting: Thoracic Surgery (Cardiothoracic Vascular Surgery)

## 2017-11-13 ENCOUNTER — Other Ambulatory Visit: Payer: Self-pay

## 2017-11-13 DIAGNOSIS — Z4802 Encounter for removal of sutures: Secondary | ICD-10-CM

## 2017-11-13 DIAGNOSIS — R0609 Other forms of dyspnea: Principal | ICD-10-CM

## 2017-11-13 DIAGNOSIS — J90 Pleural effusion, not elsewhere classified: Secondary | ICD-10-CM

## 2017-11-13 NOTE — Telephone Encounter (Signed)
RX for Lasix 40 mg po daily x7 days and Potassium 20 meq po daily x7 days was sent [facility]. [phone]. Per Dr Tyrone SageGerhardt. Patient is aware.

## 2017-11-13 NOTE — Progress Notes (Signed)
Removed 5 sutures from Chest Tube sites. NO signs of infection and patient tolerated well. He did state that he has been short of breath since hospital discharge and it seemed to be getting worse. He notices mostly on exertion. He was sent down to [facility] for CXR. Which noted a moderate left pleural effusion. I will send the information to Dr Tyrone SageGerhardt to review and call the patient with the plan.

## 2017-11-20 ENCOUNTER — Inpatient Hospital Stay (HOSPITAL_COMMUNITY)
Admission: AD | Admit: 2017-11-20 | Discharge: 2017-11-26 | DRG: 178 | Disposition: A | Discharge status: Home / Self Care | Payer: BLUE CROSS/BLUE SHIELD | Source: Ambulatory Visit | Attending: Cardiothoracic Surgery | Admitting: Cardiothoracic Surgery

## 2017-11-20 ENCOUNTER — Other Ambulatory Visit: Payer: Self-pay | Admitting: Physician Assistant

## 2017-11-20 ENCOUNTER — Other Ambulatory Visit: Payer: Self-pay

## 2017-11-20 ENCOUNTER — Inpatient Hospital Stay (HOSPITAL_COMMUNITY): Payer: BLUE CROSS/BLUE SHIELD

## 2017-11-20 ENCOUNTER — Other Ambulatory Visit: Payer: Self-pay | Admitting: Surgical

## 2017-11-20 ENCOUNTER — Ambulatory Visit
Admission: RE | Admit: 2017-11-20 | Discharge: 2017-11-20 | Disposition: A | Discharge status: Home / Self Care | Payer: BLUE CROSS/BLUE SHIELD | Source: Ambulatory Visit | Attending: Cardiothoracic Surgery | Admitting: Cardiothoracic Surgery

## 2017-11-20 ENCOUNTER — Encounter (HOSPITAL_COMMUNITY): Payer: Self-pay | Admitting: *Deleted

## 2017-11-20 ENCOUNTER — Ambulatory Visit (INDEPENDENT_AMBULATORY_CARE_PROVIDER_SITE_OTHER): Payer: Self-pay | Admitting: Physician Assistant

## 2017-11-20 VITALS — BP 117/75 | HR 128 | Temp 98.1°F | Resp 20 | Ht 75.0 in | Wt 258.0 lb

## 2017-11-20 DIAGNOSIS — Z8672 Personal history of thrombophlebitis: Secondary | ICD-10-CM | POA: Diagnosis not present

## 2017-11-20 DIAGNOSIS — R0603 Acute respiratory distress: Secondary | ICD-10-CM

## 2017-11-20 DIAGNOSIS — G8929 Other chronic pain: Secondary | ICD-10-CM | POA: Diagnosis present

## 2017-11-20 DIAGNOSIS — J90 Pleural effusion, not elsewhere classified: Secondary | ICD-10-CM

## 2017-11-20 DIAGNOSIS — J869 Pyothorax without fistula: Secondary | ICD-10-CM | POA: Diagnosis present

## 2017-11-20 DIAGNOSIS — K219 Gastro-esophageal reflux disease without esophagitis: Secondary | ICD-10-CM | POA: Diagnosis present

## 2017-11-20 DIAGNOSIS — B954 Other streptococcus as the cause of diseases classified elsewhere: Secondary | ICD-10-CM | POA: Diagnosis not present

## 2017-11-20 DIAGNOSIS — F17210 Nicotine dependence, cigarettes, uncomplicated: Secondary | ICD-10-CM | POA: Diagnosis present

## 2017-11-20 DIAGNOSIS — R0602 Shortness of breath: Secondary | ICD-10-CM

## 2017-11-20 DIAGNOSIS — Z09 Encounter for follow-up examination after completed treatment for conditions other than malignant neoplasm: Secondary | ICD-10-CM

## 2017-11-20 DIAGNOSIS — E860 Dehydration: Secondary | ICD-10-CM | POA: Diagnosis present

## 2017-11-20 DIAGNOSIS — Z978 Presence of other specified devices: Secondary | ICD-10-CM | POA: Diagnosis not present

## 2017-11-20 DIAGNOSIS — Z803 Family history of malignant neoplasm of breast: Secondary | ICD-10-CM

## 2017-11-20 DIAGNOSIS — E041 Nontoxic single thyroid nodule: Secondary | ICD-10-CM | POA: Diagnosis present

## 2017-11-20 DIAGNOSIS — E785 Hyperlipidemia, unspecified: Secondary | ICD-10-CM | POA: Diagnosis present

## 2017-11-20 DIAGNOSIS — Z4682 Encounter for fitting and adjustment of non-vascular catheter: Secondary | ICD-10-CM

## 2017-11-20 DIAGNOSIS — Z79891 Long term (current) use of opiate analgesic: Secondary | ICD-10-CM | POA: Diagnosis not present

## 2017-11-20 DIAGNOSIS — I10 Essential (primary) hypertension: Secondary | ICD-10-CM | POA: Diagnosis present

## 2017-11-20 LAB — URINALYSIS, ROUTINE W REFLEX MICROSCOPIC
Bilirubin Urine: NEGATIVE
GLUCOSE, UA: NEGATIVE mg/dL
Hgb urine dipstick: NEGATIVE
KETONES UR: NEGATIVE mg/dL
Leukocytes, UA: NEGATIVE
Nitrite: NEGATIVE
PH: 6 (ref 5.0–8.0)
Protein, ur: 30 mg/dL — AB
SPECIFIC GRAVITY, URINE: 1.025 (ref 1.005–1.030)
Squamous Epithelial / LPF: NONE SEEN

## 2017-11-20 LAB — CBC WITH DIFFERENTIAL/PLATELET
BASOS PCT: 0 %
Basophils Absolute: 0 10*3/uL (ref 0.0–0.1)
EOS ABS: 0.4 10*3/uL (ref 0.0–0.7)
EOS PCT: 4 %
HCT: 37.5 % — ABNORMAL LOW (ref 39.0–52.0)
HEMOGLOBIN: 11.5 g/dL — AB (ref 13.0–17.0)
Lymphocytes Relative: 11 %
Lymphs Abs: 1.2 10*3/uL (ref 0.7–4.0)
MCH: 25.8 pg — ABNORMAL LOW (ref 26.0–34.0)
MCHC: 30.7 g/dL (ref 30.0–36.0)
MCV: 84.1 fL (ref 78.0–100.0)
MONOS PCT: 7 %
Monocytes Absolute: 0.8 10*3/uL (ref 0.1–1.0)
NEUTROS PCT: 78 %
Neutro Abs: 8.4 10*3/uL — ABNORMAL HIGH (ref 1.7–7.7)
PLATELETS: 384 10*3/uL (ref 150–400)
RBC: 4.46 MIL/uL (ref 4.22–5.81)
RDW: 15.8 % — ABNORMAL HIGH (ref 11.5–15.5)
WBC: 10.8 10*3/uL — ABNORMAL HIGH (ref 4.0–10.5)

## 2017-11-20 LAB — STREP PNEUMONIAE URINARY ANTIGEN: STREP PNEUMO URINARY ANTIGEN: NEGATIVE

## 2017-11-20 LAB — CREATININE, SERUM: Creatinine, Ser: 1.08 mg/dL (ref 0.61–1.24)

## 2017-11-20 MED ORDER — AMLODIPINE BESYLATE 5 MG PO TABS
5.0000 mg | ORAL_TABLET | Freq: Every day | ORAL | Status: DC
  Administered 2017-11-21 – 2017-11-26 (×6): 5 mg via ORAL
  Filled 2017-11-20 (×6): qty 1

## 2017-11-20 MED ORDER — SODIUM CHLORIDE 0.9 % IV SOLN: 250.0000 mL | INTRAVENOUS | Status: DC | PRN

## 2017-11-20 MED ORDER — PANTOPRAZOLE SODIUM 40 MG PO TBEC
40.0000 mg | DELAYED_RELEASE_TABLET | Freq: Every day | ORAL | Status: DC
  Administered 2017-11-21 – 2017-11-26 (×6): 40 mg via ORAL
  Filled 2017-11-20 (×6): qty 1

## 2017-11-20 MED ORDER — IOPAMIDOL (ISOVUE-300) INJECTION 61%
INTRAVENOUS | Status: AC
  Administered 2017-11-20: 75 mL
  Filled 2017-11-20: qty 75

## 2017-11-20 MED ORDER — PANTOPRAZOLE SODIUM 40 MG PO TBEC: 40.0000 mg | DELAYED_RELEASE_TABLET | Freq: Every day | ORAL | Status: DC

## 2017-11-20 MED ORDER — ENOXAPARIN SODIUM 40 MG/0.4ML ~~LOC~~ SOLN
40.0000 mg | SUBCUTANEOUS | Status: DC
  Administered 2017-11-20: 40 mg via SUBCUTANEOUS
  Filled 2017-11-20: qty 0.4

## 2017-11-20 MED ORDER — CITALOPRAM HYDROBROMIDE 20 MG PO TABS
20.0000 mg | ORAL_TABLET | Freq: Every day | ORAL | Status: DC
  Administered 2017-11-21 – 2017-11-26 (×6): 20 mg via ORAL
  Filled 2017-11-20 (×6): qty 1

## 2017-11-20 MED ORDER — OXYCODONE HCL 5 MG PO TABS
5.0000 mg | ORAL_TABLET | ORAL | Status: DC | PRN
  Administered 2017-11-20 – 2017-11-21 (×6): 5 mg via ORAL
  Filled 2017-11-20 (×6): qty 1

## 2017-11-20 MED ORDER — TRAZODONE HCL 50 MG PO TABS
50.0000 mg | ORAL_TABLET | Freq: Every day | ORAL | Status: DC
  Administered 2017-11-20 – 2017-11-25 (×6): 50 mg via ORAL
  Filled 2017-11-20 (×6): qty 1

## 2017-11-20 MED ORDER — TRAMADOL HCL 50 MG PO TABS
50.0000 mg | ORAL_TABLET | Freq: Four times a day (QID) | ORAL | Status: DC
  Administered 2017-11-20 – 2017-11-26 (×21): 50 mg via ORAL
  Filled 2017-11-20 (×21): qty 1

## 2017-11-20 MED ORDER — METHOCARBAMOL 500 MG PO TABS
750.0000 mg | ORAL_TABLET | Freq: Four times a day (QID) | ORAL | Status: DC | PRN
  Administered 2017-11-21 – 2017-11-25 (×14): 750 mg via ORAL
  Filled 2017-11-20 (×15): qty 2

## 2017-11-20 MED ORDER — ACETAMINOPHEN 325 MG PO TABS
650.0000 mg | ORAL_TABLET | Freq: Four times a day (QID) | ORAL | Status: DC | PRN
  Administered 2017-11-20 – 2017-11-24 (×4): 650 mg via ORAL
  Filled 2017-11-20 (×4): qty 2

## 2017-11-20 MED ORDER — SODIUM CHLORIDE 0.9% FLUSH
3.0000 mL | Freq: Two times a day (BID) | INTRAVENOUS | Status: DC
  Administered 2017-11-24 – 2017-11-25 (×4): 3 mL via INTRAVENOUS

## 2017-11-20 MED ORDER — SENNA 8.6 MG PO TABS: 1.0000 | ORAL_TABLET | Freq: Every evening | ORAL | Status: DC | PRN

## 2017-11-20 MED ORDER — SENNOSIDES-DOCUSATE SODIUM 8.6-50 MG PO TABS
1.0000 | ORAL_TABLET | Freq: Every day | ORAL | Status: DC
  Administered 2017-11-25: 1 via ORAL
  Filled 2017-11-20 (×3): qty 1

## 2017-11-20 MED ORDER — ATORVASTATIN CALCIUM 20 MG PO TABS
20.0000 mg | ORAL_TABLET | Freq: Every day | ORAL | Status: DC
  Administered 2017-11-21 – 2017-11-26 (×6): 20 mg via ORAL
  Filled 2017-11-20 (×6): qty 1

## 2017-11-20 MED ORDER — DEXTROSE-NACL 5-0.9 % IV SOLN
INTRAVENOUS | Status: DC
  Administered 2017-11-20: 75 mL/h via INTRAVENOUS
  Administered 2017-11-21: 18:00:00 via INTRAVENOUS
  Administered 2017-11-21: 75 mL/h via INTRAVENOUS

## 2017-11-20 MED ORDER — AMOXICILLIN-POT CLAVULANATE 875-125 MG PO TABS
1.0000 | ORAL_TABLET | Freq: Two times a day (BID) | ORAL | Status: DC
  Administered 2017-11-20 – 2017-11-26 (×12): 1 via ORAL
  Filled 2017-11-20 (×13): qty 1

## 2017-11-20 MED ORDER — SODIUM CHLORIDE 0.9% FLUSH: 3.0000 mL | INTRAVENOUS | Status: DC | PRN

## 2017-11-20 NOTE — Progress Notes (Signed)
Elijah Gillespie is a 47 y.o. male patient status post video bronchoscopy, left video-assisted thoracoscopic surgery with a mini thoracotomy and decortication by Dr. Tyrone Sage.  2 large Blake drains were placed with one straight chest tube.  On 10/30/2017.     No diagnosis found. Past Medical History:  Diagnosis Date  . Depression with anxiety   . GERD (gastroesophageal reflux disease)   . Hyperlipidemia   . Hypertension   . Tobacco abuse    No past surgical history pertinent negatives on file. Scheduled Meds: Current Outpatient Medications on File Prior to Visit  Medication Sig Dispense Refill  . acetaminophen (TYLENOL) 500 MG tablet Take 1 tablet (500 mg total) by mouth every 6 (six) hours as needed for mild pain, fever or headache. 1 tablet 0  . amLODipine (NORVASC) 5 MG tablet Take 1 tablet (5 mg total) by mouth daily. 30 tablet 0  . amoxicillin-clavulanate (AUGMENTIN) 875-125 MG tablet Take 1 tablet by mouth 2 (two) times daily. 28 tablet 0  . atorvastatin (LIPITOR) 20 MG tablet Take 20 mg by mouth every morning.     . citalopram (CELEXA) 20 MG tablet Take 20 mg by mouth daily.    . methocarbamol (ROBAXIN) 750 MG tablet Take 1 tablet (750 mg total) by mouth every 6 (six) hours as needed for muscle spasms. 10 tablet 0  . omeprazole (PRILOSEC) 20 MG capsule Take 20 mg by mouth 2 (two) times daily.    Marland Kitchen oxyCODONE (OXY IR/ROXICODONE) 5 MG immediate release tablet Take 1-2 tablets (5-10 mg total) by mouth every 3 (three) hours as needed for severe pain. 30 tablet 0  . senna (SENOKOT) 8.6 MG TABS tablet Take 1 tablet (8.6 mg total) by mouth at bedtime. 1 each 0  . traMADol (ULTRAM) 50 MG tablet Take 1-2 tablets (50-100 mg total) by mouth every 6 (six) hours as needed for moderate pain. 30 tablet 0   No current facility-administered medications on file prior to visit.      Blood pressure 117/75, pulse (!) 128, temperature 98.1 F (36.7 C), temperature source Oral, resp. rate 20, height  6\' 3"  (1.905 m), weight 258 lb (117 kg), SpO2 97 %.  Subjective patient presented today status post VATS with decortication for shortness of breath.  The patient states that he was in our office last Friday for suture removal appointment and was short of breath then as well.   Objective  Cor: sinus tachycardia Pulm: diffuse wheezing and rhonchi  Abd: no tenderness Ext: no edema Wound: c/d/i    CLINICAL DATA:  Chest pain, shortness of breath.  EXAM: CHEST  2 VIEW  COMPARISON:  Radiographs of November 13, 2017.  FINDINGS: The heart size and mediastinal contours are within normal limits. No pneumothorax is noted. Right lung is clear. Stable moderate left pleural effusion is noted with probable underlying atelectasis. Stable left perihilar densities are noted concerning for inflammation or atelectasis. The visualized skeletal structures are unremarkable.  IMPRESSION: Stable left lung densities and moderate left pleural effusion compared to prior exam.   Electronically Signed   By: Lupita Raider, M.D.   On: 11/20/2017 11:38  Assessment & Plan   He states that his shortness of breath has become worse since his hospital admission.  He has been placed on Augmentin for pneumonia which he has been taking.  He feels like his sputum has changed to more of a clear to white compared to yellow when he was in the hospital.  The chest x-ray  was reviewed with the patient and the wife at the bedside.  He is markedly more tachycardic than he had been while in the hospital with a heart rate in the 120s.  The patient feels short of breath even sitting in a stationary position.  Orthostatics were obtained and when in a lying position blood pressure was 115/75 with a pulse rate of 120, in a sitting position blood pressure was 127/81 with a pulse rate of 124 and in a standing position blood pressure was 105/75 with a pulse rate of 130.  A rhythm strip which was obtained which showed sinus  tachycardia.  I reviewed the chest x-ray with Dr. Maren BeachVantrigt who also saw the patient.  He suggested interventional radiology placing a pigtail catheter on the left side to drain the moderate pleural effusion since the patient was resistant to Lasix therapy and continues to be short of breath.  We have arranged for the patient to be [facility].  We will order cultures of the fluid and he will continue his Augmentin during the admission.     Sharlene Doryessa N [name] 11/20/2017

## 2017-11-20 NOTE — Patient Instructions (Signed)
Admission to [facility] for left pigtail catheter insertion.

## 2017-11-20 NOTE — Progress Notes (Signed)
IR consulted for chest tube placement in patient with history of VATS, mini-thoracotomy and decortication of empyema.   Patient was seen in TCTS office today with complaint of shortness of breath.   CXR today showed: Stable left lung densities and moderate left pleural effusion compared to prior exam.  Reviewed imaging with Dr. Archer AsaMcCullough.  Pleural effusion is noted.  Dr Archer AsaMcCullough suggests further imaging with either US Chest of CT scan with contrast to determine whether fluid may be straight effusion or show concern for empyema vs. Atelectasis.   Spoke with Gershon CraneWayne Gold PA  Loyce DysKacie Matthews, MS RD PA-C 3:09 PM

## 2017-11-21 ENCOUNTER — Inpatient Hospital Stay (HOSPITAL_COMMUNITY): Payer: BLUE CROSS/BLUE SHIELD

## 2017-11-21 LAB — EXPECTORATED SPUTUM ASSESSMENT W GRAM STAIN, RFLX TO RESP C: Special Requests: NORMAL

## 2017-11-21 LAB — EXPECTORATED SPUTUM ASSESSMENT W REFEX TO RESP CULTURE

## 2017-11-21 MED ORDER — MIDAZOLAM HCL 2 MG/2ML IJ SOLN
INTRAMUSCULAR | Status: AC
  Filled 2017-11-21: qty 4

## 2017-11-21 MED ORDER — MORPHINE SULFATE (PF) 4 MG/ML IV SOLN
4.0000 mg | INTRAVENOUS | Status: DC | PRN
  Administered 2017-11-21 – 2017-11-24 (×11): 4 mg via INTRAVENOUS
  Filled 2017-11-21 (×11): qty 1

## 2017-11-21 MED ORDER — SODIUM CHLORIDE 0.9 % IV SOLN
INTRAVENOUS | Status: AC | PRN
  Administered 2017-11-21: 10 mL/h via INTRAVENOUS

## 2017-11-21 MED ORDER — LIDOCAINE HCL 1 % IJ SOLN
INTRAMUSCULAR | Status: AC
  Filled 2017-11-21: qty 20

## 2017-11-21 MED ORDER — OXYCODONE HCL 5 MG PO TABS
10.0000 mg | ORAL_TABLET | ORAL | Status: DC | PRN
  Administered 2017-11-21 – 2017-11-25 (×12): 10 mg via ORAL
  Filled 2017-11-21 (×12): qty 2

## 2017-11-21 MED ORDER — ENOXAPARIN SODIUM 40 MG/0.4ML ~~LOC~~ SOLN
40.0000 mg | SUBCUTANEOUS | Status: DC
  Administered 2017-11-22 – 2017-11-25 (×4): 40 mg via SUBCUTANEOUS
  Filled 2017-11-21 (×4): qty 0.4

## 2017-11-21 MED ORDER — LEVALBUTEROL HCL 0.63 MG/3ML IN NEBU
0.6300 mg | INHALATION_SOLUTION | Freq: Three times a day (TID) | RESPIRATORY_TRACT | Status: DC
  Administered 2017-11-21 – 2017-11-23 (×8): 0.63 mg via RESPIRATORY_TRACT
  Filled 2017-11-21 (×8): qty 3

## 2017-11-21 MED ORDER — FENTANYL CITRATE (PF) 100 MCG/2ML IJ SOLN
INTRAMUSCULAR | Status: AC | PRN
  Administered 2017-11-21 (×3): 25 ug via INTRAVENOUS
  Administered 2017-11-21: 50 ug via INTRAVENOUS
  Administered 2017-11-21 (×3): 25 ug via INTRAVENOUS

## 2017-11-21 MED ORDER — FENTANYL CITRATE (PF) 100 MCG/2ML IJ SOLN
INTRAMUSCULAR | Status: AC
  Filled 2017-11-21: qty 4

## 2017-11-21 MED ORDER — MIDAZOLAM HCL 2 MG/2ML IJ SOLN
INTRAMUSCULAR | Status: AC | PRN
  Administered 2017-11-21: 0.5 mg via INTRAVENOUS
  Administered 2017-11-21: 1 mg via INTRAVENOUS
  Administered 2017-11-21: 0.5 mg via INTRAVENOUS
  Administered 2017-11-21: 1 mg via INTRAVENOUS
  Administered 2017-11-21 (×2): 0.5 mg via INTRAVENOUS

## 2017-11-21 NOTE — Procedures (Addendum)
CT guided placement of 10 Fr drain in small complex left chest empyema.  Difficult to place a larger tube due to patient's pain and discomfort.  Removed 5 ml of bloody fluid from collection.  Attached to PleurEvac.  Minimal blood loss and no immediate complication.    Fluid appears to be very complex and may need thrombolytics to break up material.

## 2017-11-21 NOTE — Progress Notes (Addendum)
      [address]       [name] [id number]             [phone]      Subjective:  Admit from office yesterday with left pleural effusion, worsening shortness of breath.  He continues to have pain at left side and remains short of breath this morning.  Objective: Vital signs in last 24 hours: Temp:  [98.1 F (36.7 C)-99 F (37.2 C)] 99 F (37.2 C) (01/19 0417) Pulse Rate:  [112-128] 118 (01/19 0417) Cardiac Rhythm: Sinus tachycardia (01/18 2000) Resp:  [15-20] 19 (01/19 0417) BP: (117-129)/(75-81) 129/81 (01/19 0417) SpO2:  [97 %] 97 % (01/19 0417) Weight:  [258 lb (117 kg)] 258 lb (117 kg) (01/18 1900)  Intake/Output from previous day: 01/18 0701 - 01/19 0700 In: 1182.5 [P.O.:360; I.V.:822.5] Out: 100 [Urine:100]  General appearance: alert, cooperative and no distress Heart: regular rate and rhythm and tachy Lungs: diminished breath sounds bibasilar and wheezes RUL Abdomen: soft, non-tender; bowel sounds normal; no masses,  no organomegaly Wound: clean and dry  Lab Results: Recent Labs    11/20/17 1500  WBC 10.8*  HGB 11.5*  HCT 37.5*  PLT 384   BMET:  Recent Labs    11/20/17 1500  CREATININE 1.08    PT/INR: No results for input(s): LABPROT, INR in the last 72 hours. ABG    Component Value Date/Time   PHART 7.453 (H) 10/31/2017 0431   HCO3 29.8 (H) 10/31/2017 0431   TCO2 31 10/31/2017 0431   O2SAT 98.0 10/31/2017 0431   CBG (last 3)  No results for input(s): GLUCAP in the last 72 hours.  Assessment/Plan:  1. Left Pleural Effusion- S/P VATS with drainage of empyema 10/30/2017... This is stable in size since hospital discharge on 1/7.... IR consult has been requested for pigtail placement... 2. CV- Sinus Tachycardia, felt to be related to dehydration, has H/O HTN on home Norvasc 3. Pulm- worsening SOB, CT negative for PE, on oxygen, add xopenex nebs, continue IS 4. Renal- creatinine at 1.08, currently on IV Fluids at 75/ml per  hr 5. ID- previous empyema, cx showing rare S. Viridans was d/c on Augmentin which has been ordered 6. Dispo- patient stable, admitted with worsening shortness of breath, left pleural effusion, which is stable since hospital discharge, CT scan negative for PE, awaiting IR to determine if pigtail catheter is indicated, continue ABX, current care   LOS: 1 day    Lowella Dandyrin Barrett 11/21/2017 Patient seen and examined, agree with above Hopefully IR will be able to place a catheter  Viviann SpareSteven C. Dorris FetchHendrickson, MD [facility] [phone]

## 2017-11-21 NOTE — Progress Notes (Signed)
Patient with pain 9/10 at chest tube site, pain is not relieved with current medication for pain. Erin Barrett PAC made aware and orders received. Will monitor patient .Sidney Kann, Randall AnKristin Jessup RN

## 2017-11-21 NOTE — Consult Note (Signed)
Chief Complaint: Patient was seen in consultation today for empyema  Referring Physician(s): Dr. Servando Snare  Supervising Physician: Markus Daft  Patient Status: [facility] - In-pt  History of Present Illness: Elijah Gillespie is a 47 y.o. male with past medical history of GERD, HTN, HLD, depression with anxiety who suffered a manubrium fracture in October 2018.  He developed progressive shortness of breath and intermittent cough. He underwent VATS with mini-thoracotomy and decortication for pneumonia with empyema on 10/30/17.  Patient initially had several chest drain which have since been removed.  He was seen yesterday in outpatient [facility] clinic for follow-up at which time he demonstrated shortness of breath with tachycardia.   CXR showed: Stable left lung densities and moderate left pleural effusion compared to prior exam.  Patient was admitted for further work-up.  IR has been consulted for placement of chest tube.   CT Chest yesterday showed: 1. Left empyema is again identified. When compared with most recent previous exam from 11/07/2017 this is stable to slightly decreased in volume in the interval. Overlying airspace consolidation within the left lower lobe is mildly improved in the interval. 2. Large nodule in right lobe of thyroid gland. Consider further evaluation with thyroid ultrasound. If patient is clinically hyperthyroid, consider nuclear medicine thyroid uptake and scan. 3. Similar appearance of mildly enlarged left hilar and subcarinal lymph nodes which may be reactive in etiology. Right hilar node has increased in size from previous exam  Case reviewed by Dr. Anselm Pancoast who approves patient for procedure.   Patient at breakfast this AM.  He received lovenox overnight.   Past Medical History:  Diagnosis Date  . Depression with anxiety   . GERD (gastroesophageal reflux disease)   . Hyperlipidemia   . Hypertension   . Tobacco abuse     Past Surgical History:    Procedure Laterality Date  . ANKLE SURGERY    . FEMUR FRACTURE SURGERY    . IR THORACENTESIS ASP PLEURAL SPACE W/IMG GUIDE  10/28/2017  . KNEE SURGERY    . ROTATOR CUFF REPAIR    . VIDEO ASSISTED THORACOSCOPY (VATS)/DECORTICATION Left 10/30/2017   Procedure: LEFT VIDEO ASSISTED THORACOSCOPY (VATS)/ MINI THORACOTOMY/ DECORTICATION;  Surgeon: Grace Isaac, MD;  Location: [facility];  Service: Thoracic;  Laterality: Left;  Marland Kitchen VIDEO BRONCHOSCOPY N/A 10/30/2017   Procedure: VIDEO BRONCHOSCOPY;  Surgeon: Grace Isaac, MD;  Location: [facility] OR;  Service: Thoracic;  Laterality: N/A;    Allergies: Patient has no known allergies.  Medications: Prior to Admission medications   Medication Sig Start Date End Date Taking? Authorizing Provider  amLODipine (NORVASC) 5 MG tablet Take 1 tablet (5 mg total) by mouth daily. 11/10/17  Yes Cherene Altes, MD  amoxicillin-clavulanate (AUGMENTIN) 875-125 MG tablet Take 1 tablet by mouth 2 (two) times daily. 11/09/17  Yes Cherene Altes, MD  atorvastatin (LIPITOR) 20 MG tablet Take 20 mg by mouth every morning.    Yes [provider]  citalopram (CELEXA) 20 MG tablet Take 20 mg by mouth daily.   Yes [provider]  methocarbamol (ROBAXIN) 750 MG tablet Take 1 tablet (750 mg total) by mouth every 6 (six) hours as needed for muscle spasms. 11/09/17  Yes Cherene Altes, MD  omeprazole (PRILOSEC) 20 MG capsule Take 20 mg by mouth 2 (two) times daily.   Yes [provider]  oxyCODONE-acetaminophen (PERCOCET/ROXICET) 5-325 MG tablet Take 1 tablet by mouth every 8 (eight) hours as needed for severe pain.    Yes  [provider]  traMADol (ULTRAM) 50 MG tablet Take 1-2 tablets (50-100 mg total) by mouth every 6 (six) hours as needed for moderate pain. 11/09/17  Yes Cherene Altes, MD  traZODone (DESYREL) 50 MG tablet Take 50 mg by mouth at bedtime.   Yes [provider]  acetaminophen (TYLENOL) 500 MG tablet Take 1  tablet (500 mg total) by mouth every 6 (six) hours as needed for mild pain, fever or headache. Patient not taking: Reported on 11/20/2017 11/09/17   Cherene Altes, MD  oxyCODONE (OXY IR/ROXICODONE) 5 MG immediate release tablet Take 1-2 tablets (5-10 mg total) by mouth every 3 (three) hours as needed for severe pain. Patient not taking: Reported on 11/20/2017 11/09/17   Cherene Altes, MD  senna (SENOKOT) 8.6 MG TABS tablet Take 1 tablet (8.6 mg total) by mouth at bedtime. Patient not taking: Reported on 11/20/2017 11/09/17   Cherene Altes, MD     Family History  Problem Relation Age of Onset  . Diabetes Mellitus II Mother   . Breast cancer Mother   . Diabetes Mellitus II Father     Social History   Socioeconomic History  . Marital status: Married    Spouse name: None  . Number of children: None  . Years of education: None  . Highest education level: None  Social Needs  . Financial resource strain: None  . Food insecurity - worry: None  . Food insecurity - inability: None  . Transportation needs - medical: None  . Transportation needs - non-medical: None  Occupational History  . None  Tobacco Use  . Smoking status: Current Every Day Smoker    Packs/day: 1.00    Types: Cigarettes  . Smokeless tobacco: Never Used  Substance and Sexual Activity  . Alcohol use: No    Alcohol/week: 4.8 oz    Types: 4 Cans of beer, 4 Shots of liquor per week    Frequency: Never    Comment: denies use  . Drug use: No  . Sexual activity: None  Other Topics Concern  . None  Social History Narrative  . None   Review of Systems  Constitutional: Negative for fatigue and fever.  Respiratory: Negative for cough and shortness of breath.   Cardiovascular: Negative for chest pain.  Gastrointestinal: Negative for abdominal pain.  Psychiatric/Behavioral: Negative for behavioral problems and confusion.    Vital Signs: BP 121/82   Pulse (!) 118   Temp 99 F (37.2 C) (Oral)   Resp (!) 29    Ht '6\' 3"'$  (1.905 m)   Wt 258 lb (117 kg)   SpO2 95%   BMI 32.25 kg/m   Physical Exam  Constitutional: He is oriented to person, place, and time. He appears well-developed.  Cardiovascular: Normal rate, regular rhythm and normal heart sounds.  Pulmonary/Chest: Effort normal. No respiratory distress.  Abdominal: Soft.  Neurological: He is alert and oriented to person, place, and time.  Skin: Skin is warm and dry.  Psychiatric: He has a normal mood and affect. His behavior is normal. Judgment and thought content normal.  Nursing note and vitals reviewed.   Imaging: Dg Chest 1 View  Result Date: 10/28/2017 CLINICAL DATA:  Followup left thoracentesis. EXAM: CHEST 1 VIEW COMPARISON:  CT same day.  Radiography 10/27/2017. FINDINGS: Newly seen complete opacification of the left hemithorax without mediastinal shift. This could be due to a combination of pleural fluid and pulmonary collapse. No pneumothorax. Right chest remains clear. IMPRESSION: Complete opacification  of the left hemithorax without mediastinal shift. This could be due to a combination of pleural fluid and pulmonary collapse. Electronically Signed   By: Nelson Chimes M.D.   On: 10/28/2017 16:18   Dg Chest 2 View  Result Date: 11/20/2017 CLINICAL DATA:  Chest pain, shortness of breath. EXAM: CHEST  2 VIEW COMPARISON:  Radiographs of November 13, 2017. FINDINGS: The heart size and mediastinal contours are within normal limits. No pneumothorax is noted. Right lung is clear. Stable moderate left pleural effusion is noted with probable underlying atelectasis. Stable left perihilar densities are noted concerning for inflammation or atelectasis. The visualized skeletal structures are unremarkable. IMPRESSION: Stable left lung densities and moderate left pleural effusion compared to prior exam. Electronically Signed   By: Marijo Conception, M.D.   On: 11/20/2017 11:38   Dg Chest 2 View  Result Date: 11/13/2017 CLINICAL DATA:  Follow-up VATS  EXAM: CHEST  2 VIEW COMPARISON:  11/08/2017 FINDINGS: Moderate left pleural effusion is stable since prior study. Left perihilar and lower lobe airspace disease again noted, also stable. Right lung is clear. Heart is normal size. IMPRESSION: Continued moderate left pleural effusion and left lung opacities, not significantly changed. Electronically Signed   By: Rolm Baptise M.D.   On: 11/13/2017 11:30   Dg Chest 2 View  Result Date: 11/08/2017 CLINICAL DATA:  Status post bronchoscopy and chest tube EXAM: CHEST  2 VIEW COMPARISON:  CTA chest dated 11/07/2017 FINDINGS: Increased interstitial markings in the left upper lobe. Left lower lobe opacity, better evaluated on CT chest. Small left pleural effusion. Right lung is clear.  No pneumothorax. The heart is normal in size. IMPRESSION: Left lung opacities, better evaluated on recent CT chest. Small left pleural effusion. No pneumothorax. Electronically Signed   By: Julian Hy M.D.   On: 11/08/2017 09:08   Dg Chest 2 View  Result Date: 11/05/2017 CLINICAL DATA:  Chest injury.  Recent chest tube removal. EXAM: CHEST  2 VIEW COMPARISON:  11/04/2017 . FINDINGS: Interim removal of left chest tube. No pneumothorax. Mediastinum and hilar structures are stable. Heart size stable. Stable left lung atelectatic change. Mild left lung infiltrate cannot be excluded. Stable left-sided pleural thickening and elevation of left hemidiaphragm. No pneumothorax. IMPRESSION: Interim removal of left chest tube. No pneumothorax. Stable atelectatic changes in the left lung. Mild left lung infiltrate cannot be excluded. Stable left-sided pleural thickening and elevation left hemidiaphragm. Electronically Signed   By: Marcello Moores  Register   On: 11/05/2017 10:40   Dg Chest 2 View  Result Date: 10/30/2017 CLINICAL DATA:  Preoperative chest radiograph for VATS. EXAM: CHEST  2 VIEW COMPARISON:  Chest radiograph performed earlier today at 11:40 a.m., and CT of the chest performed  10/28/2017 FINDINGS: The patient's complex large left-sided pleural effusion is again noted, with underlying airspace consolidation. A left basilar chest tube is noted. The right lung appears relatively clear. No pneumothorax is seen. The heart is not well characterized due to opacification of the left hemithorax. No acute osseous abnormalities are seen. IMPRESSION: Complex large left-sided pleural effusion again noted, with underlying airspace consolidation. Electronically Signed   By: Garald Balding M.D.   On: 10/30/2017 01:01   Dg Chest 2 View  Result Date: 10/28/2017 CLINICAL DATA:  Acute onset of cough and tachypnea. Tachycardia. Subacute onset of shortness of breath. Prior sternal fracture in October. EXAM: CHEST  2 VIEW COMPARISON:  Chest radiograph performed 09/20/2017 FINDINGS: Mild left basilar airspace opacity likely reflects atelectasis. No significant  pleural effusion or pneumothorax is seen The heart is normal in size. No acute osseous abnormalities are identified. IMPRESSION: Mild left basilar airspace opacity likely reflects atelectasis. Lungs otherwise clear. Electronically Signed   By: Garald Balding M.D.   On: 10/28/2017 00:13   Ct Chest Wo Contrast  Result Date: 10/28/2017 CLINICAL DATA:  Pleural effusion headache, pneumonia, or recent left chest trauma with healing left rib fractures. Status post thoracentesis. Post thoracentesis chest x-ray demonstrated complete opacification of the left chest. EXAM: CT CHEST WITHOUT CONTRAST TECHNIQUE: Multidetector CT imaging of the chest was performed following the standard protocol without IV contrast. COMPARISON:  10/28/2017 FINDINGS: Cardiovascular: Normal heart size. No pericardial effusion patent. Minor thoracic aortic atherosclerosis. Mediastinum/Nodes: Right thyroid nodular enlargement as before measuring up to 5 cm. Recommend follow-up thyroid ultrasound outpatient. Trachea and esophagus unremarkable by noncontrast imaging. No adenopathy.  Lungs/Pleura: Continued enlargement of the left effusion with developing loculation superiorly. This is despite having a left thoracentesis yielding 510 cc earlier today. Effusion has low Hounsfield units and does not appear to represent hemothorax. There is now near-complete collapse/consolidation of the left lung. Difficult to exclude central mucous plugging as well. Minor right base atelectasis.  No right effusion. Upper Abdomen: No significant or acute finding by noncontrast imaging Musculoskeletal: Healing posterior left rib fractures. No other acute osseous finding. IMPRESSION: Continued enlargement in the left effusion with developing loculation in the apex. Worsening near complete collapse / consolidation of the entire left lung. Difficult to exclude a component of central mucous plugging. Effusion remains low attenuation with Hounsfield measurements less than 15. No evidence of hemothorax following thoracentesis. These results were called by telephone at the time of interpretation on 10/28/2017 at 5:44 pm to Dr. Algis Liming, who verbally acknowledged these results. Electronically Signed   By: Jerilynn Mages.  Shick M.D.   On: 10/28/2017 17:46   Ct Chest W Contrast  Result Date: 11/20/2017 CLINICAL DATA:  Shortness of breath. EXAM: CT CHEST WITH CONTRAST TECHNIQUE: Multidetector CT imaging of the chest was performed during intravenous contrast administration. CONTRAST:  9m ISOVUE-300 IOPAMIDOL (ISOVUE-300) INJECTION 61% COMPARISON:  11/07/2017 and 10/28/2017. FINDINGS: Cardiovascular: Heart size is normal. There is no pericardial effusion identified. Mediastinum/Nodes: The trachea appears patent and is midline. Normal appearance of the esophagus. Left hilar lymph node measures 1.3 cm, image 72 of series 3. Unchanged from previous exam. There is a subcarinal lymph node measuring 1.4 cm. Also unchanged from previous exam. Enlarging right hilar lymph node measures 1.5 cm, image 80 of series 3. Previously 0.8 cm. Large  hypodense nodule within the right lobe of thyroid gland measures 4.7 cm. Lungs/Pleura: Small to moderate volume loculated pleural fluid collection is again identified. This contains multiple foci of gas concerning for empyema. Overlying airspace consolidation in atelectasis noted. When compared with study from 11/07/2017 this is stable to slightly decreased in volume from previous exam. Aeration to the left lower lobe appears improved in the interval. Upper Abdomen: No acute abnormality. Musculoskeletal: Multiple subacute to chronic left posterior rib fractures are identified. These appear similar to 10/28/17. chronic appearing right anterolateral rib fracture deformities are also similar to 10/28/17. IMPRESSION: 1. Left empyema is again identified. When compared with most recent previous exam from 11/07/2017 this is stable to slightly decreased in volume in the interval. Overlying airspace consolidation within the left lower lobe is mildly improved in the interval. 2. Large nodule in right lobe of thyroid gland. Consider further evaluation with thyroid ultrasound. If patient is clinically hyperthyroid, consider nuclear  medicine thyroid uptake and scan. 3. Similar appearance of mildly enlarged left hilar and subcarinal lymph nodes which may be reactive in etiology. Right hilar node has increased in size from previous exam Electronically Signed   By: Kerby Moors M.D.   On: 11/20/2017 22:21   Ct Angio Chest Pe W Or Wo Contrast  Result Date: 11/07/2017 CLINICAL DATA:  Chest pain and shortness of breath. Chest tubes recently removed. Positive D-dimer. EXAM: CT ANGIOGRAPHY CHEST WITH CONTRAST TECHNIQUE: Multidetector CT imaging of the chest was performed using the standard protocol during bolus administration of intravenous contrast. Multiplanar CT image reconstructions and MIPs were obtained to evaluate the vascular anatomy. CONTRAST:  17m ISOVUE-370 IOPAMIDOL (ISOVUE-370) INJECTION 76% COMPARISON:  10/28/2017  FINDINGS: Cardiovascular: Moderately good opacification of the central and segmental pulmonary arteries. No focal filling defects demonstrated. No evidence of significant pulmonary embolus. Normal heart size. No pericardial effusion. Normal caliber thoracic aorta. No evidence of aortic dissection. Great vessel origins are patent. Mediastinum/Nodes: Scattered mediastinal lymph nodes are moderately prominent without pathologic enlargement, likely reactive. Diffuse heterogeneous enlargement of the right lobe of the thyroid gland likely representing goiter. No change since prior study. Esophagus is decompressed. Lungs/Pleura: Small to moderate left pleural effusion with multiple gas bubbles. Gas in the effusion could relate from previous catheter insertion or may indicate infection/empyema. Consolidation in the left lower lung likely representing pneumonia or atelectasis. Follow-up after resolution of acute process is recommended to exclude underlying neoplasm which remains a possibility. Airways are patent. Right lung is clear. Upper Abdomen: No acute process demonstrated in the visualized upper abdominal organs. Musculoskeletal: Soft tissue infiltration and subcutaneous gas in the left chest wall with small left chest wall hematoma. This likely represents the previous catheter insertion site. Multiple bilateral old rib fractures. Subacute left lower eleventh rib fracture. Review of the MIP images confirms the above findings. IMPRESSION: 1. No evidence of significant pulmonary embolus. 2. Probable reactive lymph nodes in the mediastinum. 3. Heterogeneous enlargement of the right thyroid likely represents goiter. 4. Moderate left pleural effusion with multiple gas bubbles. This could relate to previous catheter insertion or may indicate infection/empyema. Left lower lung consolidation may represent pneumonia or atelectasis. Follow-up after resolution of acute process is recommended to exclude underlying neoplasm which  remains a possibility. 5. Soft tissue infiltration, subcutaneous gas, and small hematoma in the left chest wall likely representing a previous catheter insertion site. 6. Old bilateral rib fractures. Electronically Signed   By: WLucienne CapersM.D.   On: 11/07/2017 18:32   Ct Angio Chest Pe W And/or Wo Contrast  Result Date: 10/28/2017 CLINICAL DATA:  47year old male with difficulty breathing. EXAM: CT ANGIOGRAPHY CHEST WITH CONTRAST TECHNIQUE: Multidetector CT imaging of the chest was performed using the standard protocol during bolus administration of intravenous contrast. Multiplanar CT image reconstructions and MIPs were obtained to evaluate the vascular anatomy. CONTRAST:  1077mISOVUE-370 IOPAMIDOL (ISOVUE-370) INJECTION 76% COMPARISON:  Chest radiograph dated 10/27/2017 head CT dated 08/23/2017 FINDINGS: Cardiovascular: There is no cardiomegaly or pericardial effusion. The thoracic aorta appears unremarkable. The origins of the great vessels of the aortic arch appear patent. Evaluation of the pulmonary artery is limited due to suboptimal opacification of the peripheral branches. No definite large or central pulmonary artery embolus identified. Mediastinum/Nodes: No right hilar or mediastinal adenopathy. Evaluation for left hilar lymph node is limited due to consolidative changes of adjacent lung. These however mildly enlarged appearing left hilar lymph nodes measuring 1.5 cm. There is a 5.2 cm  right thyroid nodule as seen on the prior CT. Further evaluation with ultrasound, if not already performed, recommended. Lungs/Pleura: There is a moderate size left pleural effusion with associated partial compressive atelectasis of the left lower lobe versus pneumonia. Clinical correlation is recommended. The right lung is clear. A No pneumothorax. The central airways are patent. Upper Abdomen: There is a small hiatal hernia. Fluid within the esophagus consistent with gastroesophageal reflux. Musculoskeletal:  Multiple subacute or chronic left posterior rib fractures, with incomplete healing. No acute fracture. Review of the MIP images confirms the above findings. IMPRESSION: 1. No CT evidence of central pulmonary artery embolus. 2. Moderate left pleural effusion and left lung base atelectasis versus infiltrate. 3. A 5.2 cm right thyroid nodule.  She Electronically Signed   By: Anner Crete M.D.   On: 10/28/2017 01:53   Dg Chest Port 1 View  Result Date: 11/04/2017 CLINICAL DATA:  Follow-up examination for chest tube. EXAM: PORTABLE CHEST 1 VIEW COMPARISON:  Prior radiograph from 11/03/2017. FINDINGS: Stable cardiomegaly.  Mediastinal silhouette within normal limits. Lungs hypoinflated. Left-sided chest tube remains in place with tip projecting over the lateral left upper lobe, stable. There has been interval removal of a basilar left-sided chest tube. Volume loss within the left hemithorax. Similar left pleural thickening with associated left basilar opacity. No appreciable pneumothorax. Right lung remains largely clear. Osseous structures unchanged. IMPRESSION: 1. Stable position of left apical chest tube with tip overlying the left upper lobe. Interval removal of additional basilar left-sided chest tube. No appreciable pneumothorax. 2. Similar volume loss within the left lung with associated left pleural thickening and left basilar atelectasis and/or scarring. Electronically Signed   By: Jeannine Boga M.D.   On: 11/04/2017 04:04   Dg Chest Port 1 View  Result Date: 11/03/2017 CLINICAL DATA:  Chest tube in place.  Lung surgery. EXAM: PORTABLE CHEST 1 VIEW COMPARISON:  11/01/2017 chest radiograph. FINDINGS: Apical and basilar left chest tubes are in place. Stable cardiomediastinal silhouette with normal heart size. No pneumothorax. No right pleural effusion. Stable volume loss in the left hemithorax with diffuse left pleural thickening. Stable scarring and atelectasis in the left lung. Clear right lung.  IMPRESSION: 1. No pneumothorax. 2. Stable pleural thickening and volume loss in the left hemithorax. Stable scarring and atelectasis in the left lung. Electronically Signed   By: Ilona Sorrel M.D.   On: 11/03/2017 08:07   Dg Chest Port 1 View  Result Date: 11/01/2017 CLINICAL DATA:  Postsurgical follow-up EXAM: PORTABLE CHEST 1 VIEW COMPARISON:  Chest radiograph from one day prior. FINDINGS: Right internal jugular central venous catheter terminates in the lower third of the superior vena cava. Two left apical and single left basilar chest tubes are stable in position. Stable cardiomediastinal silhouette with normal heart size. No pneumothorax. No right pleural effusion. Stable volume loss in the left hemithorax. Hazy and curvilinear opacities throughout the left lung are stable. Stable left pleural thickening. Mild right basilar atelectasis is minimally increased. IMPRESSION: 1. No pneumothorax.  Stable left chest tubes . 2. Stable volume loss in left hemithorax with stable hazy and curvilinear left lung opacities, probably a combination of scarring and atelectasis . Stable left pleural thickening. 3. Mild right basilar atelectasis, minimally increased. Electronically Signed   By: Ilona Sorrel M.D.   On: 11/01/2017 11:24   Dg Chest Port 1 View  Result Date: 10/31/2017 CLINICAL DATA:  Postop from VATS. Left pleural effusion. On ventilator. EXAM: PORTABLE CHEST 1 VIEW COMPARISON:  10/30/2017 FINDINGS:  Support lines and tubes remain in stable position. Low lung volumes again seen. Diffuse left pleural thickening shows no significant change. No pneumothorax visualized. Atelectasis is seen bilaterally predominantly in the lung bases, left side greater than right. These findings show no significant interval change. IMPRESSION: No significant change in left pleural thickening and left greater than right atelectasis. No pneumothorax visualized. Electronically Signed   By: Earle Gell M.D.   On: 10/31/2017 10:24    Dg Chest Port 1 View  Result Date: 10/30/2017 CLINICAL DATA:  Prior VATS.  Adjustment of endotracheal tube. EXAM: PORTABLE CHEST 1 VIEW COMPARISON:  Prior exam same day. FINDINGS: Endotracheal tube has been partially withdrawn. Tip is now 2 cm above the carina in good anatomic position. NG tube noted with tip below left hemidiaphragm. Right IJ line noted with tip at cavoatrial junction. Three left chest tubes in stable position. No pneumothorax. Stable atelectatic changes and possible infiltrate in the left lung. Stable mild right base atelectasis. Small right pleural effusion cannot be excluded . IMPRESSION: Interim interim repositioning of endotracheal tube. Its tip is in good anatomic position 2 cm above the carina. Exam is otherwise unchanged. Three left chest tubes are in stable position. No pneumothorax. Electronically Signed   By: Marcello Moores  Register   On: 10/30/2017 12:16   Dg Chest Port 1 View  Addendum Date: 10/30/2017   ADDENDUM REPORT: 10/30/2017 11:27 ADDENDUM: Two new left chest tubes in place, the tips are over the left upper chest. Left lower chest tube is in stable position. No pneumothorax. Electronically Signed   By: Marcello Moores  Register   On: 10/30/2017 11:27   Result Date: 10/30/2017 CLINICAL DATA:  Left chest tube. EXAM: PORTABLE CHEST 1 VIEW COMPARISON:  10/29/2017. FINDINGS: Two new left chest tubes are noted with their tips over the left upper chest. Left lower chest tube is again noted in stable position. No pneumothorax. Previously identified large left pleural effusion has largely been removed. Endotracheal tube tip noted just above the orifice of the right mainstem bronchus. Repositioning suggested, withdrawal of approximately 3 cm should be considered. Right IJ line noted with tip over the right atrium. Diffuse left lung atelectatic changes and/or infiltrate. Right base atelectasis. IMPRESSION: 1. Endotracheal tube tip noted just above the orifice of the right mainstem bronchus.  Repositioning should be considered. Withdrawal of endotracheal tube by approximately 3 cm should be considered. 2. Two new left chest tubes in place, the tips are over the left upper chest. No pneumothorax. Interim resolution of previously identified left pleural effusion. Right IJ line in good anatomic position. 3. Diffuse left lung atelectatic changes and/or infiltrate. Mild right base atelectatic change. Critical Value/emergent results were called by telephone at the time of interpretation on 10/30/2017 at 11:04 am to nurse Colletta Maryland, who verbally acknowledged these results. Electronically Signed: By: Marcello Moores  Register On: 10/30/2017 11:05   Dg Chest Port 1 View  Result Date: 10/29/2017 CLINICAL DATA:  Atelectasis, dyspnea EXAM: PORTABLE CHEST 1 VIEW COMPARISON:  Chest radiograph from earlier today. FINDINGS: Left basilar chest tube is stable in position. No pneumothorax. Minimally improved limited aeration of the left lung with near complete opacification of the left hemithorax and persistent volume loss in the left hemithorax. No mediastinal shift. No right pleural effusion. Right lung is clear. IMPRESSION: 1. Stable left basilar chest tube.  No pneumothorax. 2. Minimally improved limited aeration of the left lung with persistent near complete opacification of and volume loss within the left hemithorax, compatible with a combination  of loculated left pleural effusion and left lung atelectasis. Underlying mass or pneumonia in the left lung cannot be excluded. Electronically Signed   By: Ilona Sorrel M.D.   On: 10/29/2017 12:08   Dg Chest Port 1 View  Result Date: 10/29/2017 CLINICAL DATA:  Left pleural effusion with chest tube drainage. EXAM: PORTABLE CHEST 1 VIEW COMPARISON:  Portable chest x-ray of October 28, 2017 FINDINGS: The right lung is well-expanded and clear. On the left minimal aerated lung is observed similar to that seen yesterday. The chest tube is in stable position at the lung base. The  left heart border is completely obscured. The pulmonary vascularity on the right is clear. IMPRESSION: Persistent near-total opacification of the left hemithorax despite the presence of the left chest tube. No significant improvement since yesterday's study. Electronically Signed   By: David  Martinique M.D.   On: 10/29/2017 07:48   Dg Chest Port 1 View  Result Date: 10/28/2017 CLINICAL DATA:  Chest tube EXAM: PORTABLE CHEST 1 VIEW COMPARISON:  10/28/2017, 08/23/2017 FINDINGS: Right lung is grossly clear. Placement of left lower chest tube with tip projecting slightly to the left of the lower thoracic spine. Extensive opacity in the left thorax with slight improved aeration at the peripheral left lower lung. No pneumothorax. Slight shift of mediastinal contents to the left. IMPRESSION: Interval insertion of left lower chest to with slightly improved aeration of the left lower lateral lung. Extensive airspace disease an opacity within the left thorax remains. Electronically Signed   By: Donavan Foil M.D.   On: 10/28/2017 19:31   Dg Abd Portable 1v  Result Date: 10/30/2017 CLINICAL DATA:  Check gastric catheter placement EXAM: PORTABLE ABDOMEN - 1 VIEW COMPARISON:  None. FINDINGS: Gastric catheter is noted within the stomach. The proximal side port is just beyond the gastroesophageal junction. Multiple left-sided thoracostomy catheters are noted. Nonobstructive bowel gas pattern is seen. IMPRESSION: Catheter noted within the stomach as described. Electronically Signed   By: Inez Catalina M.D.   On: 10/30/2017 12:32   US Thyroid  Result Date: 11/03/2017 CLINICAL DATA:  47 year old male with a history of thyroid nodule on a prior CT chest EXAM: THYROID ULTRASOUND TECHNIQUE: Ultrasound examination of the thyroid gland and adjacent soft tissues was performed. COMPARISON:  Chest CT 10/08/2025 2018 FINDINGS: Parenchymal Echotexture: Normal Isthmus: 0.7 cm Right lobe: 6.5 cm x 4.2 cm x 4.5 cm Left lobe: 5.0 cm x 1.9  cm x 2.1 cm _________________________________________________________ Estimated total number of nodules >/= 1 cm:  1 Number of spongiform nodules >/=  2 cm not described below (TR1): 0 Number of mixed cystic and solid nodules >/= 1.5 cm not described below ([place]): 0 _________________________________________________________ Nodule # 1: Location: Isthmus; Mid Maximum size: 0.7 cm; Other 2 dimensions: 0.7 cm x 0.6 cm Composition: cannot determine (2) Echogenicity: isoechoic (1) Shape: not taller-than-wide (0) Margins: ill-defined (0) Echogenic foci: none (0) ACR TI-RADS total points: 3. ACR TI-RADS risk category: TR3 (3 points). ACR TI-RADS recommendations: Nodule does not meet criteria for surveillance or biopsy _________________________________________________________ Nodule # 1: Location: Right; Mid Maximum size: 0.9 cm; Other 2 dimensions: In 0.5 cm x 0.7 cm Composition: cannot determine (2) Echogenicity: isoechoic (1) Shape: not taller-than-wide (0) Margins: ill-defined (0) Echogenic foci: none (0) ACR TI-RADS total points: 3. ACR TI-RADS risk category: TR3 (3 points). ACR TI-RADS recommendations: Nodule does not meet criteria for surveillance or biopsy _________________________________________________________ Nodule # 2: Location: Right; Mid Maximum size: 5.3 cm cm; Other 2 dimensions: 4.0  cm x 3.7 cm cm Composition: mixed cystic and solid (1) Echogenicity: isoechoic (1) Shape: taller-than-wide (3) Margins: smooth (0) Echogenic foci: none (0) ACR TI-RADS total points: 5. ACR TI-RADS risk category: TR4 (4-6 points). ACR TI-RADS recommendations: Nodule meets criteria for biopsy _________________________________________________________ Nodule # 1: Location: Left; Mid Maximum size: 0.9 cm; Other 2 dimensions: 0.6 cm x 0.6 cm cm Composition: solid/almost completely solid (2) Echogenicity: isoechoic (1) Shape: not taller-than-wide (0) Margins: ill-defined (0) Echogenic foci: none (0) ACR TI-RADS total points: 3. ACR  TI-RADS risk category: TR3 (3 points). ACR TI-RADS recommendations: Nodule does not meet criteria for surveillance or biopsy _________________________________________________________ No adenopathy IMPRESSION: Right mid thyroid nodule (labeled 2) meets criteria for biopsy, as designated by the newly established ACR TI-RADS criteria, and referral for biopsy is recommended. Remainder of nodules do not meet criteria for surveillance. Recommendations follow those established by the new ACR TI-RADS criteria (J Am Coll Radiol 1601;09:323-557). Electronically Signed   By: Corrie Mckusick D.O.   On: 11/03/2017 13:08   Ir Thoracentesis Asp Pleural Space W/img Guide  Result Date: 10/28/2017 INDICATION: Recent left chest trauma, left effusion, pneumonia, shortness of breath, fever, chest pain EXAM: ULTRASOUND GUIDED LEFT THORACENTESIS MEDICATIONS: 1% lidocaine local COMPLICATIONS: None immediate. PROCEDURE: An ultrasound guided thoracentesis was thoroughly discussed with the patient and questions answered. The benefits, risks, alternatives and complications were also discussed. The patient understands and wishes to proceed with the procedure. Written consent was obtained. Ultrasound was performed to localize and mark an adequate pocket of fluid in the left chest. The area was then prepped and draped in the normal sterile fashion. 1% Lidocaine was used for local anesthesia. Under ultrasound guidance a 6 Fr Safe-T-Centesis catheter was introduced. Thoracentesis was performed. The catheter was removed and a dressing applied. FINDINGS: A total of approximately 510 cc of exudative pleural fluid was removed. Samples were sent to the laboratory as requested by the clinical team. IMPRESSION: Successful ultrasound guided left thoracentesis yielding 510 cc of pleural fluid. Electronically Signed   By: Jerilynn Mages.  Shick M.D.   On: 10/28/2017 16:22    Labs:  CBC: Recent Labs    11/05/17 0845 11/08/17 0243 11/09/17 0239 11/20/17 1500    WBC 15.0* 16.8* 15.5* 10.8*  HGB 11.1* 9.9* 9.4* 11.5*  HCT 35.3* 32.9* 31.0* 37.5*  PLT 377 387 402* 384    COAGS: Recent Labs    10/28/17 0551 10/29/17 1916  INR 1.24 1.17  APTT 41* 44*    BMP: Recent Labs    11/03/17 0151 11/04/17 0238 11/05/17 0845 11/08/17 0243 11/20/17 1500  NA 137 136 133* 130*  --   K 3.5 3.8 4.2 4.2  --   CL 97* 103 96* 95*  --   CO2 '31 27 27 28  '$ --   GLUCOSE 123* 103* 110* 114*  --   BUN '8 6 6 9  '$ --   CALCIUM 8.3* 8.2* 8.6* 8.7*  --   CREATININE 0.71 0.68 0.79 0.86 1.08  GFRNONAA >60 >60 >60 >60 >60  GFRAA >60 >60 >60 >60 >60    LIVER FUNCTION TESTS: Recent Labs    10/29/17 1916 11/01/17 0610  BILITOT 0.5 0.6  AST 15 27  ALT 24 20  ALKPHOS 70 66  PROT 5.5* 5.4*  ALBUMIN 1.7* 1.7*    TUMOR MARKERS: No results for input(s): AFPTM, CEA, CA199, CHROMGRNA in the last 8760 hours.  Assessment and Plan: Empyema IR consulted for chest tube placement.  Dr Anselm Pancoast has reviewed  case and approves patient for procedure.  PA met with patient and wife at bedside to discuss.  He did eat breakfast at 7AM this morning and his last dose of lovenox was overnight around 10PM.  Will plan for chest tube placement this afternoon.  Risks and benefits discussed with the patient including bleeding, infection, damage to adjacent structures, and sepsis. All of the patient's questions were answered, patient is agreeable to proceed. Consent signed and in chart.  Thank you for this interesting consult.  I greatly enjoyed meeting Elijah Gillespie and look forward to participating in their care.  A copy of this report was sent to the requesting provider on this date.  Electronically Signed: Docia Barrier, PA 11/21/2017, 9:51 AM   I spent a total of 40 Minutes    in face to face in clinical consultation, greater than 50% of which was counseling/coordinating care for empyema.

## 2017-11-21 NOTE — Progress Notes (Signed)
Patient arrived from CT with Left chest tube in place and  placed to suction as ordered. BP obtained as ordered. Will monitor patient. Amal Saiki, Randall AnKristin Jessup RN

## 2017-11-22 ENCOUNTER — Inpatient Hospital Stay (HOSPITAL_COMMUNITY): Payer: BLUE CROSS/BLUE SHIELD

## 2017-11-22 LAB — CBC
HCT: 32.9 % — ABNORMAL LOW (ref 39.0–52.0)
Hemoglobin: 9.8 g/dL — ABNORMAL LOW (ref 13.0–17.0)
MCH: 25.1 pg — ABNORMAL LOW (ref 26.0–34.0)
MCHC: 29.8 g/dL — ABNORMAL LOW (ref 30.0–36.0)
MCV: 84.1 fL (ref 78.0–100.0)
PLATELETS: 312 10*3/uL (ref 150–400)
RBC: 3.91 MIL/uL — AB (ref 4.22–5.81)
RDW: 15.9 % — ABNORMAL HIGH (ref 11.5–15.5)
WBC: 5.7 10*3/uL (ref 4.0–10.5)

## 2017-11-22 LAB — BASIC METABOLIC PANEL
Anion gap: 10 (ref 5–15)
BUN: 5 mg/dL — ABNORMAL LOW (ref 6–20)
CALCIUM: 8.7 mg/dL — AB (ref 8.9–10.3)
CHLORIDE: 102 mmol/L (ref 101–111)
CO2: 26 mmol/L (ref 22–32)
CREATININE: 0.79 mg/dL (ref 0.61–1.24)
GFR calc non Af Amer: >60 mL/min (ref >60)
Glucose, Bld: 133 mg/dL — ABNORMAL HIGH (ref 65–99)
Potassium: 3.8 mmol/L (ref 3.5–5.1)
SODIUM: 138 mmol/L (ref 135–145)

## 2017-11-22 NOTE — Progress Notes (Addendum)
      [address]       [name] [id number]             [phone]      Subjective:  Patient doing better this morning.  Contacted yesterday in regards to increased pain after placement of chest tube.  He is utilizing Morphine with relief of pain.  He also states he feels like he can breath better since the chest tube was placed.  Objective: Vital signs in last 24 hours: Temp:  [98.3 F (36.8 C)-99.3 F (37.4 C)] 98.7 F (37.1 C) (01/20 0412) Pulse Rate:  [90-116] 90 (01/20 0412) Cardiac Rhythm: Sinus tachycardia (01/20 0806) Resp:  [15-29] 18 (01/20 0412) BP: (111-128)/(72-87) 123/76 (01/20 0412) SpO2:  [93 %-99 %] 94 % (01/20 0412)  Intake/Output from previous day: 01/19 0701 - 01/20 0700 In: 1912.8 [P.O.:1080; I.V.:832.8] Out: 10 [Chest Tube:10]  General appearance: alert, cooperative and no distress Heart: regular rate and rhythm Lungs: diminished breath sounds on left Abdomen: soft, non-tender; bowel sounds normal; no masses,  no organomegaly Wound: clean and dry  Lab Results: Recent Labs    11/20/17 1500 11/22/17 0437  WBC 10.8* 5.7  HGB 11.5* 9.8*  HCT 37.5* 32.9*  PLT 384 312   BMET:  Recent Labs    11/20/17 1500 11/22/17 0437  NA  --  138  K  --  3.8  CL  --  102  CO2  --  26  GLUCOSE  --  133*  BUN  --  5*  CREATININE 1.08 0.79  CALCIUM  --  8.7*    PT/INR: No results for input(s): LABPROT, INR in the last 72 hours. ABG    Component Value Date/Time   PHART 7.453 (H) 10/31/2017 0431   HCO3 29.8 (H) 10/31/2017 0431   TCO2 31 10/31/2017 0431   O2SAT 98.0 10/31/2017 0431   CBG (last 3)  No results for input(s): GLUCAP in the last 72 hours.  Assessment/Plan:  1. Left Pleural Effusion- S/P VATS 10/30/2017, continued pleural effusion, IR catheter placed yesterday... 45 ml of output since placed, Radiology states may need thrombolytics due to complex nature of collection 2. CV- NSR, tachycardia improved, patient taking good  oral intake will d/c IV Fluids 3. Pulm- dyspnea improved, off oxygen, continue nebs, IS 4. Renal- creatinine WNL 5. ID- sputum culture results remain pending, remains afebrile, no leukocytosis, on Augmentin from previous admission for rare S. Viridans, continue ABX 6. Dispo- patient stable, 45 cc output of catheter since placement, patient feels better with his breathing, ? Thrombolytics?, continue IV pain medication for now, repeat CXR in AM   LOS: 2 days    Lowella Dandyrin Barrett 11/22/2017 Patient seen and examined, agree with above Will defer to Dr. Tyrone SageGerhardt on question of thrombolytics to drain remainder of fluid  Viviann SpareSteven C. Dorris FetchHendrickson, MD [facility] [phone]

## 2017-11-22 NOTE — Progress Notes (Signed)
Referring Physician(s): Dr. Tyrone SageGerhardt  Supervising Physician: Richarda OverlieHenn, Adam  [facility]  [facility]  Chief Complaint:  Empyema  Subjective: Chest tube in place.  Sore related to drain. States he can breath better and feels less short of breath.   Allergies: Patient has no known allergies.  Medications: Prior to Admission medications   Medication Sig Start Date End Date Taking? Authorizing Provider  amLODipine (NORVASC) 5 MG tablet Take 1 tablet (5 mg total) by mouth daily. 11/10/17  Yes Lonia BloodMcClung, Jeffrey T, MD  amoxicillin-clavulanate (AUGMENTIN) 875-125 MG tablet Take 1 tablet by mouth 2 (two) times daily. 11/09/17  Yes Lonia BloodMcClung, Jeffrey T, MD  atorvastatin (LIPITOR) 20 MG tablet Take 20 mg by mouth every morning.    Yes [provider]  citalopram (CELEXA) 20 MG tablet Take 20 mg by mouth daily.   Yes [provider]  methocarbamol (ROBAXIN) 750 MG tablet Take 1 tablet (750 mg total) by mouth every 6 (six) hours as needed for muscle spasms. 11/09/17  Yes Lonia BloodMcClung, Jeffrey T, MD  omeprazole (PRILOSEC) 20 MG capsule Take 20 mg by mouth 2 (two) times daily.   Yes [provider]  oxyCODONE-acetaminophen (PERCOCET/ROXICET) 5-325 MG tablet Take 1 tablet by mouth every 8 (eight) hours as needed for severe pain.    Yes [provider]  traMADol (ULTRAM) 50 MG tablet Take 1-2 tablets (50-100 mg total) by mouth every 6 (six) hours as needed for moderate pain. 11/09/17  Yes Lonia BloodMcClung, Jeffrey T, MD  traZODone (DESYREL) 50 MG tablet Take 50 mg by mouth at bedtime.   Yes [provider]  acetaminophen (TYLENOL) 500 MG tablet Take 1 tablet (500 mg total) by mouth every 6 (six) hours as needed for mild pain, fever or headache. Patient not taking: Reported on 11/20/2017 11/09/17   Lonia BloodMcClung, Jeffrey T, MD  oxyCODONE (OXY IR/ROXICODONE) 5 MG immediate release tablet Take 1-2 tablets (5-10 mg total) by mouth every 3 (three) hours as needed for severe pain. Patient  not taking: Reported on 11/20/2017 11/09/17   Lonia BloodMcClung, Jeffrey T, MD  senna (SENOKOT) 8.6 MG TABS tablet Take 1 tablet (8.6 mg total) by mouth at bedtime. Patient not taking: Reported on 11/20/2017 11/09/17   Lonia BloodMcClung, Jeffrey T, MD     Vital Signs: BP 135/82 (BP Location: Left Arm)   Pulse (!) 102   Temp 98.7 F (37.1 C) (Oral)   Resp 15   Ht 6\' 3"  (1.905 m)   Wt 258 lb (117 kg)   SpO2 96%   BMI 32.25 kg/m   Physical Exam  NAD, alert Chest:  Tube in place.  Dressing intact.  Thick, bloody output in drain.  50 mL of fluid in PleurVac.   Imaging: Dg Chest 2 View  Result Date: 11/21/2017 CLINICAL DATA:  47 year old male status post left VATS for left chest empyema on 10/30/2017. Admitted from CT surgery outpatient visit yesterday with left pleural effusion, left side pain and worsening shortness of breath. EXAM: CHEST  2 VIEW COMPARISON:  Chest CT 11/20/2017 and earlier. FINDINGS: Continued left lung base hydropneumothorax with increased component of non dependent gas since chest radiographs yesterday (arrows). Stable associated hypo ventilation at the left lung base and abnormal increased reticular interstitial opacity elsewhere in the left lung. Mediastinal contours are stable and within normal limits. The right lung remains clear. No acute osseous abnormality identified. IMPRESSION: 1. Continued left lower lung empyema with an increasing component of gas suspicious for infection by a gas-forming  organism. 2. Stable ventilation.  No new cardiopulmonary abnormality. Electronically Signed   By: Odessa Fleming M.D.   On: 11/21/2017 10:27   Dg Chest 2 View  Result Date: 11/20/2017 CLINICAL DATA:  Chest pain, shortness of breath. EXAM: CHEST  2 VIEW COMPARISON:  Radiographs of November 13, 2017. FINDINGS: The heart size and mediastinal contours are within normal limits. No pneumothorax is noted. Right lung is clear. Stable moderate left pleural effusion is noted with probable underlying atelectasis. Stable  left perihilar densities are noted concerning for inflammation or atelectasis. The visualized skeletal structures are unremarkable. IMPRESSION: Stable left lung densities and moderate left pleural effusion compared to prior exam. Electronically Signed   By: Lupita Raider, M.D.   On: 11/20/2017 11:38   Ct Chest W Contrast  Result Date: 11/20/2017 CLINICAL DATA:  Shortness of breath. EXAM: CT CHEST WITH CONTRAST TECHNIQUE: Multidetector CT imaging of the chest was performed during intravenous contrast administration. CONTRAST:  75mL ISOVUE-300 IOPAMIDOL (ISOVUE-300) INJECTION 61% COMPARISON:  11/07/2017 and 10/28/2017. FINDINGS: Cardiovascular: Heart size is normal. There is no pericardial effusion identified. Mediastinum/Nodes: The trachea appears patent and is midline. Normal appearance of the esophagus. Left hilar lymph node measures 1.3 cm, image 72 of series 3. Unchanged from previous exam. There is a subcarinal lymph node measuring 1.4 cm. Also unchanged from previous exam. Enlarging right hilar lymph node measures 1.5 cm, image 80 of series 3. Previously 0.8 cm. Large hypodense nodule within the right lobe of thyroid gland measures 4.7 cm. Lungs/Pleura: Small to moderate volume loculated pleural fluid collection is again identified. This contains multiple foci of gas concerning for empyema. Overlying airspace consolidation in atelectasis noted. When compared with study from 11/07/2017 this is stable to slightly decreased in volume from previous exam. Aeration to the left lower lobe appears improved in the interval. Upper Abdomen: No acute abnormality. Musculoskeletal: Multiple subacute to chronic left posterior rib fractures are identified. These appear similar to 10/28/17. chronic appearing right anterolateral rib fracture deformities are also similar to 10/28/17. IMPRESSION: 1. Left empyema is again identified. When compared with most recent previous exam from 11/07/2017 this is stable to slightly  decreased in volume in the interval. Overlying airspace consolidation within the left lower lobe is mildly improved in the interval. 2. Large nodule in right lobe of thyroid gland. Consider further evaluation with thyroid ultrasound. If patient is clinically hyperthyroid, consider nuclear medicine thyroid uptake and scan. 3. Similar appearance of mildly enlarged left hilar and subcarinal lymph nodes which may be reactive in etiology. Right hilar node has increased in size from previous exam Electronically Signed   By: Signa Kell M.D.   On: 11/20/2017 22:21   Dg Chest Port 1 View  Result Date: 11/22/2017 CLINICAL DATA:  47 year old male with history of recurrent left pleural effusion. EXAM: PORTABLE CHEST 1 VIEW COMPARISON:  Chest x-ray 11/21/2017. FINDINGS: Small bore left-sided chest tube in position with tip in the medial aspect of the left hemithorax. Moderate left pleural effusion is unchanged. Small amount of pleural gas noted on the prior study is no longer identified. Patchy multifocal interstitial and airspace disease throughout the left lung, similar to the prior study. Right lung is clear. No right pleural effusion. No evidence of pulmonary edema. Heart size is normal. Upper mediastinal contours are within normal limits. IMPRESSION: 1. Interval placement of small bore chest tube in pre-existing left empyema with resolution of previously noted gas in the collection, but no significant change in moderate volume of  fluid. 2. Patchy multifocal interstitial and airspace disease in the left lung concerning for bronchopneumonia. Electronically Signed   By: Trudie Reed M.D.   On: 11/22/2017 09:56   Ct Image Guided Drainage By Percutaneous Catheter  Result Date: 11/21/2017 INDICATION: 47 year old with history left lung VATS and decortication for empyema. Patient has a residual complex left pleural effusion. Request for image guided chest tube placement. EXAM: CT GUIDED PLACEMENT OF LEFT CHEST TUBE  MEDICATIONS: Sedation medications ANESTHESIA/SEDATION: 4.0 mg IV Versed 200 mcg IV Fentanyl Moderate Sedation Time:  40 minutes The patient was continuously monitored during the procedure by the interventional radiology nurse under my direct supervision. COMPLICATIONS: None immediate. TECHNIQUE: Informed written consent was obtained from the patient after a thorough discussion of the procedural risks, benefits and alternatives. All questions were addressed. Maximal Sterile Barrier Technique was utilized including mask, sterile gowns, sterile gloves, sterile drape, hand hygiene and skin antiseptic. A timeout was performed prior to the initiation of the procedure. PROCEDURE: Patient was placed prone on the CT scanner. Images through the lower chest were obtained. The residual complex fluid along the medial left lung base was targeted. The left side of the back was prepped with chlorhexidine and sterile field was created. Skin and soft tissues were anesthetized with 1% lidocaine. 18 gauge trocar needle was directed into the pleural space with CT guidance. Needle advancement through the pleural was very uncomfortable for the patient. Patient was complaining of pain radiating to the anterior abdomen. Stiff Amplatz wire was placed without complication. Tract was dilated with difficulty. The patient was complaining of "electrical" pain radiating to the anterior chest during dilatation. As a result, the patient could only tolerate placement of a 10.2 [place] drain. Approximately 5 mL of bloody fluid was removed. Catheter was sutured to skin and attached to a PleurEvac. Bandages placed over the catheter. Fluid was sent for culture. FINDINGS: Complex air-fluid collection along the medial left lower chest. Catheter successfully advanced within this complex pleural collection. Small amount of bloody fluid was removed. IMPRESSION: CT-guided placement of a chest tube within the small complex left pleural collection. Patient could  only tolerate placement of a small catheter (10 [place]). Small amount of bloody output from the drain. Patient may need intrapleural thrombolytics therapy. Electronically Signed   By: Richarda Overlie M.D.   On: 11/21/2017 16:44    Labs:  CBC: Recent Labs    11/08/17 0243 11/09/17 0239 11/20/17 1500 11/22/17 0437  WBC 16.8* 15.5* 10.8* 5.7  HGB 9.9* 9.4* 11.5* 9.8*  HCT 32.9* 31.0* 37.5* 32.9*  PLT 387 402* 384 312    COAGS: Recent Labs    10/28/17 0551 10/29/17 1916  INR 1.24 1.17  APTT 41* 44*    BMP: Recent Labs    11/04/17 0238 11/05/17 0845 11/08/17 0243 11/20/17 1500 11/22/17 0437  NA 136 133* 130*  --  138  K 3.8 4.2 4.2  --  3.8  CL 103 96* 95*  --  102  CO2 27 27 28   --  26  GLUCOSE 103* 110* 114*  --  133*  BUN 6 6 9   --  5*  CALCIUM 8.2* 8.6* 8.7*  --  8.7*  CREATININE 0.68 0.79 0.86 1.08 0.79  GFRNONAA >60 >60 >60 >60 >60  GFRAA >60 >60 >60 >60 >60    LIVER FUNCTION TESTS: Recent Labs    10/29/17 1916 11/01/17 0610  BILITOT 0.5 0.6  AST 15 27  ALT 24 20  ALKPHOS 70 66  PROT 5.5* 5.4*  ALBUMIN 1.7* 1.7*    Assessment and Plan: Empyema s/p chest tube placement by Dr. Lowella Dandy 1/19 Patient reports improvement today after drain placement.  Output is bloody fluid-50 mL out so far.  Care plan per [facility].  IR to follow.   Electronically Signed: Hoyt Koch, PA 11/22/2017, 12:23 PM   I spent a total of 15 Minutes at the the patient's bedside AND on the patient's hospital floor or unit, greater than 50% of which was counseling/coordinating care for empyema.

## 2017-11-23 ENCOUNTER — Inpatient Hospital Stay (HOSPITAL_COMMUNITY): Payer: BLUE CROSS/BLUE SHIELD

## 2017-11-23 DIAGNOSIS — Z978 Presence of other specified devices: Secondary | ICD-10-CM

## 2017-11-23 DIAGNOSIS — J869 Pyothorax without fistula: Principal | ICD-10-CM

## 2017-11-23 DIAGNOSIS — F17210 Nicotine dependence, cigarettes, uncomplicated: Secondary | ICD-10-CM

## 2017-11-23 DIAGNOSIS — B954 Other streptococcus as the cause of diseases classified elsewhere: Secondary | ICD-10-CM

## 2017-11-23 LAB — CULTURE, RESPIRATORY: CULTURE: NORMAL

## 2017-11-23 LAB — CULTURE, RESPIRATORY W GRAM STAIN: Special Requests: NORMAL

## 2017-11-23 MED ORDER — LEVALBUTEROL HCL 0.63 MG/3ML IN NEBU: 0.6300 mg | INHALATION_SOLUTION | Freq: Four times a day (QID) | RESPIRATORY_TRACT | Status: DC | PRN

## 2017-11-23 NOTE — Progress Notes (Signed)
Referring Physician(s): Dr Tyrone SageGerhardt  Supervising Physician: Ruel FavorsShick, Trevor  Patient Status:  [facility] - In-pt  Chief Complaint:  Left chest tube placed in IR 1/19  Subjective:  Empyema Post Vats Continued Left empyema Chest tube placed in IR 1/19  Feeling better daily Up in bed Eating reg diet Still with some "congestion"   Allergies: Patient has no known allergies.  Medications: Prior to Admission medications   Medication Sig Start Date End Date Taking? Authorizing Provider  amLODipine (NORVASC) 5 MG tablet Take 1 tablet (5 mg total) by mouth daily. 11/10/17  Yes Lonia BloodMcClung, Jeffrey T, MD  amoxicillin-clavulanate (AUGMENTIN) 875-125 MG tablet Take 1 tablet by mouth 2 (two) times daily. 11/09/17  Yes Lonia BloodMcClung, Jeffrey T, MD  atorvastatin (LIPITOR) 20 MG tablet Take 20 mg by mouth every morning.    Yes [provider]  citalopram (CELEXA) 20 MG tablet Take 20 mg by mouth daily.   Yes [provider]  methocarbamol (ROBAXIN) 750 MG tablet Take 1 tablet (750 mg total) by mouth every 6 (six) hours as needed for muscle spasms. 11/09/17  Yes Lonia BloodMcClung, Jeffrey T, MD  omeprazole (PRILOSEC) 20 MG capsule Take 20 mg by mouth 2 (two) times daily.   Yes [provider]  oxyCODONE-acetaminophen (PERCOCET/ROXICET) 5-325 MG tablet Take 1 tablet by mouth every 8 (eight) hours as needed for severe pain.    Yes [provider]  traMADol (ULTRAM) 50 MG tablet Take 1-2 tablets (50-100 mg total) by mouth every 6 (six) hours as needed for moderate pain. 11/09/17  Yes Lonia BloodMcClung, Jeffrey T, MD  traZODone (DESYREL) 50 MG tablet Take 50 mg by mouth at bedtime.   Yes [provider]  acetaminophen (TYLENOL) 500 MG tablet Take 1 tablet (500 mg total) by mouth every 6 (six) hours as needed for mild pain, fever or headache. Patient not taking: Reported on 11/20/2017 11/09/17   Lonia BloodMcClung, Jeffrey T, MD  oxyCODONE (OXY IR/ROXICODONE) 5 MG immediate release tablet Take 1-2 tablets  (5-10 mg total) by mouth every 3 (three) hours as needed for severe pain. Patient not taking: Reported on 11/20/2017 11/09/17   Lonia BloodMcClung, Jeffrey T, MD  senna (SENOKOT) 8.6 MG TABS tablet Take 1 tablet (8.6 mg total) by mouth at bedtime. Patient not taking: Reported on 11/20/2017 11/09/17   Lonia BloodMcClung, Jeffrey T, MD     Vital Signs: BP 122/76 (BP Location: Left Arm)   Pulse (!) 102   Temp 98.3 F (36.8 C) (Oral)   Resp 18   Ht 6\' 3"  (1.905 m)   Wt 258 lb (117 kg)   SpO2 94%   BMI 32.25 kg/m   Physical Exam  Constitutional: He is oriented to person, place, and time.  Cardiovascular: Normal rate.  Pulmonary/Chest: Effort normal. He has wheezes.  Abdominal: Soft.  Musculoskeletal: Normal range of motion.  Neurological: He is alert and oriented to person, place, and time.  Skin: Skin is warm and dry.  Site of L CT is clean and dry NT No bleeding No air leak at Pleurvac 60 cc serous fluid in Pleurvac    Psychiatric: He has a normal mood and affect.  Nursing note and vitals reviewed.  CXR: IMPRESSION: 1. Interval placement of small bore chest tube in pre-existing left empyema with resolution of previously noted gas in the collection, but no significant change in moderate volume of fluid. 2. Patchy multifocal interstitial and airspace disease in the left lung concerning for bronchopneumonia.   Imaging: Dg Chest 2 View  Result Date: 11/21/2017 CLINICAL DATA:  47 year old male status post left VATS for left chest empyema on 10/30/2017. Admitted from CT surgery outpatient visit yesterday with left pleural effusion, left side pain and worsening shortness of breath. EXAM: CHEST  2 VIEW COMPARISON:  Chest CT 11/20/2017 and earlier. FINDINGS: Continued left lung base hydropneumothorax with increased component of non dependent gas since chest radiographs yesterday (arrows). Stable associated hypo ventilation at the left lung base and abnormal increased reticular interstitial opacity elsewhere  in the left lung. Mediastinal contours are stable and within normal limits. The right lung remains clear. No acute osseous abnormality identified. IMPRESSION: 1. Continued left lower lung empyema with an increasing component of gas suspicious for infection by a gas-forming organism. 2. Stable ventilation.  No new cardiopulmonary abnormality. Electronically Signed   By: Odessa Fleming M.D.   On: 11/21/2017 10:27   Dg Chest 2 View  Result Date: 11/20/2017 CLINICAL DATA:  Chest pain, shortness of breath. EXAM: CHEST  2 VIEW COMPARISON:  Radiographs of November 13, 2017. FINDINGS: The heart size and mediastinal contours are within normal limits. No pneumothorax is noted. Right lung is clear. Stable moderate left pleural effusion is noted with probable underlying atelectasis. Stable left perihilar densities are noted concerning for inflammation or atelectasis. The visualized skeletal structures are unremarkable. IMPRESSION: Stable left lung densities and moderate left pleural effusion compared to prior exam. Electronically Signed   By: Lupita Raider, M.D.   On: 11/20/2017 11:38   Ct Chest W Contrast  Result Date: 11/20/2017 CLINICAL DATA:  Shortness of breath. EXAM: CT CHEST WITH CONTRAST TECHNIQUE: Multidetector CT imaging of the chest was performed during intravenous contrast administration. CONTRAST:  75mL ISOVUE-300 IOPAMIDOL (ISOVUE-300) INJECTION 61% COMPARISON:  11/07/2017 and 10/28/2017. FINDINGS: Cardiovascular: Heart size is normal. There is no pericardial effusion identified. Mediastinum/Nodes: The trachea appears patent and is midline. Normal appearance of the esophagus. Left hilar lymph node measures 1.3 cm, image 72 of series 3. Unchanged from previous exam. There is a subcarinal lymph node measuring 1.4 cm. Also unchanged from previous exam. Enlarging right hilar lymph node measures 1.5 cm, image 80 of series 3. Previously 0.8 cm. Large hypodense nodule within the right lobe of thyroid gland measures 4.7  cm. Lungs/Pleura: Small to moderate volume loculated pleural fluid collection is again identified. This contains multiple foci of gas concerning for empyema. Overlying airspace consolidation in atelectasis noted. When compared with study from 11/07/2017 this is stable to slightly decreased in volume from previous exam. Aeration to the left lower lobe appears improved in the interval. Upper Abdomen: No acute abnormality. Musculoskeletal: Multiple subacute to chronic left posterior rib fractures are identified. These appear similar to 10/28/17. chronic appearing right anterolateral rib fracture deformities are also similar to 10/28/17. IMPRESSION: 1. Left empyema is again identified. When compared with most recent previous exam from 11/07/2017 this is stable to slightly decreased in volume in the interval. Overlying airspace consolidation within the left lower lobe is mildly improved in the interval. 2. Large nodule in right lobe of thyroid gland. Consider further evaluation with thyroid ultrasound. If patient is clinically hyperthyroid, consider nuclear medicine thyroid uptake and scan. 3. Similar appearance of mildly enlarged left hilar and subcarinal lymph nodes which may be reactive in etiology. Right hilar node has increased in size from previous exam Electronically Signed   By: Signa Kell M.D.   On: 11/20/2017 22:21   Dg Chest Port 1 View  Result Date: 11/23/2017 CLINICAL DATA:  47 year old male  with history of recurrent left pleural effusion. EXAM: PORTABLE CHEST 1 VIEW COMPARISON:  Chest x-ray 11/22/2017. FINDINGS: Small bore left-sided chest tube remains in position with tip in the medial aspect of the mid left hemithorax. Persistent moderate left pleural effusion. Areas of atelectasis and/or consolidation are again noted at the left lung base with additional patchy interstitial airspace opacities throughout the left upper lung. Right lung is clear. No right pleural effusion. No pneumothorax. No  evidence of pulmonary edema. Heart size is normal. The patient is rotated to the left on today's exam, resulting in distortion of the mediastinal contours and reduced diagnostic sensitivity and specificity for mediastinal pathology. IMPRESSION: 1. Slight decreased size of what remains a moderate left pleural effusion. Otherwise, radiographic appearance the chest is essentially unchanged, as above. Electronically Signed   By: Trudie Reed M.D.   On: 11/23/2017 08:07   Dg Chest Port 1 View  Result Date: 11/22/2017 CLINICAL DATA:  47 year old male with history of recurrent left pleural effusion. EXAM: PORTABLE CHEST 1 VIEW COMPARISON:  Chest x-ray 11/21/2017. FINDINGS: Small bore left-sided chest tube in position with tip in the medial aspect of the left hemithorax. Moderate left pleural effusion is unchanged. Small amount of pleural gas noted on the prior study is no longer identified. Patchy multifocal interstitial and airspace disease throughout the left lung, similar to the prior study. Right lung is clear. No right pleural effusion. No evidence of pulmonary edema. Heart size is normal. Upper mediastinal contours are within normal limits. IMPRESSION: 1. Interval placement of small bore chest tube in pre-existing left empyema with resolution of previously noted gas in the collection, but no significant change in moderate volume of fluid. 2. Patchy multifocal interstitial and airspace disease in the left lung concerning for bronchopneumonia. Electronically Signed   By: Trudie Reed M.D.   On: 11/22/2017 09:56   Ct Image Guided Drainage By Percutaneous Catheter  Result Date: 11/21/2017 INDICATION: 47 year old with history left lung VATS and decortication for empyema. Patient has a residual complex left pleural effusion. Request for image guided chest tube placement. EXAM: CT GUIDED PLACEMENT OF LEFT CHEST TUBE MEDICATIONS: Sedation medications ANESTHESIA/SEDATION: 4.0 mg IV Versed 200 mcg IV Fentanyl  Moderate Sedation Time:  40 minutes The patient was continuously monitored during the procedure by the interventional radiology nurse under my direct supervision. COMPLICATIONS: None immediate. TECHNIQUE: Informed written consent was obtained from the patient after a thorough discussion of the procedural risks, benefits and alternatives. All questions were addressed. Maximal Sterile Barrier Technique was utilized including mask, sterile gowns, sterile gloves, sterile drape, hand hygiene and skin antiseptic. A timeout was performed prior to the initiation of the procedure. PROCEDURE: Patient was placed prone on the CT scanner. Images through the lower chest were obtained. The residual complex fluid along the medial left lung base was targeted. The left side of the back was prepped with chlorhexidine and sterile field was created. Skin and soft tissues were anesthetized with 1% lidocaine. 18 gauge trocar needle was directed into the pleural space with CT guidance. Needle advancement through the pleural was very uncomfortable for the patient. Patient was complaining of pain radiating to the anterior abdomen. Stiff Amplatz wire was placed without complication. Tract was dilated with difficulty. The patient was complaining of "electrical" pain radiating to the anterior chest during dilatation. As a result, the patient could only tolerate placement of a 10.2 [place] drain. Approximately 5 mL of bloody fluid was removed. Catheter was sutured to skin and attached to  a PleurEvac. Bandages placed over the catheter. Fluid was sent for culture. FINDINGS: Complex air-fluid collection along the medial left lower chest. Catheter successfully advanced within this complex pleural collection. Small amount of bloody fluid was removed. IMPRESSION: CT-guided placement of a chest tube within the small complex left pleural collection. Patient could only tolerate placement of a small catheter (10 [place]). Small amount of bloody output from  the drain. Patient may need intrapleural thrombolytics therapy. Electronically Signed   By: Richarda Overlie M.D.   On: 11/21/2017 16:44    Labs:  CBC: Recent Labs    11/08/17 0243 11/09/17 0239 11/20/17 1500 11/22/17 0437  WBC 16.8* 15.5* 10.8* 5.7  HGB 9.9* 9.4* 11.5* 9.8*  HCT 32.9* 31.0* 37.5* 32.9*  PLT 387 402* 384 312    COAGS: Recent Labs    10/28/17 0551 10/29/17 1916  INR 1.24 1.17  APTT 41* 44*    BMP: Recent Labs    11/04/17 0238 11/05/17 0845 11/08/17 0243 11/20/17 1500 11/22/17 0437  NA 136 133* 130*  --  138  K 3.8 4.2 4.2  --  3.8  CL 103 96* 95*  --  102  CO2 27 27 28   --  26  GLUCOSE 103* 110* 114*  --  133*  BUN 6 6 9   --  5*  CALCIUM 8.2* 8.6* 8.7*  --  8.7*  CREATININE 0.68 0.79 0.86 1.08 0.79  GFRNONAA >60 >60 >60 >60 >60  GFRAA >60 >60 >60 >60 >60    LIVER FUNCTION TESTS: Recent Labs    10/29/17 1916 11/01/17 0610  BILITOT 0.5 0.6  AST 15 27  ALT 24 20  ALKPHOS 70 66  PROT 5.5* 5.4*  ALBUMIN 1.7* 1.7*    Assessment and Plan:  L chest tube drain in place Intact No air leak Will follow Plan per Dr Tyrone Sage  Electronically Signed: Ralene Muskrat A, PA-C 11/23/2017, 11:55 AM   I spent a total of 15 Minutes at the the patient's bedside AND on the patient's hospital floor or unit, greater than 50% of which was counseling/coordinating care for L chest tube

## 2017-11-23 NOTE — Consult Note (Signed)
[facility]   Reason for Consult: Empyema     Referring Physician: Dr. Dorris Fetch   Active Problems:   Empyema lung (HCC)   . amLODipine  5 mg Oral Daily  . amoxicillin-clavulanate  1 tablet Oral BID WC  . atorvastatin  20 mg Oral Daily  . citalopram  20 mg Oral Daily  . enoxaparin (LOVENOX) injection  40 mg Subcutaneous Q24H  . levalbuterol  0.63 mg Nebulization TID  . pantoprazole  40 mg Oral Daily  . senna-docusate  1 tablet Oral QHS  . sodium chloride flush  3 mL Intravenous Q12H  . traMADol  50 mg Oral Q6H  . traZODone  50 mg Oral QHS    Recommendations: Continue Augmentin for additional 4 weeks. Repeat cultures negative thus far. Will continue to follow.   Assessment: 47 yo M admitted with a left lower lobe empyema s/p VATS, mini thoracotomy, and decortication on 12/28. Initially discharged but readmitted on 1/18 from CT surgery clinic follow up with worsening SOB. Pigtail drain was placed by IR on 1/19 with blood, non purulent drainage. Repeat cultures with no growth to date. Drain output has been minimal, 48 cc over the past 24 hours.   Antibiotics: 12/27 - 12/30: Vanc & Zosyn  12/30 - 1/7: IV Unasyn 1/7 PO Augmentin>>   Cultures: 1/19 Expectorated sputum >> normal respiratory flora  1/19 Empyema drainage >> NGTD 1/18 Blood culture >> NGTD   HPI: Elijah Gillespie is a 47 y.o. male with no significant pmhx who presented initially on 12/26 with cough, chest pain and SOB. In the ED he was afebrile but labs notable for wbc of 15.8. He was hypoxic, tachypnic, and tachycardic and placed on BiPAP. CTA was negative for PE but noted left lower lobe empyema. CT surgery was consulted and he underwent VATS, mini thoracotomy and decortication on 12/28. Cultures grew viridans strep and he was discharged on unasyn to complete a 14 day course. He followed up in CT surgery clinic on 1/18 with worsening SOB and was directly admitted. IR was consulted and  placed a 10 Fr drain with 5 mL of blood fluid removed.   Interval History: Afrebrile and hemodynamically stable. Persistent chest pain, reports improvement in his SOB. CXR today with slight decrease in size of moderate left pleural effusion. CT surgery considering thrombolytics.   Review of Systems: Review of Systems  Constitutional: Negative for chills and fever.  Respiratory: Negative for cough and shortness of breath.   Cardiovascular: Positive for chest pain.  Gastrointestinal: Negative for abdominal pain.  Musculoskeletal: Negative for back pain.  Neurological: Negative for headaches.  Psychiatric/Behavioral: Negative for depression.    All other systems reviewed and are negative    Past Medical History:  Diagnosis Date  . Depression with anxiety   . GERD (gastroesophageal reflux disease)   . Hyperlipidemia   . Hypertension   . Tobacco abuse     Social History   Tobacco Use  . Smoking status: Current Every Day Smoker    Packs/day: 1.00    Types: Cigarettes  . Smokeless tobacco: Never Used  Substance Use Topics  . Alcohol use: No    Alcohol/week: 4.8 oz    Types: 4 Cans of beer, 4 Shots of liquor per week    Frequency: Never    Comment: denies use  . Drug use: No    Family History  Problem Relation Age of Onset  . Diabetes Mellitus II Mother   .  Breast cancer Mother   . Diabetes Mellitus II Father     No Known Allergies  Physical Exam:  Vitals:   11/23/17 0248 11/23/17 0758  BP: 122/76   Pulse:    Resp: 18   Temp: 98.3 F (36.8 C)   SpO2: 96% 94%   Constitutional: NAD, appears comfortable HEENT: Atraumatic, normocephalic. PERRL, anicteric sclera.  Neck: Supple, trachea midline.  Cardiovascular: RRR, no murmurs, rubs, or gallops.  Pulmonary/Chest: CTAB, decreased breath sounds at left mid and lower lobes Abdominal: Soft, non tender, non distended. +BS.  Extremities: Warm and well perfused.  No edema.  Neurological: A&Ox3, CN II - XII grossly  intact.  Skin: No rashes or erythema  Psychiatric: Normal mood and affect  Lab Results  Component Value Date   WBC 5.7 11/22/2017   HGB 9.8 (L) 11/22/2017   HCT 32.9 (L) 11/22/2017   MCV 84.1 11/22/2017   PLT 312 11/22/2017    Lab Results  Component Value Date   CREATININE 0.79 11/22/2017   BUN 5 (L) 11/22/2017   NA 138 11/22/2017   K 3.8 11/22/2017   CL 102 11/22/2017   CO2 26 11/22/2017    Lab Results  Component Value Date   ALT 20 11/01/2017   AST 27 11/01/2017   ALKPHOS 66 11/01/2017     Microbiology: Recent Results (from the past 240 hour(s))  Culture, blood (routine x 2) Call MD if unable to obtain prior to antibiotics being given     Status: None (Preliminary result)   Collection Time: 11/20/17  3:05 PM  Result Value Ref Range Status   Specimen Description BLOOD LEFT ANTECUBITAL  Final   Special Requests   Final    BOTTLES DRAWN AEROBIC ONLY Blood Culture adequate volume   Culture NO GROWTH 2 DAYS  Final   Report Status PENDING  Incomplete  Culture, blood (routine x 2) Call MD if unable to obtain prior to antibiotics being given     Status: None (Preliminary result)   Collection Time: 11/20/17  3:15 PM  Result Value Ref Range Status   Specimen Description BLOOD LEFT HAND  Final   Special Requests   Final    BOTTLES DRAWN AEROBIC ONLY Blood Culture adequate volume   Culture NO GROWTH 2 DAYS  Final   Report Status PENDING  Incomplete  Culture, sputum-assessment     Status: None   Collection Time: 11/21/17  5:33 AM  Result Value Ref Range Status   Specimen Description EXPECTORATED SPUTUM  Final   Special Requests Normal  Final   Sputum evaluation THIS SPECIMEN IS ACCEPTABLE FOR SPUTUM CULTURE  Final   Report Status 11/21/2017 FINAL  Final  Culture, respiratory (NON-Expectorated)     Status: None (Preliminary result)   Collection Time: 11/21/17  5:33 AM  Result Value Ref Range Status   Specimen Description EXPECTORATED SPUTUM  Final   Special Requests  Normal Reflexed from [id number]  Final   Gram Stain   Final    ABUNDANT WBC PRESENT,BOTH PMN AND MONONUCLEAR RARE SQUAMOUS EPITHELIAL CELLS PRESENT MODERATE GRAM POSITIVE COCCI IN PAIRS FEW GRAM NEGATIVE COCCI IN PAIRS RARE GRAM POSITIVE RODS RARE GRAM NEGATIVE RODS    Culture CULTURE REINCUBATED FOR BETTER GROWTH  Final   Report Status PENDING  Incomplete  Aerobic/Anaerobic Culture (surgical/deep wound)     Status: None (Preliminary result)   Collection Time: 11/21/17  1:54 PM  Result Value Ref Range Status   Specimen Description LUNG EMPYEMA  Final  Special Requests Normal  Final   Gram Stain   Final    ABUNDANT WBC PRESENT,BOTH PMN AND MONONUCLEAR NO ORGANISMS SEEN    Culture NO GROWTH < 24 HOURS  Final   Report Status PENDING  Incomplete    Reymundo Poll, MD - PGY2  [email or web address] [phone] pager  [phone] cell 11/23/2017, 10:20 AM

## 2017-11-23 NOTE — Plan of Care (Signed)
  Progressing Education: Knowledge of General Education information will improve 11/23/2017 2258 - Progressing by Renelda MomWorley, Emeli Goguen L, RN Health Behavior/Discharge Planning: Ability to manage health-related needs will improve 11/23/2017 2258 - Progressing by Renelda MomWorley, Kiyanna Biegler L, RN

## 2017-11-23 NOTE — Progress Notes (Addendum)
[address]       [name] [id number]             [phone]         Subjective: Says he feels less SOB. Minimal drainage- no air leak  Objective: Vital signs in last 24 hours: Temp:  [98.3 F (36.8 C)-98.6 F (37 C)] 98.3 F (36.8 C) (01/21 0248) Pulse Rate:  [102] 102 (01/20 0934) Cardiac Rhythm: Sinus tachycardia (01/21 0700) Resp:  [15-19] 18 (01/21 0248) BP: (122-135)/(76-82) 122/76 (01/21 0248) SpO2:  [93 %-98 %] 94 % (01/21 0758)  Hemodynamic parameters for last 24 hours:    Intake/Output from previous day: 01/20 0701 - 01/21 0700 In: 720 [P.O.:720] Out: 843 [Urine:795; Chest Tube:48] Intake/Output this shift: No intake/output data recorded.  General appearance: alert, cooperative and no distress Heart: regular rate and rhythm Lungs: di in left base Abdomen: benign Extremities: no edema or calf tenderness Wound: incis healing well  Lab Results: Recent Labs    11/20/17 1500 11/22/17 0437  WBC 10.8* 5.7  HGB 11.5* 9.8*  HCT 37.5* 32.9*  PLT 384 312   BMET:  Recent Labs    11/20/17 1500 11/22/17 0437  NA  --  138  K  --  3.8  CL  --  102  CO2  --  26  GLUCOSE  --  133*  BUN  --  5*  CREATININE 1.08 0.79  CALCIUM  --  8.7*    PT/INR: No results for input(s): LABPROT, INR in the last 72 hours. ABG    Component Value Date/Time   PHART 7.453 (H) 10/31/2017 0431   HCO3 29.8 (H) 10/31/2017 0431   TCO2 31 10/31/2017 0431   O2SAT 98.0 10/31/2017 0431   CBG (last 3)  No results for input(s): GLUCAP in the last 72 hours.  Meds Scheduled Meds: . amLODipine  5 mg Oral Daily  . amoxicillin-clavulanate  1 tablet Oral BID WC  . atorvastatin  20 mg Oral Daily  . citalopram  20 mg Oral Daily  . enoxaparin (LOVENOX) injection  40 mg Subcutaneous Q24H  . levalbuterol  0.63 mg Nebulization TID  . pantoprazole  40 mg Oral Daily  . senna-docusate  1 tablet Oral QHS  . sodium chloride flush  3 mL Intravenous Q12H  . traMADol   50 mg Oral Q6H  . traZODone  50 mg Oral QHS   Continuous Infusions: . sodium chloride     PRN Meds:.sodium chloride, acetaminophen, methocarbamol, morphine injection, oxyCODONE, senna, sodium chloride flush  Xrays Dg Chest 2 View  Result Date: 11/21/2017 CLINICAL DATA:  47 year old male status post left VATS for left chest empyema on 10/30/2017. Admitted from CT surgery outpatient visit yesterday with left pleural effusion, left side pain and worsening shortness of breath. EXAM: CHEST  2 VIEW COMPARISON:  Chest CT 11/20/2017 and earlier. FINDINGS: Continued left lung base hydropneumothorax with increased component of non dependent gas since chest radiographs yesterday (arrows). Stable associated hypo ventilation at the left lung base and abnormal increased reticular interstitial opacity elsewhere in the left lung. Mediastinal contours are stable and within normal limits. The right lung remains clear. No acute osseous abnormality identified. IMPRESSION: 1. Continued left lower lung empyema with an increasing component of gas suspicious for infection by a gas-forming organism. 2. Stable ventilation.  No new cardiopulmonary abnormality. Electronically Signed   By: Odessa FlemingH  Hall M.D.   On: 11/21/2017 10:27   Dg Chest Port 1  View  Result Date: 11/23/2017 CLINICAL DATA:  47 year old male with history of recurrent left pleural effusion. EXAM: PORTABLE CHEST 1 VIEW COMPARISON:  Chest x-ray 11/22/2017. FINDINGS: Small bore left-sided chest tube remains in position with tip in the medial aspect of the mid left hemithorax. Persistent moderate left pleural effusion. Areas of atelectasis and/or consolidation are again noted at the left lung base with additional patchy interstitial airspace opacities throughout the left upper lung. Right lung is clear. No right pleural effusion. No pneumothorax. No evidence of pulmonary edema. Heart size is normal. The patient is rotated to the left on today's exam, resulting in  distortion of the mediastinal contours and reduced diagnostic sensitivity and specificity for mediastinal pathology. IMPRESSION: 1. Slight decreased size of what remains a moderate left pleural effusion. Otherwise, radiographic appearance the chest is essentially unchanged, as above. Electronically Signed   By: Trudie Reed M.D.   On: 11/23/2017 08:07   Dg Chest Port 1 View  Result Date: 11/22/2017 CLINICAL DATA:  47 year old male with history of recurrent left pleural effusion. EXAM: PORTABLE CHEST 1 VIEW COMPARISON:  Chest x-ray 11/21/2017. FINDINGS: Small bore left-sided chest tube in position with tip in the medial aspect of the left hemithorax. Moderate left pleural effusion is unchanged. Small amount of pleural gas noted on the prior study is no longer identified. Patchy multifocal interstitial and airspace disease throughout the left lung, similar to the prior study. Right lung is clear. No right pleural effusion. No evidence of pulmonary edema. Heart size is normal. Upper mediastinal contours are within normal limits. IMPRESSION: 1. Interval placement of small bore chest tube in pre-existing left empyema with resolution of previously noted gas in the collection, but no significant change in moderate volume of fluid. 2. Patchy multifocal interstitial and airspace disease in the left lung concerning for bronchopneumonia. Electronically Signed   By: Trudie Reed M.D.   On: 11/22/2017 09:56   Ct Image Guided Drainage By Percutaneous Catheter  Result Date: 11/21/2017 INDICATION: 47 year old with history left lung VATS and decortication for empyema. Patient has a residual complex left pleural effusion. Request for image guided chest tube placement. EXAM: CT GUIDED PLACEMENT OF LEFT CHEST TUBE MEDICATIONS: Sedation medications ANESTHESIA/SEDATION: 4.0 mg IV Versed 200 mcg IV Fentanyl Moderate Sedation Time:  40 minutes The patient was continuously monitored during the procedure by the interventional  radiology nurse under my direct supervision. COMPLICATIONS: None immediate. TECHNIQUE: Informed written consent was obtained from the patient after a thorough discussion of the procedural risks, benefits and alternatives. All questions were addressed. Maximal Sterile Barrier Technique was utilized including mask, sterile gowns, sterile gloves, sterile drape, hand hygiene and skin antiseptic. A timeout was performed prior to the initiation of the procedure. PROCEDURE: Patient was placed prone on the CT scanner. Images through the lower chest were obtained. The residual complex fluid along the medial left lung base was targeted. The left side of the back was prepped with chlorhexidine and sterile field was created. Skin and soft tissues were anesthetized with 1% lidocaine. 18 gauge trocar needle was directed into the pleural space with CT guidance. Needle advancement through the pleural was very uncomfortable for the patient. Patient was complaining of pain radiating to the anterior abdomen. Stiff Amplatz wire was placed without complication. Tract was dilated with difficulty. The patient was complaining of "electrical" pain radiating to the anterior chest during dilatation. As a result, the patient could only tolerate placement of a 10.2 [place] drain. Approximately 5 mL of bloody fluid  was removed. Catheter was sutured to skin and attached to a PleurEvac. Bandages placed over the catheter. Fluid was sent for culture. FINDINGS: Complex air-fluid collection along the medial left lower chest. Catheter successfully advanced within this complex pleural collection. Small amount of bloody fluid was removed. IMPRESSION: CT-guided placement of a chest tube within the small complex left pleural collection. Patient could only tolerate placement of a small catheter (10 [place]). Small amount of bloody output from the drain. Patient may need intrapleural thrombolytics therapy. Electronically Signed   By: Richarda Overlie M.D.   On:  11/21/2017 16:44    Assessment/Plan:  1 stable on current management 2 may need thrombolytics vs other tx vs cont same 3 conts augmentin 4 routine pulm toilet  LOS: 3 days    Rowe Clack 11/23/2017  Films reviewed , patient admitted last Friday by Dr Maren Beach , Drain placed by IR, very little has drained will plan to remove. ID to see  I have seen and examined Elijah Gillespie and agree with the above assessment  and plan.  [name] MD Beeper [phone] Office [phone] 11/23/2017 3:09 PM

## 2017-11-23 NOTE — H&P (Signed)
encounter: Office Visit from 11/20/2017 in [facility] [place]      Signed             Elijah Gillespie is a 47 y.o. male patient status post video bronchoscopy, left video-assisted thoracoscopic surgery with a mini thoracotomy and decortication by Dr. Tyrone Sage.  2 large Blake drains were placed with one straight chest tube.  On 10/30/2017.          Past Medical History:  Diagnosis Date  . Depression with anxiety   . GERD (gastroesophageal reflux disease)   . Hyperlipidemia   . Hypertension   . Tobacco abuse    No past surgical history pertinent negatives on file. Scheduled Meds:       Current Outpatient Medications on File Prior to Visit  Medication Sig Dispense Refill  . acetaminophen (TYLENOL) 500 MG tablet Take 1 tablet (500 mg total) by mouth every 6 (six) hours as needed for mild pain, fever or headache. 1 tablet 0  . amLODipine (NORVASC) 5 MG tablet Take 1 tablet (5 mg total) by mouth daily. 30 tablet 0  . amoxicillin-clavulanate (AUGMENTIN) 875-125 MG tablet Take 1 tablet by mouth 2 (two) times daily. 28 tablet 0  . atorvastatin (LIPITOR) 20 MG tablet Take 20 mg by mouth every morning.     . citalopram (CELEXA) 20 MG tablet Take 20 mg by mouth daily.    . methocarbamol (ROBAXIN) 750 MG tablet Take 1 tablet (750 mg total) by mouth every 6 (six) hours as needed for muscle spasms. 10 tablet 0  . omeprazole (PRILOSEC) 20 MG capsule Take 20 mg by mouth 2 (two) times daily.    Marland Kitchen oxyCODONE (OXY IR/ROXICODONE) 5 MG immediate release tablet Take 1-2 tablets (5-10 mg total) by mouth every 3 (three) hours as needed for severe pain. 30 tablet 0  . senna (SENOKOT) 8.6 MG TABS tablet Take 1 tablet (8.6 mg total) by mouth at bedtime. 1 each 0  . traMADol (ULTRAM) 50 MG tablet Take 1-2 tablets (50-100 mg total) by mouth every 6 (six) hours as needed for moderate pain. 30 tablet 0   No current facility-administered medications on file  prior to visit.      Blood pressure 117/75, pulse (!) 128, temperature 98.1 F (36.7 C), temperature source Oral, resp. rate 20, height 6\' 3"  (1.905 m), weight 258 lb (117 kg), SpO2 97 %.  Subjective patient presented today status post VATS with decortication for shortness of breath.  The patient states that he was in our office last Friday for suture removal appointment and was short of breath then as well.   Objective  Cor: sinus tachycardia Pulm: diffuse wheezing and rhonchi  Abd: no tenderness Ext: no edema Wound: c/d/i    CLINICAL DATA: Chest pain, shortness of breath.  EXAM: CHEST 2 VIEW  COMPARISON: Radiographs of November 13, 2017.  FINDINGS: The heart size and mediastinal contours are within normal limits. No pneumothorax is noted. Right lung is clear. Stable moderate left pleural effusion is noted with probable underlying atelectasis. Stable left perihilar densities are noted concerning for inflammation or atelectasis. The visualized skeletal structures are unremarkable.  IMPRESSION: Stable left lung densities and moderate left pleural effusion compared to prior exam.   Electronically Signed By: Elijah Gillespie, M.D. On: 11/20/2017 11:38  Assessment & Plan   He states that his shortness of breath has become worse since his hospital admission.  He has been placed on Augmentin for pneumonia which he has  been taking.  He feels like his sputum has changed to more of a clear to white compared to yellow when he was in the hospital.  The chest x-ray was reviewed with the patient and the wife at the bedside.  He is markedly more tachycardic than he had been while in the hospital with a heart rate in the 120s.  The patient feels short of breath even sitting in a stationary position.  Orthostatics were obtained and when in a lying position blood pressure was 115/75 with a pulse rate of 120, in a sitting position blood pressure was 127/81 with a pulse  rate of 124 and in a standing position blood pressure was 105/75 with a pulse rate of 130.  A rhythm strip which was obtained which showed sinus tachycardia.  I reviewed the chest x-ray with Dr. Maren BeachVantrigt who also saw the patient.  He suggested interventional radiology placing a pigtail catheter on the left side to drain the moderate pleural effusion since the patient was resistant to Lasix therapy and continues to be short of breath.  We have [facility].  We will order cultures of the fluid and he will continue his Augmentin during the admission.     Elijah Gillespie 11/20/2017

## 2017-11-24 ENCOUNTER — Inpatient Hospital Stay (HOSPITAL_COMMUNITY): Payer: BLUE CROSS/BLUE SHIELD

## 2017-11-24 MED ORDER — GUAIFENESIN ER 600 MG PO TB12
1200.0000 mg | ORAL_TABLET | Freq: Two times a day (BID) | ORAL | Status: DC
  Administered 2017-11-24 – 2017-11-26 (×5): 1200 mg via ORAL
  Filled 2017-11-24 (×5): qty 2

## 2017-11-24 NOTE — Discharge Summary (Signed)
Physician Discharge Summary  Patient ID: Elijah Gillespie MRN: [id number] DOB/AGE: June 08, 1971 47 y.o.  Admit date: 11/20/2017 Discharge date: 11/26/2017  Admission Diagnoses: Patient Active Problem List   Diagnosis Date Noted  . Empyema lung (HCC) 11/20/2017  . Empyema (HCC) 10/30/2017  . Thyroid nodule 10/28/2017  . Hypertension   . Hyperlipidemia   . GERD (gastroesophageal reflux disease)   . Tobacco abuse   . Pain in limb 07/20/2013  . Swelling of limb 07/20/2013  . Phlebitis and thrombophlebitis of lower extremities, unspecified 07/20/2013    Discharge Diagnoses:  Active Problems:   Empyema lung [facility])   Discharged Condition: good  HPI:  Elijah Gillespie is a 47 year old male who is status post video bronchoscopy, left VATS with mini thoracotomy and decortication by Dr. Tyrone Sage on 10/30/2017.  He presented to the office on Friday January 18 with an increasing amount shortness of breath which has become worse since his hospital admission.  At the time of admission he was placed on Augmentin for pneumonia which she had been taking daily.  He is markedly tachycardic on exam with heart rate in the 120s.  Orthostatics were obtained and were marginal.  Chest x-ray revealed a moderate left pleural effusion.  He was started on Lasix last week for similar complaints without any relief.  It was decided to admit the patient and get IR to do an ultrasound-guided pigtail insertion.  He also was started on nebulized treatments.  We also continued his Augmentin.    Hospital Course:  On 11/21/2017 Mr. Schertzer underwent a CT-guided placement of a 10 French drain and small complex left chest empyema.  IR removed 5 mL of bloody fluid from the collection.  They attached to Pleur-evac.  The fluid appeared to be very complex and may need thrombolytics to break up the material according to radiology.  The patient had a lot of pain with placement and after placement.  He has been tolerating oral pain  medication and occasionally needs morphine IV.  Patient remained stable postop day 1 from the procedure.  He drained 45 mL out of the left pleural space.  He was tolerating fluids therefore we discontinued his IV fluids.  The patient felt less short of breath with the tube placement.  Postop day 2 he remained stable on current management.  Continue his Augmentin.  We removed the pigtail catheter due to limited amount of drainage.  We consulted infectious disease for assistance.  Infectious disease recommended 4 weeks of antibiotic therapy for complete treatment regimen.  Postop day 3 he feels a little more short of breath now left chest tube is out.  He remains in sinus tachycardia with rate in the 110s.  His blood pressure is well controlled.  His renal function remains stable.  Chest x-ray shows no postprocedural pneumothorax.  Persistent small left pleural effusion with pleural thickening.  Further increase in interstitial opacities throughout the left lung since yesterday may reflect pneumonia or asymmetric pulmonary edema. We continued to ambulate the patient and work on incentive spirometry. We obtained a CT scan which was negative for PE. The patient overall is doing much better. He was considered stable to discharge home. He will follow-up with [place] in a week and with ID in a few weeks.   Consults: ID  Significant Diagnostic Studies:  CLINICAL DATA:  Shortness of breath. History of decortication on the left for pleural effusion in December 2018.  EXAM: PORTABLE CHEST 1 VIEW  COMPARISON:  Portable chest x-ray of November 23, 2017  FINDINGS: The small caliber left-sided chest tube has been removed. There remains a small amount of pleural fluid at the left lung base. The interstitial markings of the left lung are diffusely increased and more conspicuous today. There is no pneumothorax. On the right the lung is well-expanded and clear. There is no significant mediastinal shift. The heart and  pulmonary vascularity are within the limits of normal.  IMPRESSION: No postprocedure pneumothorax following small caliber left chest tube removal. Persistent small left pleural effusion-pleural thickening.  Further increase in the interstitial opacities throughout the left lung since yesterday may reflect pneumonia or asymmetric pulmonary edema.   Electronically Signed   By: David  SwazilandJordan M.D.   On: 11/24/2017 08:22    Treatments:  Pre-procedure Diagnoses  Empyema (HCC) [J86.9]  Post-procedure Diagnoses  Empyema (HCC) [J86.9]  Procedures  CT IMAGE GUIDED DRAINAGE BY PERCUTANEOUS CATHETER [[id number][[id number] (Custom)]        CT guided placement of 10 Fr drain in small complex left chest empyema.  Difficult to place a larger tube due to patient's pain and discomfort.  Removed 5 ml of bloody fluid from collection.  Attached to PleurEvac.  Minimal blood loss and no immediate complication.    Fluid appears to be very complex and may need thrombolytics to break up material.            Discharge Exam: Blood pressure 123/83, pulse 95, temperature 97.9 F (36.6 C), temperature source Oral, resp. rate (!) 22, height 6\' 3"  (1.905 m), weight 258 lb (117 kg), SpO2 100 %.    General appearance: alert, cooperative and no distress Heart: regular rate and rhythm, S1, S2 normal, no murmur, click, rub or gallop Lungs: clear to auscultation bilaterally Abdomen: soft, non-tender; bowel sounds normal; no masses,  no organomegaly Extremities: extremities normal, atraumatic, no cyanosis or edema Wound: clean and dry    Disposition: 01-Home or Self Care   Allergies as of 11/26/2017   No Known Allergies     Medication List    STOP taking these medications   oxyCODONE 5 MG immediate release tablet Commonly known as:  Oxy IR/ROXICODONE   traMADol 50 MG tablet Commonly known as:  ULTRAM     TAKE these medications   acetaminophen 500 MG tablet Commonly known as:  TYLENOL Take 1  tablet (500 mg total) by mouth every 6 (six) hours as needed for mild pain, fever or headache.   amLODipine 5 MG tablet Commonly known as:  NORVASC Take 1 tablet (5 mg total) by mouth daily.   amoxicillin-clavulanate 875-125 MG tablet Commonly known as:  AUGMENTIN Take 1 tablet by mouth 2 (two) times daily with a meal for 11 days. What changed:  when to take this   atorvastatin 20 MG tablet Commonly known as:  LIPITOR Take 20 mg by mouth every morning.   citalopram 20 MG tablet Commonly known as:  CELEXA Take 20 mg by mouth daily.   guaiFENesin 600 MG 12 hr tablet Commonly known as:  MUCINEX Take 2 tablets (1,200 mg total) by mouth 2 (two) times daily for 5 days.   methocarbamol 750 MG tablet Commonly known as:  ROBAXIN Take 1 tablet (750 mg total) by mouth every 6 (six) hours as needed for muscle spasms.   omeprazole 20 MG capsule Commonly known as:  PRILOSEC Take 20 mg by mouth 2 (two) times daily.   oxyCODONE-acetaminophen 5-325 MG tablet Commonly known as:  PERCOCET/ROXICET Take 1 tablet by mouth every 8 (eight)  hours as needed for severe pain.   senna 8.6 MG Tabs tablet Commonly known as:  SENOKOT Take 1 tablet (8.6 mg total) by mouth at bedtime.   traZODone 50 MG tablet Commonly known as:  DESYREL Take 50 mg by mouth at bedtime.      Follow-up Information    [name], MD Follow up.   Specialty:  Cardiothoracic Surgery Why:  Your appointment is on January 31st at 4:00pm. Please arrive at 3:30pm for a chest xray located at [facility] which is on the first floor of our building.  Contact information: [address] [place] [address] [phone]        Patria Mane, MD. Call in 1 day(s).   Specialty:  Internal Medicine Contact information: [address] [facility] [address] [phone]        Gardiner Barefoot, MD Follow up.   Specialty:  Infectious Diseases Why:   Your appointment is on 12/15/2017 at 3:30pm. Please bring your medication list. Contact information: [address] [place] [address] [phone]           Signed: Sharlene Dory 11/26/2017, 9:04 AM

## 2017-11-24 NOTE — Progress Notes (Addendum)
      [address]       Elijah Gillespie,Elijah Gillespie             [phone]         Subjective: Feels like he is getting short of breath again but not as bad as when he was in the office.   Objective: Vital signs in last 24 hours: Temp:  [98.4 F (36.9 C)-98.7 F (37.1 C)] 98.4 F (36.9 C) (01/22 0347) Pulse Rate:  [80-91] 80 (01/22 0347) Cardiac Rhythm: Normal sinus rhythm (01/22 0700) Resp:  [14-19] 14 (01/22 0347) BP: (116-133)/(80-89) 116/89 (01/22 0347) SpO2:  [92 %-100 %] 100 % (01/22 0347)     Intake/Output from previous day: No intake/output data recorded. Intake/Output this shift: No intake/output data recorded.  General appearance: alert, cooperative and no distress Heart: sinus tachycardia Lungs: clear to auscultation bilaterally Abdomen: soft, non-tender; bowel sounds normal; no masses,  no organomegaly Extremities: extremities normal, atraumatic, no cyanosis or edema Wound: clean and dry  Lab Results: Recent Labs    11/22/17 0437  WBC 5.7  HGB 9.8*  HCT 32.9*  PLT 312   BMET:  Recent Labs    11/22/17 0437  NA 138  K 3.8  CL 102  CO2 26  GLUCOSE 133*  BUN 5*  CREATININE 0.79  CALCIUM 8.7*    PT/INR: No results for input(s): LABPROT, INR in the last 72 hours. ABG    Component Value Date/Time   PHART 7.453 (H) 10/31/2017 0431   HCO3 29.8 (H) 10/31/2017 0431   TCO2 31 10/31/2017 0431   O2SAT 98.0 10/31/2017 0431   CBG (last 3)  No results for input(s): GLUCAP in the last 72 hours.  Assessment/Plan: S/P chest tube placement for left pleural effusion.  1. CV- sinus tach in the 110s. BP well controlled. Taking Norvasc and Lipitor.  2. Pulm- chest tube in place. CXR yesterday shows slight decrease in size of what remains of a moderate left pleural effusion. Otherwise, unchanged. Chest tube removed. Continue xopenex nebs 3. Renal-creatinine 0.79, electrolytes okay 4. H and H 9.8/32.9 5. ID recommended 4 weeks of Augmentin.  Repeat cultures negative thus far. Appreciate the assistance  Plan: Await CXR from this morning. Continue Augmentin. Start mucinex for secretions. Continue to use incentive spirometer and ambulate.     LOS: 4 days    Elijah Gillespie 11/24/2017  Pa and lat chest xray in am Chest out yesterday  ID has seen - continue antibiotics  for strep  I have seen and examined Elijah Gillespie and agree with the above assessment  and plan.  Delight OvensEdward B Elijah Zendejas MD Beeper [phone] Office [phone] 11/24/2017 10:38 AM

## 2017-11-24 NOTE — Progress Notes (Signed)
Patient requesting dressing to chest tube site to be changed. This was done dressing clean dry intact will montior patient. Adisa Vigeant, Randall AnKristin Jessup RN

## 2017-11-24 NOTE — Consult Note (Signed)
[facility]   Reason for Consult: Empyema     Referring Physician: Dr. Dorris Fetch   Active Problems:   Empyema lung (HCC)   . amLODipine  5 mg Oral Daily  . amoxicillin-clavulanate  1 tablet Oral BID WC  . atorvastatin  20 mg Oral Daily  . citalopram  20 mg Oral Daily  . enoxaparin (LOVENOX) injection  40 mg Subcutaneous Q24H  . guaiFENesin  1,200 mg Oral BID  . pantoprazole  40 mg Oral Daily  . senna-docusate  1 tablet Oral QHS  . sodium chloride flush  3 mL Intravenous Q12H  . traMADol  50 mg Oral Q6H  . traZODone  50 mg Oral QHS    Recommendations: Continue Augmentin for 4 weeks. Repeat cultures negative thus far. Will sign off. Dr. Luciana Axe to arrange outpatient ID follow up.    Assessment: 47 yo M admitted with a left lower lobe empyema s/p VATS, mini thoracotomy, and decortication on 12/28. Initially discharged but readmitted on 1/18 from CT surgery clinic follow up with worsening SOB. Pigtail drain was placed by IR on 1/19 with bloody, non purulent drainage. Repeat cultures with no growth to date. Chest tube removed yesterday, tolerated well.   Antibiotics: 12/27 - 12/30: Vanc & Zosyn  12/30 - 1/7: IV Unasyn 1/7 PO Augmentin>>   Cultures: 1/19 Expectorated sputum >> normal respiratory flora  1/19 Empyema drainage >> NGTD 1/18 Blood culture >> NGTD   HPI: Elijah Gillespie is a 47 y.o. male with no significant pmhx who presented initially on 12/26 with cough, chest pain and SOB. In the ED he was afebrile but labs notable for wbc of 15.8. He was hypoxic, tachypnic, and tachycardic and placed on BiPAP. CTA was negative for PE but noted left lower lobe empyema. CT surgery was consulted and he underwent VATS, mini thoracotomy and decortication on 12/28. Cultures grew viridans strep and he was discharged on unasyn to complete a 14 day course. He followed up in CT surgery clinic on 1/18 with worsening SOB and was directly admitted. IR was consulted  and placed a 10 Fr drain with 5 mL of blood fluid removed.   Interval History: Afrebrile and hemodynamically stable. Chest tube removed yesterday, tolerated well.    Review of Systems: Review of Systems  Constitutional: Negative for chills and fever.  Respiratory: Negative for cough and shortness of breath.   Cardiovascular: Positive for chest pain.  Gastrointestinal: Negative for abdominal pain.  Musculoskeletal: Negative for back pain.  Neurological: Negative for headaches.  Psychiatric/Behavioral: Negative for depression.    All other systems reviewed and are negative    Past Medical History:  Diagnosis Date  . Depression with anxiety   . GERD (gastroesophageal reflux disease)   . Hyperlipidemia   . Hypertension   . Tobacco abuse     Social History   Tobacco Use  . Smoking status: Current Every Day Smoker    Packs/day: 1.00    Types: Cigarettes  . Smokeless tobacco: Never Used  Substance Use Topics  . Alcohol use: No    Alcohol/week: 4.8 oz    Types: 4 Cans of beer, 4 Shots of liquor per week    Frequency: Never    Comment: denies use  . Drug use: No    Family History  Problem Relation Age of Onset  . Diabetes Mellitus II Mother   . Breast cancer Mother   . Diabetes Mellitus II Father     No Known  Allergies  Physical Exam:  Vitals:   11/24/17 0347 11/24/17 1034  BP: 116/89 124/81  Pulse: 80   Resp: 14   Temp: 98.4 F (36.9 C)   SpO2: 100%    Constitutional: NAD, appears comfortable HEENT: Atraumatic, normocephalic. PERRL, anicteric sclera.  Neck: Supple, trachea midline.  Cardiovascular: RRR, no murmurs, rubs, or gallops.  Pulmonary/Chest: CTAB, decreased breath sounds at left mid and lower lobes Abdominal: Soft, non tender, non distended. +BS.  Extremities: Warm and well perfused.  No edema.  Neurological: A&Ox3, CN II - XII grossly intact.  Skin: No rashes or erythema  Psychiatric: Normal mood and affect  Lab Results  Component Value Date    WBC 5.7 11/22/2017   HGB 9.8 (L) 11/22/2017   HCT 32.9 (L) 11/22/2017   MCV 84.1 11/22/2017   PLT 312 11/22/2017    Lab Results  Component Value Date   CREATININE 0.79 11/22/2017   BUN 5 (L) 11/22/2017   NA 138 11/22/2017   K 3.8 11/22/2017   CL 102 11/22/2017   CO2 26 11/22/2017    Lab Results  Component Value Date   ALT 20 11/01/2017   AST 27 11/01/2017   ALKPHOS 66 11/01/2017     Microbiology: Recent Results (from the past 240 hour(s))  Culture, blood (routine x 2) Call MD if unable to obtain prior to antibiotics being given     Status: None (Preliminary result)   Collection Time: 11/20/17  3:05 PM  Result Value Ref Range Status   Specimen Description BLOOD LEFT ANTECUBITAL  Final   Special Requests   Final    BOTTLES DRAWN AEROBIC ONLY Blood Culture adequate volume   Culture NO GROWTH 4 DAYS  Final   Report Status PENDING  Incomplete  Culture, blood (routine x 2) Call MD if unable to obtain prior to antibiotics being given     Status: None (Preliminary result)   Collection Time: 11/20/17  3:15 PM  Result Value Ref Range Status   Specimen Description BLOOD LEFT HAND  Final   Special Requests   Final    BOTTLES DRAWN AEROBIC ONLY Blood Culture adequate volume   Culture NO GROWTH 4 DAYS  Final   Report Status PENDING  Incomplete  Culture, sputum-assessment     Status: None   Collection Time: 11/21/17  5:33 AM  Result Value Ref Range Status   Specimen Description EXPECTORATED SPUTUM  Final   Special Requests Normal  Final   Sputum evaluation THIS SPECIMEN IS ACCEPTABLE FOR SPUTUM CULTURE  Final   Report Status 11/21/2017 FINAL  Final  Culture, respiratory (NON-Expectorated)     Status: None   Collection Time: 11/21/17  5:33 AM  Result Value Ref Range Status   Specimen Description EXPECTORATED SPUTUM  Final   Special Requests Normal Reflexed from [id number]  Final   Gram Stain   Final    ABUNDANT WBC PRESENT,BOTH PMN AND MONONUCLEAR RARE SQUAMOUS EPITHELIAL CELLS  PRESENT MODERATE GRAM POSITIVE COCCI IN PAIRS FEW GRAM NEGATIVE COCCI IN PAIRS RARE GRAM POSITIVE RODS RARE GRAM NEGATIVE RODS    Culture Consistent with normal respiratory flora.  Final   Report Status 11/23/2017 FINAL  Final  Aerobic/Anaerobic Culture (surgical/deep wound)     Status: None (Preliminary result)   Collection Time: 11/21/17  1:54 PM  Result Value Ref Range Status   Specimen Description LUNG EMPYEMA  Final   Special Requests Normal  Final   Gram Stain   Final    ABUNDANT WBC  PRESENT,BOTH PMN AND MONONUCLEAR NO ORGANISMS SEEN    Culture   Final    NO GROWTH 3 DAYS NO ANAEROBES ISOLATED; CULTURE IN PROGRESS FOR 5 DAYS   Report Status PENDING  Incomplete    Reymundo Poll, MD - PGY2  [facility] [email or web address] [phone] pager  [phone] cell 11/24/2017, 12:01 PM

## 2017-11-24 NOTE — Progress Notes (Signed)
Patient ambulated in hallway independently. Rebel Laughridge Jessup RN  

## 2017-11-25 ENCOUNTER — Inpatient Hospital Stay (HOSPITAL_COMMUNITY): Payer: BLUE CROSS/BLUE SHIELD

## 2017-11-25 ENCOUNTER — Encounter (HOSPITAL_COMMUNITY): Payer: Self-pay | Admitting: Radiology

## 2017-11-25 LAB — CULTURE, BLOOD (ROUTINE X 2)
Culture: NO GROWTH
Culture: NO GROWTH
SPECIAL REQUESTS: ADEQUATE
Special Requests: ADEQUATE

## 2017-11-25 LAB — CREATININE, SERUM
Creatinine, Ser: 0.74 mg/dL (ref 0.61–1.24)
GFR calc Af Amer: >60 mL/min (ref >60)
GFR calc non Af Amer: >60 mL/min (ref >60)

## 2017-11-25 LAB — BUN: BUN: 10 mg/dL (ref 6–20)

## 2017-11-25 MED ORDER — OXYCODONE HCL 5 MG PO TABS
10.0000 mg | ORAL_TABLET | ORAL | Status: DC | PRN
  Administered 2017-11-25 – 2017-11-26 (×6): 10 mg via ORAL
  Filled 2017-11-25 (×6): qty 2

## 2017-11-25 MED ORDER — IOPAMIDOL (ISOVUE-370) INJECTION 76%
INTRAVENOUS | Status: AC
  Administered 2017-11-25: 100 mL
  Filled 2017-11-25: qty 100

## 2017-11-25 NOTE — Progress Notes (Addendum)
      [address]       [name] [id number]             [phone]         Subjective: States that he has been in pain since last night. He takes percocet at home for chronic pain and knows he has built up a tolerance.   Objective: Vital signs in last 24 hours: Temp:  [98.1 F (36.7 C)-98.4 F (36.9 C)] 98.4 F (36.9 C) (01/23 0523) Pulse Rate:  [88-109] 88 (01/23 0523) Cardiac Rhythm: Sinus tachycardia (01/22 2000) Resp:  [15-25] 17 (01/23 0523) BP: (124-130)/(72-81) 125/80 (01/23 0523) SpO2:  [95 %-96 %] 96 % (01/23 0523)     Intake/Output from previous day: 01/22 0701 - 01/23 0700 In: 1080 [P.O.:1080] Out: -  Intake/Output this shift: No intake/output data recorded.  General appearance: alert, cooperative and no distress Heart: sinus tachycardia Lungs: clear to auscultation bilaterally Abdomen: soft, non-tender; bowel sounds normal; no masses,  no organomegaly Extremities: extremities normal, atraumatic, no cyanosis or edema Wound: clean and dry  Lab Results: No results for input(s): WBC, HGB, HCT, PLT in the last 72 hours. BMET: No results for input(s): NA, K, CL, CO2, GLUCOSE, BUN, CREATININE, CALCIUM in the last 72 hours.  PT/INR: No results for input(s): LABPROT, INR in the last 72 hours. ABG    Component Value Date/Time   PHART 7.453 (H) 10/31/2017 0431   HCO3 29.8 (H) 10/31/2017 0431   TCO2 31 10/31/2017 0431   O2SAT 98.0 10/31/2017 0431   CBG (last 3)  No results for input(s): GLUCAP in the last 72 hours.  Assessment/Plan: S/P  chest tube placement for left pleural effusion.  1. CV- sinus tach in the 100s. BP well controlled. Taking Norvasc and Lipitor.  2. Pulm- chest tube in place. CXR yesterday shows no postprocedural pneumothorax, persistent small left pleural effusion-pleural thickening. Further increase in interstitial opacities throughout the left lung which may reflect pneumonia or asymmetric pulmonary edema. Chest tube  removed. Continue xopenex nebs 3. Renal-creatinine 0.79, electrolytes okay 4. H and H 9.8/32.9 5. ID recommended 4 weeks of Augmentin. Repeat cultures negative thus far. Appreciate the assistance 6. Pain is not well controlled on current regimen. Changed oxycodone to q3 hours. He may just need to go back on his home dose of percocet.   Plan: Await PA/lateral film. Continue Augmentin.  Continue to use incentive spirometer and ambulate.      LOS: 5 days    Elijah Gillespie 11/25/2017   Dg Chest 2 View  Result Date: 11/25/2017 CLINICAL DATA:  Follow-up chest tube removal EXAM: CHEST  2 VIEW COMPARISON:  11/24/2017 FINDINGS: Cardiac shadow is stable. Right lung is hyperinflated but clear. Persistent left basilar effusion is seen. No new focal abnormality is noted. IMPRESSION: Persistent left basilar effusion and likely underlying infiltrate. No pneumothorax is noted. Electronically Signed   By: Alcide CleverMark  Lukens M.D.   On: 11/25/2017 08:57    Before d/c home will repeat ct of chest , to r/o pe and evaluate effusion / infiltrate  I have seen and examined Elijah Gillespie and agree with the above assessment  and plan.  Delight OvensEdward B Khalila Buechner MD [place] [phone] Office [phone] 11/25/2017 3:05 PM

## 2017-11-25 NOTE — Progress Notes (Signed)
No new growth from the cultures, blood.  CXR noted.  No changes.  4 weeks of augmentin.  I will arrange follow up in 2-3 weeks.   Thanks Gardiner Barefootobert W Maahir Horst, MD

## 2017-11-26 LAB — AEROBIC/ANAEROBIC CULTURE W GRAM STAIN (SURGICAL/DEEP WOUND)
Culture: NO GROWTH
Special Requests: NORMAL

## 2017-11-26 MED ORDER — GUAIFENESIN ER 600 MG PO TB12: 1200.0000 mg | ORAL_TABLET | Freq: Two times a day (BID) | ORAL | 0 refills | Status: AC

## 2017-11-26 MED ORDER — AMOXICILLIN-POT CLAVULANATE 875-125 MG PO TABS: 1.0000 | ORAL_TABLET | Freq: Two times a day (BID) | ORAL | 0 refills | Status: AC

## 2017-11-26 MED ORDER — AMOXICILLIN-POT CLAVULANATE 875-125 MG PO TABS: 1.0000 | ORAL_TABLET | Freq: Two times a day (BID) | ORAL | 0 refills | Status: DC

## 2017-11-26 NOTE — Progress Notes (Addendum)
      [address]       [place] [id number]             [phone]         Subjective: Still continues to feel some "constriction" when he takes a deep breath.   Objective: Vital signs in last 24 hours: Temp:  [97.9 F (36.6 C)-98.4 F (36.9 C)] 97.9 F (36.6 C) (01/24 0436) Pulse Rate:  [84-95] 95 (01/24 0436) Cardiac Rhythm: Normal sinus rhythm (01/24 0700) Resp:  [15-22] 22 (01/24 0436) BP: (123-130)/(80-84) 123/83 (01/24 0436) SpO2:  [95 %-100 %] 100 % (01/24 0436)      Intake/Output from previous day: 01/23 0701 - 01/24 0700 In: 3 [I.V.:3] Out: -  Intake/Output this shift: No intake/output data recorded.  General appearance: alert, cooperative and no distress Heart: regular rate and rhythm, S1, S2 normal, no murmur, click, rub or gallop Lungs: clear to auscultation bilaterally Abdomen: soft, non-tender; bowel sounds normal; no masses,  no organomegaly Extremities: extremities normal, atraumatic, no cyanosis or edema Wound: clean and dry  Lab Results: No results for input(s): WBC, HGB, HCT, PLT in the last 72 hours. BMET:  Recent Labs    11/25/17 1600  BUN 10  CREATININE 0.74    PT/INR: No results for input(s): LABPROT, INR in the last 72 hours. ABG    Component Value Date/Time   PHART 7.453 (H) 10/31/2017 0431   HCO3 29.8 (H) 10/31/2017 0431   TCO2 31 10/31/2017 0431   O2SAT 98.0 10/31/2017 0431   CBG (last 3)  No results for input(s): GLUCAP in the last 72 hours.  Assessment/Plan: S/P  chest tube placement for left pleural effusion.  1. CV- NSR in the 80s-90s. BP well controlled. Taking Norvasc and Lipitor.  2. Pulm- chest tube in place. Chest CT listed below. Continue xopenex nebs 3. Renal-creatinine 0.79, electrolytes okay, no new labs 4. H and H 9.8/32.9, no new labs 5. ID recommended 4 weeks of Augmentin. No new culture growth. Appreciate the assistance 6. Pain is better controlled this morning. Oxycodone q3 hours.    Chest CT: IMPRESSION: 1. No acute pulmonary embolism. 2. Similar size and appearance of LEFT lung base empyema with gas. Similar to increasing contiguous pneumonia. 3. Bronchial wall thickening seen with reactive airway disease or bronchitis. 4. Similar to decreased mediastinal and hilar reactive lymphadenopathy. 5. **An incidental finding of potential clinical significance has been found. 4.4 cm RIGHT thyroid nodule. Recommend follow-up thyroid sonogram on nonemergent basis. This follows ACR consensus guidelines: Managing Incidental Thyroid Nodules Detected on Imaging: White Paper of the ACR Incidental Thyroid Findings Committee. J Am Coll Radiol 2015; 12:143-150.**  Plan: Possible discharge today if okay with Elijah Gillespie. Will need follow-up in our office as well as with ID.    LOS: 6 days    Elijah Gillespie 11/26/2017  CT scan reviewed with the patient, will continue with current treatment no need for further surgical treatment at this point. Patient already has an appointment with his primary care doctor to workup the right thyroid nodule. We will plan going home today with 4 weeks of Augmentin Follow-up chest x-ray in the office in approximately 2 weeks. I have seen and examined Elijah Gillespie and agree with the above assessment  and plan.  Elijah OvensEdward B Elijah Falconi MD Elijah Gillespie 11/26/2017 8:52 AM

## 2017-11-30 ENCOUNTER — Ambulatory Visit: Payer: Self-pay

## 2017-12-01 LAB — FUNGUS CULTURE RESULT

## 2017-12-01 LAB — FUNGUS CULTURE WITH STAIN

## 2017-12-01 LAB — FUNGAL ORGANISM REFLEX

## 2017-12-02 ENCOUNTER — Other Ambulatory Visit: Payer: Self-pay | Admitting: *Deleted

## 2017-12-02 DIAGNOSIS — J869 Pyothorax without fistula: Secondary | ICD-10-CM

## 2017-12-03 ENCOUNTER — Ambulatory Visit (INDEPENDENT_AMBULATORY_CARE_PROVIDER_SITE_OTHER): Payer: Self-pay | Admitting: Cardiothoracic Surgery

## 2017-12-03 ENCOUNTER — Encounter: Payer: Self-pay | Admitting: Cardiothoracic Surgery

## 2017-12-03 ENCOUNTER — Other Ambulatory Visit: Payer: Self-pay

## 2017-12-03 ENCOUNTER — Ambulatory Visit
Admission: RE | Admit: 2017-12-03 | Discharge: 2017-12-03 | Disposition: A | Discharge status: Home / Self Care | Payer: BLUE CROSS/BLUE SHIELD | Source: Ambulatory Visit | Attending: Cardiothoracic Surgery | Admitting: Cardiothoracic Surgery

## 2017-12-03 VITALS — BP 130/89 | HR 105 | Resp 18 | Ht 75.0 in | Wt 261.8 lb

## 2017-12-03 DIAGNOSIS — J90 Pleural effusion, not elsewhere classified: Secondary | ICD-10-CM

## 2017-12-03 DIAGNOSIS — J869 Pyothorax without fistula: Secondary | ICD-10-CM

## 2017-12-03 DIAGNOSIS — Z09 Encounter for follow-up examination after completed treatment for conditions other than malignant neoplasm: Secondary | ICD-10-CM

## 2017-12-03 NOTE — Progress Notes (Signed)
[address]       [place] [address]             [phone]      Elijah Gillespie [facility] Medical Record #[id number] Date of Birth: 1971-03-27  Referring: Alyson Reedy, MD Primary Care: Patria Mane, MD Primary Cardiologist: No primary care provider on file.   Chief Complaint:   POST OP FOLLOW UP 10/30/2017  OPERATIVE REPORT PREOPERATIVE DIAGNOSIS:  Empyema, left chest. POSTOPERATIVE DIAGNOSIS: Empyema, left chest. PROCEDURE:  Bronchoscopy, left video-assisted thoracoscopy, mini thoracotomy, decortication. SURGEON:  Sheliah Plane, Md  History of Present Illness:     Patient returns to the office today after recent readmission following drainage of an empyema December 28.  Overall the patient feels better than he has in several weeks.  Denies any fever or chills, continues on p.o. Antibiotics.  Does have left lower flank incisional pain but this is improving.     Past Medical History:  Diagnosis Date  . Depression with anxiety   . GERD (gastroesophageal reflux disease)   . Hyperlipidemia   . Hypertension   . Tobacco abuse      Social History   Tobacco Use  Smoking Status Current Every Day Smoker  . Packs/day: 1.00  . Types: Cigarettes  Smokeless Tobacco Never Used    Social History   Substance and Sexual Activity  Alcohol Use No  . Alcohol/week: 4.8 oz  . Types: 4 Cans of beer, 4 Shots of liquor per week  . Frequency: Never   Comment: denies use     No Known Allergies  Current Outpatient Medications  Medication Sig Dispense Refill  . acetaminophen (TYLENOL) 500 MG tablet Take 1 tablet (500 mg total) by mouth every 6 (six) hours as needed for mild pain, fever or headache. 1 tablet 0  . amLODipine (NORVASC) 5 MG tablet Take 1 tablet (5 mg total) by mouth daily. 30 tablet 0  . amoxicillin-clavulanate (AUGMENTIN) 875-125 MG tablet Take 1 tablet by mouth 2 (two) times daily with a meal for 11 days. 22 tablet 0  .  atorvastatin (LIPITOR) 20 MG tablet Take 20 mg by mouth every morning.     . citalopram (CELEXA) 20 MG tablet Take 20 mg by mouth daily.    Marland Kitchen omeprazole (PRILOSEC) 20 MG capsule Take 20 mg by mouth 2 (two) times daily.    Marland Kitchen oxyCODONE-acetaminophen (PERCOCET/ROXICET) 5-325 MG tablet Take 1 tablet by mouth every 8 (eight) hours as needed for severe pain.     Marland Kitchen senna (SENOKOT) 8.6 MG TABS tablet Take 1 tablet (8.6 mg total) by mouth at bedtime. 1 each 0  . traZODone (DESYREL) 50 MG tablet Take 50 mg by mouth at bedtime.    . methocarbamol (ROBAXIN) 750 MG tablet Take 1 tablet (750 mg total) by mouth every 6 (six) hours as needed for muscle spasms. (Patient not taking: Reported on 12/03/2017) 10 tablet 0   No current facility-administered medications for this visit.        Physical Exam: BP 130/89 (BP Location: Right Arm, Patient Position: Sitting, Cuff Size: Large)   Pulse (!) 105   Resp 18   Ht 6\' 3"  (1.905 m)   Wt 261 lb 12.8 oz (118.8 kg)   SpO2 97% Comment: RA  BMI 32.72 kg/m   General appearance: alert, cooperative and no distress Neurologic: intact Heart: regular rate and rhythm, S1, S2 normal, no murmur, click, rub or gallop Lungs: clear to auscultation  bilaterally Abdomen: soft, non-tender; bowel sounds normal; no masses,  no organomegaly Extremities: extremities normal, atraumatic, no cyanosis or edema Wound: Chest tube sites are well-healed   Diagnostic Studies & Laboratory data:     Recent Radiology Findings:   No results found. There is no report from radiology as of yet patient's chest x-ray is reviewed, continues to have chronic changes in the left base but is slightly improved from last week.  Recent Lab Findings: Lab Results  Component Value Date   WBC 5.7 11/22/2017   HGB 9.8 (L) 11/22/2017   HCT 32.9 (L) 11/22/2017   PLT 312 11/22/2017   GLUCOSE 133 (H) 11/22/2017   CHOL 118 11/06/2017   TRIG 83 11/06/2017   HDL 29 (L) 11/06/2017   LDLCALC 72 11/06/2017    ALT 20 11/01/2017   AST 27 11/01/2017   NA 138 11/22/2017   K 3.8 11/22/2017   CL 102 11/22/2017   CREATININE 0.74 11/25/2017   BUN 10 11/25/2017   CO2 26 11/22/2017   TSH 2.115 10/29/2017   INR 1.17 10/29/2017   HGBA1C 6.0 (H) 11/03/2017      Assessment / Plan:   Patient slowly recovering from pulmonary infection with empyema on the left, continue p.o. antibiotics per ID Plan to see the patient back in 3 weeks for follow-up chest x-ray      Elijah OvensEdward B Lazette Estala MD      [address] [place] [id number] Office [phone]   Beeper [phone]  12/03/2017 5:26 PM

## 2017-12-14 ENCOUNTER — Ambulatory Visit: Payer: BLUE CROSS/BLUE SHIELD

## 2017-12-14 LAB — ACID FAST CULTURE WITH REFLEXED SENSITIVITIES (MYCOBACTERIA): Acid Fast Culture: NEGATIVE

## 2017-12-15 ENCOUNTER — Ambulatory Visit (INDEPENDENT_AMBULATORY_CARE_PROVIDER_SITE_OTHER): Payer: BLUE CROSS/BLUE SHIELD | Admitting: Internal Medicine

## 2017-12-15 ENCOUNTER — Encounter: Payer: Self-pay | Admitting: Internal Medicine

## 2017-12-15 DIAGNOSIS — Z720 Tobacco use: Secondary | ICD-10-CM

## 2017-12-15 DIAGNOSIS — J869 Pyothorax without fistula: Secondary | ICD-10-CM

## 2017-12-15 DIAGNOSIS — E041 Nontoxic single thyroid nodule: Secondary | ICD-10-CM | POA: Diagnosis not present

## 2017-12-15 MED ORDER — AMOXICILLIN-POT CLAVULANATE 875-125 MG PO TABS: 1.0000 | ORAL_TABLET | Freq: Two times a day (BID) | ORAL | 0 refills | Status: DC

## 2017-12-16 NOTE — Assessment & Plan Note (Signed)
I gave him recommendations on establishing with a PCP for further evaluation

## 2017-12-16 NOTE — Progress Notes (Signed)
   Subjective:    Patient ID: Elijah Gillespie, male    DOB: 1971-02-12, 47 y.o.   MRN: [id number]  HPI Here for hsfu. I saw him as an inpatient.  He initially developed a lower lobe empyema s/p VATS, mini thoracotomy and decortication in December 2018 and at follow up was significantly sob and follow up CT noted empyema and he required a pigtail catheter drainage by IR.  He had been on Augmentin for initial growth of Strep viridans and tolerating well. He continued on this after discharge since no new cultures grew anything significant.  Follow up on 1/31 with Dr. Tyrone SageGerhardt note continued effusion on CXR.  Clinically he has felt much better, no sob, though some fatigue with activity but improving progressively.  No fever or chills.    Review of Systems  Constitutional: Negative for fever.  Gastrointestinal: Negative for diarrhea.  Skin: Negative for rash.  Neurological: Negative for dizziness.       Objective:   Physical Exam  Constitutional: He appears well-developed and well-nourished. No distress.  HENT:  Mouth/Throat: No oropharyngeal exudate.  Eyes: No scleral icterus.  Cardiovascular: Normal rate and regular rhythm.  Pulmonary/Chest: Effort normal and breath sounds normal. No respiratory distress.  Skin: No rash noted.   SH: + tobacco       Assessment & Plan:

## 2017-12-16 NOTE — Assessment & Plan Note (Signed)
needs to quit.

## 2017-12-16 NOTE — Assessment & Plan Note (Signed)
Still some residual fluid and atelectasis on CXR.  I will have him continue with augmentin another 2 weeks though clinically he is responding well.  I suspect CXR findings are more residual fluid than abscess.   I will have him return as needed but he will call if he develops new symptoms of fever, more sob.

## 2017-12-18 ENCOUNTER — Ambulatory Visit (INDEPENDENT_AMBULATORY_CARE_PROVIDER_SITE_OTHER): Payer: BLUE CROSS/BLUE SHIELD | Admitting: Family Medicine

## 2017-12-18 ENCOUNTER — Encounter: Payer: Self-pay | Admitting: Family Medicine

## 2017-12-18 VITALS — BP 108/64 | HR 96 | Temp 98.7°F | Ht 75.0 in | Wt 263.5 lb

## 2017-12-18 DIAGNOSIS — G8929 Other chronic pain: Secondary | ICD-10-CM | POA: Diagnosis not present

## 2017-12-18 DIAGNOSIS — F411 Generalized anxiety disorder: Secondary | ICD-10-CM

## 2017-12-18 DIAGNOSIS — M545 Low back pain: Secondary | ICD-10-CM | POA: Diagnosis not present

## 2017-12-18 DIAGNOSIS — E041 Nontoxic single thyroid nodule: Secondary | ICD-10-CM

## 2017-12-18 NOTE — Patient Instructions (Addendum)
If you do not hear anything about your referral for a thyroid biopsy in the next 1-2 weeks, call our office and ask for an update.  Please consider counseling. Contact [phone] to schedule an appointment or inquire about cost/insurance coverage.  Let me know if you ever want to change things up for your anxiety medicine. It is entirely your decision though.   Call or send MyChart message when you need refills.  Let us know if you need anything.

## 2017-12-18 NOTE — Progress Notes (Signed)
Pre visit review using our clinic review tool, if applicable. No additional management support is needed unless otherwise documented below in the visit note. 

## 2017-12-18 NOTE — Progress Notes (Signed)
Chief Complaint  Patient presents with  . Establish Care       New Patient Visit SUBJECTIVE: HPI: Elijah Gillespie is an 47 y.o.male who is being seen for establishing care.  The patient was previously seen at [facility].  Pt has a hx of a thyroid nodule that needs a biopsy. His original office had some logistical barriers that frustrated him. He is not having any s/s's and has no pain.   He has a hx of chronic low back pain and is currently on chronic narcotic therapy initiated by his previous pcp. He has never had injections or been to a pain clinic.   He has a hx of GAD currently on citalopram. He used to be on Xanax, but his previous provider told him to choose as being on both is not ideal. He feels improved on the citalopram and is not having any AE's. His recent health issues and having adolescent children have served and stressors. No SI or HI.  He is a smoker and got the PCV23 around 2 years ago.   No Known Allergies  Past Medical History:  Diagnosis Date  . Depression with anxiety   . GERD (gastroesophageal reflux disease)   . Hyperlipidemia   . Hypertension   . Tobacco abuse    Past Surgical History:  Procedure Laterality Date  . ANKLE SURGERY    . FEMUR FRACTURE SURGERY    . IR THORACENTESIS ASP PLEURAL SPACE W/IMG GUIDE  10/28/2017  . KNEE SURGERY    . ROTATOR CUFF REPAIR    . VIDEO ASSISTED THORACOSCOPY (VATS)/DECORTICATION Left 10/30/2017   Procedure: LEFT VIDEO ASSISTED THORACOSCOPY (VATS)/ MINI THORACOTOMY/ DECORTICATION;  Surgeon: [name], MD;  Location: [facility] OR;  Service: Thoracic;  Laterality: Left;  Marland Kitchen VIDEO BRONCHOSCOPY N/A 10/30/2017   Procedure: VIDEO BRONCHOSCOPY;  Surgeon: [name], MD;  Location: [facility] OR;  Service: Thoracic;  Laterality: N/A;   Social History   Socioeconomic History  . Marital status: Married  Tobacco Use  . Smoking status: Current Every Day Smoker    Packs/day: 1.00    Types: Cigarettes  . Smokeless  tobacco: Never Used  Substance and Sexual Activity  . Alcohol use: No    Alcohol/week: 4.8 oz    Types: 4 Cans of beer, 4 Shots of liquor per week    Frequency: Never    Comment: denies use  . Drug use: No   Family History  Problem Relation Age of Onset  . Diabetes Mellitus II Mother   . Breast cancer Mother   . Diabetes Mother   . Cancer Mother        breast cancer  . Diabetes Mellitus II Father   . Diabetes Father     Current Outpatient Medications:  .  acetaminophen (TYLENOL) 500 MG tablet, Take 1 tablet (500 mg total) by mouth every 6 (six) hours as needed for mild pain, fever or headache., Disp: 1 tablet, Rfl: 0 .  amLODipine (NORVASC) 5 MG tablet, Take 1 tablet (5 mg total) by mouth daily., Disp: 30 tablet, Rfl: 0 .  amoxicillin-clavulanate (AUGMENTIN) 875-125 MG tablet, Take 1 tablet by mouth 2 (two) times daily., Disp: 28 tablet, Rfl: 0 .  atorvastatin (LIPITOR) 20 MG tablet, Take 20 mg by mouth every morning. , Disp: , Rfl:  .  citalopram (CELEXA) 20 MG tablet, Take 20 mg by mouth daily., Disp: , Rfl:  .  omeprazole (PRILOSEC) 20 MG capsule, Take 20 mg by mouth 2 (  two) times daily., Disp: , Rfl:  .  oxyCODONE-acetaminophen (PERCOCET/ROXICET) 5-325 MG tablet, Take 1 tablet by mouth every 8 (eight) hours as needed for severe pain. , Disp: , Rfl:  .  senna (SENOKOT) 8.6 MG TABS tablet, Take 1 tablet (8.6 mg total) by mouth at bedtime., Disp: 1 each, Rfl: 0 .  traZODone (DESYREL) 50 MG tablet, Take 50 mg by mouth at bedtime., Disp: , Rfl:   ROS Const: Denies fevers  MSK: +chronic lbp   OBJECTIVE: BP 108/64 (BP Location: Left Arm, Patient Position: Sitting, Cuff Size: Large)   Pulse 96   Temp 98.7 F (37.1 C) (Oral)   Ht 6\' 3"  (1.905 m)   Wt 263 lb 8 oz (119.5 kg)   SpO2 96%   BMI 32.94 kg/m   Constitutional: -  VS reviewed -  Well developed, well nourished, appears stated age -  No apparent distress  Psychiatric: -  Oriented to person, place, and time -  Memory  intact -  Affect and mood normal -  Fluent conversation, good eye contact -  Judgment and insight age appropriate  Eye: -  Conjunctivae clear, no discharge -  Pupils symmetric, round, reactive to light  ENMT: -  MMM    Pharynx moist, no exudate, no erythema  Neck: -  No gross swelling, no palpable masses -  Thyroid midline, not enlarged, mobile, no palpable masses  Cardiovascular: -  RRR -  No bruits -  No LE edema  Respiratory: -  Normal respiratory effort, no accessory muscle use, no retraction -  Breath sounds equal w exception of decreased sounds in LL lung field, no wheezes, no ronchi, no crackles  Neurological:  -  CN II - XII grossly intact -  Sensation grossly intact to light touch, equal bilaterally  Musculoskeletal: -  +TTP over b/l lumbar paraspinal msk  Skin: -  No significant lesion on inspection -  Warm and dry to palpation   ASSESSMENT/PLAN: Thyroid nodule - Plan: Ambulatory referral to ENT  Chronic bilateral low back pain without sciatica - Plan: Ambulatory referral to Pain Clinic  GAD (generalized anxiety disorder)  Patient instructed to sign release of records form from his previous PCP. Refer to ENT for FNA. Refer to pain clinic, I will bridge him until he can get in. Offered to increase citalopram vs adding BuSpar vs counseling. [facility].  Counseled on tobacco cessation.  Patient should return in 6 mo for med ck. The patient and his wife voiced understanding and agreement to the plan.   Jilda Rocheicholas Paul De SotoWendling, DO 12/18/17  12:01 PM

## 2017-12-30 ENCOUNTER — Other Ambulatory Visit: Payer: Self-pay | Admitting: Cardiothoracic Surgery

## 2017-12-30 DIAGNOSIS — J869 Pyothorax without fistula: Secondary | ICD-10-CM

## 2017-12-30 LAB — ACID FAST CULTURE WITH REFLEXED SENSITIVITIES (MYCOBACTERIA): Acid Fast Culture: NEGATIVE

## 2017-12-31 ENCOUNTER — Other Ambulatory Visit: Payer: Self-pay

## 2017-12-31 ENCOUNTER — Telehealth: Payer: Self-pay | Admitting: Family Medicine

## 2017-12-31 ENCOUNTER — Ambulatory Visit
Admission: RE | Admit: 2017-12-31 | Discharge: 2017-12-31 | Disposition: A | Discharge status: Home / Self Care | Payer: BLUE CROSS/BLUE SHIELD | Source: Ambulatory Visit | Attending: Cardiothoracic Surgery | Admitting: Cardiothoracic Surgery

## 2017-12-31 ENCOUNTER — Encounter: Payer: Self-pay | Admitting: Cardiothoracic Surgery

## 2017-12-31 ENCOUNTER — Ambulatory Visit (INDEPENDENT_AMBULATORY_CARE_PROVIDER_SITE_OTHER): Payer: Self-pay | Admitting: Cardiothoracic Surgery

## 2017-12-31 VITALS — BP 138/96 | HR 87 | Resp 18 | Ht 75.0 in | Wt 268.4 lb

## 2017-12-31 DIAGNOSIS — Z09 Encounter for follow-up examination after completed treatment for conditions other than malignant neoplasm: Secondary | ICD-10-CM

## 2017-12-31 DIAGNOSIS — J869 Pyothorax without fistula: Secondary | ICD-10-CM

## 2017-12-31 DIAGNOSIS — J90 Pleural effusion, not elsewhere classified: Secondary | ICD-10-CM

## 2017-12-31 NOTE — Progress Notes (Signed)
[address]       [place] [address]             [phone]      Elijah Gillespie [facility] Medical Record #[id number] Date of Birth: 1971-06-02  Referring: Alyson Reedy, MD Primary Care: Sharlene Dory, DO Primary Cardiologist: No primary care provider on file.   Chief Complaint:   POST OP FOLLOW UP 10/30/2017  OPERATIVE REPORT PREOPERATIVE DIAGNOSIS:  Empyema, left chest. POSTOPERATIVE DIAGNOSIS: Empyema, left chest. PROCEDURE:  Bronchoscopy, left video-assisted thoracoscopy, mini thoracotomy, decortication.   History of Present Illness:     Patient returns to the office today for follow-up visit after decortication for empyema and significant pneumonia in late December and January.  The patient has now completed his course of p.o. Antibiotics. He admits that he is slowly improving, but continues to note shortness of breath with exertion and noting his heart rate increasing with little activity.  He has had no hemoptysis, denies any fever or chills    Past Medical History:  Diagnosis Date  . Depression with anxiety   . GERD (gastroesophageal reflux disease)   . Hyperlipidemia   . Hypertension   . Tobacco abuse      Social History   Tobacco Use  Smoking Status Current Every Day Smoker  . Packs/day: 1.00  . Types: Cigarettes  Smokeless Tobacco Never Used    Social History   Substance and Sexual Activity  Alcohol Use No  . Alcohol/week: 4.8 oz  . Types: 4 Cans of beer, 4 Shots of liquor per week  . Frequency: Never   Comment: denies use     No Known Allergies  Current Outpatient Medications  Medication Sig Dispense Refill  . acetaminophen (TYLENOL) 500 MG tablet Take 1 tablet (500 mg total) by mouth every 6 (six) hours as needed for mild pain, fever or headache. 1 tablet 0  . amoxicillin-clavulanate (AUGMENTIN) 875-125 MG tablet Take 1 tablet by mouth 2 (two) times daily. 28 tablet 0  . atorvastatin (LIPITOR)  20 MG tablet Take 20 mg by mouth every morning.     . citalopram (CELEXA) 20 MG tablet Take 20 mg by mouth daily.    Marland Kitchen omeprazole (PRILOSEC) 20 MG capsule Take 20 mg by mouth 2 (two) times daily.    Marland Kitchen oxyCODONE-acetaminophen (PERCOCET/ROXICET) 5-325 MG tablet Take 1 tablet by mouth every 8 (eight) hours as needed for severe pain.     . traZODone (DESYREL) 50 MG tablet Take 50 mg by mouth at bedtime.     No current facility-administered medications for this visit.        Physical Exam: BP (!) 138/96 (BP Location: Left Arm, Patient Position: Sitting, Cuff Size: Large)   Pulse 87   Resp 18   Ht 6\' 3"  (1.905 m)   Wt 268 lb 6.4 oz (121.7 kg)   SpO2 96% Comment: RA  BMI 33.55 kg/m  General appearance: alert, cooperative and no distress Head: Normocephalic, without obvious abnormality, atraumatic Neck: no adenopathy, no carotid bruit, no JVD, supple, symmetrical, trachea midline and thyroid not enlarged, symmetric, no tenderness/mass/nodules Lymph nodes: Cervical, supraclavicular, and axillary nodes normal. Resp: clear to auscultation bilaterally Back: symmetric, no curvature. ROM normal. No CVA tenderness. Cardio: regular rate and rhythm, S1, S2 normal, no murmur, click, rub or gallop GI: soft, non-tender; bowel sounds normal; no masses,  no organomegaly Extremities: extremities normal, atraumatic, no cyanosis or edema and Homans sign is negative, no  sign of DVT Neurologic: Grossly normal   Diagnostic Studies & Laboratory data:     Recent Radiology Findings:   Dg Chest 2 View  Result Date: 12/31/2017 CLINICAL DATA:  47 year old male with LEFT chest pain and shortness of breath. History of LEFT VATS, mini thoracotomy and decortication for LEFT empyema on 10/30/2017. EXAM: CHEST  2 VIEW COMPARISON:  12/03/2017 and prior exams FINDINGS: LEFT pleural fluid/thickening is not significantly changed from 12/03/2017. Slightly improved LEFT basilar aeration noted. Cardiomediastinal silhouette  is unremarkable. No RIGHT pleural effusion noted. There is no evidence of airspace disease or pneumothorax. IMPRESSION: Slightly improved LEFT basilar aeration, otherwise unchanged appearance of the chest. No significant change in LEFT pleural fluid/thickening. Electronically Signed   By: Harmon PierJeffrey  Hu M.D.   On: 12/31/2017 09:09   I have independently reviewed the above radiology studies  and reviewed the findings with the patient.  Patient did have CTA of the chest to rule out pulmonary embolus in late January.    Recent Lab Findings: Lab Results  Component Value Date   WBC 5.7 11/22/2017   HGB 9.8 (L) 11/22/2017   HCT 32.9 (L) 11/22/2017   PLT 312 11/22/2017   GLUCOSE 133 (H) 11/22/2017   CHOL 118 11/06/2017   TRIG 83 11/06/2017   HDL 29 (L) 11/06/2017   LDLCALC 72 11/06/2017   ALT 20 11/01/2017   AST 27 11/01/2017   NA 138 11/22/2017   K 3.8 11/22/2017   CL 102 11/22/2017   CREATININE 0.74 11/25/2017   BUN 10 11/25/2017   CO2 26 11/22/2017   TSH 2.115 10/29/2017   INR 1.17 10/29/2017   HGBA1C 6.0 (H) 11/03/2017      Assessment / Plan:   Patient slowly recovering from pulmonary infection with empyema on the left, p.o. antibiotics have completed-without fever or chills Chest x-ray is improving with still blunting of the left costophrenic angle Will refer back to pulmonary  For follow-up with continued symptoms of shortness of breath and tachycardia with exertion. Plan to see back in 4 weeks with a follow-up chest x-ray      Delight OvensEdward B Keilin Gamboa MD      [address] [place] [id number] Office [phone]   Beeper [phone]  12/31/2017 10:11 AM

## 2017-12-31 NOTE — Telephone Encounter (Signed)
Copied from CRM [phone]#[id number]. Topic: Referral - Status >> Dec 31, 2017 11:14 AM Rudi CocoLathan, Saksham Akkerman M, NT wrote: CRM for notification. See Telephone encounter for:   12/31/17.  Reason for CRM: pt. Hasn't heard anything yet about referral for biopsy was told to call if he hasn't heard anything within a week. Pt. Would like for someone to give him a call with an update.

## 2017-12-31 NOTE — Telephone Encounter (Signed)
Any update? TY.

## 2018-01-04 ENCOUNTER — Telehealth: Payer: Self-pay | Admitting: *Deleted

## 2018-01-04 NOTE — Telephone Encounter (Signed)
[facility]; forwarded to provider/SLS 03/04

## 2018-01-07 ENCOUNTER — Encounter: Payer: Self-pay | Admitting: Family Medicine

## 2018-01-12 ENCOUNTER — Telehealth: Payer: Self-pay | Admitting: *Deleted

## 2018-01-12 NOTE — Telephone Encounter (Signed)
Left detailed message on machine that we called to check status and they should be calling him.

## 2018-01-12 NOTE — Telephone Encounter (Signed)
-----   [facility] sent at 01/12/2018  4:04 PM EDT ----- I called they received the referral but had not contacted the pt. Stated they was going to call and schedule him today ----- Message ----- From: Blima Ledgerichardson, Cathlene Gardella D, CMA Sent: 01/12/2018   9:10 AM To: Alleen BorneGwendolyn Davis  Can you check the status of ENT and call patient?

## 2018-01-25 ENCOUNTER — Ambulatory Visit (INDEPENDENT_AMBULATORY_CARE_PROVIDER_SITE_OTHER)
Admission: RE | Admit: 2018-01-25 | Discharge: 2018-01-25 | Disposition: A | Discharge status: Home / Self Care | Payer: BLUE CROSS/BLUE SHIELD | Source: Ambulatory Visit | Attending: Internal Medicine | Admitting: Internal Medicine

## 2018-01-25 ENCOUNTER — Encounter: Payer: Self-pay | Admitting: Internal Medicine

## 2018-01-25 ENCOUNTER — Ambulatory Visit (INDEPENDENT_AMBULATORY_CARE_PROVIDER_SITE_OTHER): Payer: BLUE CROSS/BLUE SHIELD | Admitting: Internal Medicine

## 2018-01-25 VITALS — BP 120/68 | HR 100 | Temp 98.4°F | Wt 270.0 lb

## 2018-01-25 VITALS — BP 136/84 | HR 84 | Ht 75.0 in | Wt 268.0 lb

## 2018-01-25 DIAGNOSIS — M792 Neuralgia and neuritis, unspecified: Secondary | ICD-10-CM

## 2018-01-25 DIAGNOSIS — R0689 Other abnormalities of breathing: Secondary | ICD-10-CM | POA: Diagnosis not present

## 2018-01-25 DIAGNOSIS — J869 Pyothorax without fistula: Secondary | ICD-10-CM | POA: Diagnosis not present

## 2018-01-25 DIAGNOSIS — Z87891 Personal history of nicotine dependence: Secondary | ICD-10-CM

## 2018-01-25 DIAGNOSIS — R0600 Dyspnea, unspecified: Secondary | ICD-10-CM

## 2018-01-25 DIAGNOSIS — E041 Nontoxic single thyroid nodule: Secondary | ICD-10-CM

## 2018-01-25 MED ORDER — GABAPENTIN 100 MG PO CAPS: ORAL_CAPSULE | ORAL | 1 refills | Status: DC

## 2018-01-25 NOTE — Patient Instructions (Addendum)
Empyema lung (HCC) Dyspnea and respiratory abnormalities  - I think you are improving and going through standard recovery  - doubt ongoing blood clot - Do  walk test in office  - Do cxr 2 view  History of smoking - please quit  Neuropathic pain of chest  - from post surgery  - Take gabapentin 100mg  once daily x 5 days, then 200mg  once daily x 5 days, then 300mg  once daily  to continue. If this makes you too sleepy or drowsy call us and we will cut your medication dosing down. Always take at bedtime. Do 30 day supply with 1 refill   Followup  -await call on cxr results  - eturn to see APP/MD in 8 weeks but plan can change depending on cxr result

## 2018-01-25 NOTE — Telephone Encounter (Unsigned)
Copied from CRM [id number]. Topic: Referral - Request >> Jan 25, 2018  3:53 PM Landry MellowFoltz, Melissa J wrote: Reason for CRM: wife bridget called - she needs to have a referral sent to :dr Pollyann Kennedyrosen [phone] . Pt has an appt on Monday, this needs to be done asap. Wife has already made the appt, just needs referral faxed to them

## 2018-01-25 NOTE — Progress Notes (Signed)
Subjective:    Patient ID: Elijah Gillespie, male    DOB: 01/02/1971, 47 y.o.   MRN: [id number]  PCP Sharlene DoryWendling, Nicholas Paul, DO  HPI  IOV 01/25/2018  Chief Complaint  Patient presents with  . Consult    Referred by Dr. Rexene AlbertsGerharft due to persistent SOB.  Pt stated he is needing to get checked out to see if he could possibly have a PE. Has c/o SOB with activity, mild cough, and CP. Pt was in hosp. 10/27/2017 - 11/09/2017 with pna and had to have mult chest tubes.    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  Presents with his wife for new consult.  He is status post chest tubes and decortication VATS after Christmas 2018 for empyema.  History starts back in October 2018 when he had a crush injury with fractures to his chest by his truck.  He is healed from it.  But later around Christmas 2018 developed empyema and [facility] [facility] and underwent VATS and chest tubes.  He was discharged a week after New Year's 2019.  Around November 25, 2017 due to persistent shortness of breath he had CT angiogram chest that ruled out pulmonary embolism.  The following chest x-ray was then late February 2019 approximately 1 month ago which showed small residual pleural effusion.  The main issue is that he tells me that since his discharge from the hospital he continues to be shortness of breath and some fatigue.  However in talking to him it seems like he is actually improving.  He feels that 75% close to baseline.  When he got discharged from the hospital he was unable to even take a shower or change clothes without dyspnea but now he is able to do that and work around the yard.  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  It appears shortness of breath and fatigue go hand in hand.  He does have some mild cough but this is baseline even before the empyema issue.  He has resumed smoking and does not been able to quit.  The other main issue is that he has neuropathic pain in the area of  surgery.  He is unable to wear a shirt.  He tells me that in the house he is always without a shirt.  He has tenderness anterior to the most anterior surgical scar.  He is interested in neuropathic medications  Walking desaturation test 185 feet x3 laps on room air:.  Resting pulse ox was 97%.  Resting heart rate was 87%.  He walked all 3 laps.  Final pulse ox was 98% showed that he did not desaturate final heart rate was 103/min he got tachycardic suggesting deconditioning.  He is concerned that he might have pulmonary embolism.  Images personally visualized  +  Results for Elijah FabianLINVILLE, Elijah (MRN [id number]) as of 01/25/2018 09:11  Ref. Range 11/08/2017 02:43 11/09/2017 02:39 11/20/2017 15:00 11/22/2017 04:37 11/25/2017 16:00  Hemoglobin Latest Ref Range: 13.0 - 17.0 g/dL 9.9 (L) 9.4 (L) 78.211.5 (L) 9.8 (L)      has a past medical history of Depression with anxiety, GERD (gastroesophageal reflux disease), Hyperlipidemia, Hypertension, and Tobacco abuse.   reports that he has been smoking cigarettes.  He has a 25.00 pack-year smoking history. He has never used smokeless tobacco.  Past Surgical History:  Procedure Laterality Date  . ANKLE SURGERY    . FEMUR FRACTURE SURGERY    . IR THORACENTESIS ASP PLEURAL SPACE  W/IMG GUIDE  10/28/2017  . KNEE SURGERY    . ROTATOR CUFF REPAIR    . VIDEO ASSISTED THORACOSCOPY (VATS)/DECORTICATION Left 10/30/2017   Procedure: LEFT VIDEO ASSISTED THORACOSCOPY (VATS)/ MINI THORACOTOMY/ DECORTICATION;  Surgeon: [name], MD;  Location: [facility] OR;  Service: Thoracic;  Laterality: Left;  Marland Kitchen VIDEO BRONCHOSCOPY N/A 10/30/2017   Procedure: VIDEO BRONCHOSCOPY;  Surgeon: [name], MD;  Location: [facility] OR;  Service: Thoracic;  Laterality: N/A;    Allergies  Allergen Reactions  . Latex     Reaction from latex glue pt had after prior surgery.    Immunization History  Administered Date(s) Administered  . Pneumococcal Polysaccharide-23 12/19/2015    Family  History  Problem Relation Age of Onset  . Diabetes Mellitus II Mother   . Breast cancer Mother   . Diabetes Mother   . Cancer Mother        breast cancer  . Diabetes Mellitus II Father   . Diabetes Father      Current Outpatient Medications:  .  atorvastatin (LIPITOR) 20 MG tablet, Take 20 mg by mouth every morning. , Disp: , Rfl:  .  citalopram (CELEXA) 20 MG tablet, Take 20 mg by mouth daily., Disp: , Rfl:  .  ibuprofen (ADVIL,MOTRIN) 200 MG tablet, Take 200 mg by mouth as needed for mild pain or moderate pain., Disp: , Rfl:  .  omeprazole (PRILOSEC) 20 MG capsule, Take 20 mg by mouth 2 (two) times daily., Disp: , Rfl:  .  oxyCODONE-acetaminophen (PERCOCET/ROXICET) 5-325 MG tablet, Take 1 tablet by mouth every 8 (eight) hours as needed for severe pain. , Disp: , Rfl:  .  traZODone (DESYREL) 50 MG tablet, Take 50 mg by mouth at bedtime., Disp: , Rfl:  .  terbinafine (LAMISIL) 250 MG tablet, , Disp: , Rfl: 0    Review of Systems  Constitutional: Negative for fever and unexpected weight change.  HENT: Negative for congestion, dental problem, ear pain, nosebleeds, postnasal drip, rhinorrhea, sinus pressure, sneezing, sore throat and trouble swallowing.   Eyes: Negative for redness and itching.  Respiratory: Positive for cough, shortness of breath and wheezing. Negative for chest tightness.   Cardiovascular: Positive for palpitations. Negative for leg swelling.  Gastrointestinal: Negative for nausea and vomiting.  Genitourinary: Negative for dysuria.  Musculoskeletal: Negative for joint swelling.  Skin: Negative for rash.  Allergic/Immunologic: Negative.  Negative for environmental allergies, food allergies and immunocompromised state.  Neurological: Negative for headaches.  Hematological: Does not bruise/bleed easily.  Psychiatric/Behavioral: Negative for dysphoric mood. The patient is nervous/anxious.        Objective:   Physical Exam  Constitutional: He is oriented to  person, place, and time. He appears well-developed and well-nourished. No distress.  tall  HENT:  Head: Normocephalic and atraumatic.  Right Ear: External ear normal.  Left Ear: External ear normal.  Mouth/Throat: Oropharynx is clear and moist. No oropharyngeal exudate.  Eyes: Pupils are equal, round, and reactive to light. Conjunctivae and EOM are normal. Right eye exhibits no discharge. Left eye exhibits no discharge. No scleral icterus.  Neck: Normal range of motion. Neck supple. No JVD present. No tracheal deviation present. No thyromegaly present.  Cardiovascular: Normal rate, regular rhythm and intact distal pulses. Exam reveals no gallop and no friction rub.  No murmur heard. Pulmonary/Chest: Effort normal and breath sounds normal. No respiratory distress. He has no wheezes. He has no rales. He exhibits no tenderness.  5 surgical scars in left infra =axillary area  Abdominal: Soft. Bowel sounds are normal. He exhibits no distension and no mass. There is no tenderness. There is no rebound and no guarding.  Musculoskeletal: Normal range of motion. He exhibits no edema or tenderness.  Lymphadenopathy:    He has no cervical adenopathy.  Neurological: He is alert and oriented to person, place, and time. He has normal reflexes. No cranial nerve deficit. Coordination normal.  Skin: Skin is warm and dry. No rash noted. He is not diaphoretic. No erythema. No pallor.  tatoos  Psychiatric: He has a normal mood and affect. His behavior is normal. Judgment and thought content normal.  Nursing note and vitals reviewed.     Vitals:   01/25/18 0901  BP: 136/84  Pulse: 84  SpO2: 94%  Weight: 268 lb (121.6 kg)  Height: 6\' 3"  (1.905 m)    Estimated body mass index is 33.5 kg/m as calculated from the following:   Height as of this encounter: 6\' 3"  (1.905 m).   Weight as of this encounter: 268 lb (121.6 kg).       Assessment & Plan:     ICD-10-CM   1. Empyema lung (HCC) J86.9   2.  Dyspnea and respiratory abnormalities R06.00    R06.89   3. History of smoking Z87.891   4. Neuropathic pain of chest M79.2      Empyema lung (HCC) Dyspnea and respiratory abnormalities  - I think you are improving and going through standard recovery  - doubt ongoing blood clot - Do  walk test in office  - Do cxr 2 view  History of smoking - please quit  Neuropathic pain of chest  - from post surgery  - Take gabapentin 100mg  once daily x 5 days, then 200mg  once daily x 5 days, then 300mg  once daily  to continue. If this makes you too sleepy or drowsy call us and we will cut your medication dosing down. Always take at bedtime. Do 30 day supply with 1 refill    Followup  -await call on cxr results  - eturn to see APP/MD in 8 weeks but plan can change depending on cxr result    Dr. Kalman Shan, M.D., [facility].C.P Pulmonary and Critical Care Medicine Staff Physician, [facility] Center Director - Interstitial Lung Disease  Program  Pulmonary Fibrosis [facility] at [facility] [place], [place], [address]  Pager: [phone], If no answer or between  15:00h - 7:00h: call [phone] Telephone: [phone]

## 2018-01-25 NOTE — Progress Notes (Signed)
   Subjective:    Patient ID: Elijah Gillespie, male    DOB: Aug 03, 1971, 47 y.o.   MRN: [id number]  HPI  47 year old patient who has a history of a multinodular goiter but with a dominant right mid nodule who is scheduled to have a FNA  biopsy of the thyroid office in our office today.    We explained to the patient that this office does not perform this procedure and we apologize for the inconvenience and miscommunication  Review of Systems     Objective:   Physical Exam        Assessment & Plan:   Thyroid nodule.  We will schedule FNA Follow-up patient's PCP  Rogelia BogaKWIATKOWSKI,Oleva Koo FRANK

## 2018-01-25 NOTE — Patient Instructions (Signed)
We will schedule thyroid biopsy and will be in touch with the time and location  Follow-up with your PCP  We are all so sorry for the inconvenience and miscommunication

## 2018-01-27 ENCOUNTER — Other Ambulatory Visit: Payer: Self-pay | Admitting: Cardiothoracic Surgery

## 2018-01-27 DIAGNOSIS — J869 Pyothorax without fistula: Secondary | ICD-10-CM

## 2018-01-28 ENCOUNTER — Ambulatory Visit
Admission: RE | Admit: 2018-01-28 | Discharge: 2018-01-28 | Disposition: A | Discharge status: Home / Self Care | Payer: BLUE CROSS/BLUE SHIELD | Source: Ambulatory Visit | Attending: Cardiothoracic Surgery | Admitting: Cardiothoracic Surgery

## 2018-01-28 ENCOUNTER — Telehealth: Payer: Self-pay | Admitting: Internal Medicine

## 2018-01-28 ENCOUNTER — Ambulatory Visit (INDEPENDENT_AMBULATORY_CARE_PROVIDER_SITE_OTHER): Payer: BLUE CROSS/BLUE SHIELD | Admitting: Orthopedic Surgery

## 2018-01-28 ENCOUNTER — Other Ambulatory Visit: Payer: Self-pay | Admitting: Family Medicine

## 2018-01-28 ENCOUNTER — Ambulatory Visit (INDEPENDENT_AMBULATORY_CARE_PROVIDER_SITE_OTHER): Payer: Self-pay | Admitting: Cardiothoracic Surgery

## 2018-01-28 ENCOUNTER — Encounter (INDEPENDENT_AMBULATORY_CARE_PROVIDER_SITE_OTHER): Payer: Self-pay | Admitting: Orthopedic Surgery

## 2018-01-28 VITALS — BP 116/78 | HR 95 | Resp 20 | Ht 75.0 in | Wt 271.0 lb

## 2018-01-28 VITALS — Ht 75.0 in | Wt 270.0 lb

## 2018-01-28 DIAGNOSIS — S4362XA Sprain of left sternoclavicular joint, initial encounter: Secondary | ICD-10-CM

## 2018-01-28 DIAGNOSIS — E041 Nontoxic single thyroid nodule: Secondary | ICD-10-CM

## 2018-01-28 DIAGNOSIS — J90 Pleural effusion, not elsewhere classified: Secondary | ICD-10-CM

## 2018-01-28 DIAGNOSIS — J869 Pyothorax without fistula: Secondary | ICD-10-CM

## 2018-01-28 DIAGNOSIS — Z09 Encounter for follow-up examination after completed treatment for conditions other than malignant neoplasm: Secondary | ICD-10-CM

## 2018-01-28 NOTE — Telephone Encounter (Signed)
Called patient, unable to reach left message to give us a call back. 

## 2018-01-28 NOTE — Progress Notes (Signed)
Office Visit Note   Patient: Elijah Gillespie           Date of Birth: [birth year]           MRN: [id number] Visit Date: 01/28/2018              Requested by: Sharlene Dory, DO [address] [place], [place] [address] PCP: Sharlene Dory, DO  Chief Complaint  Patient presents with  . Chest - Pain    Pain in left clavicle/sternum, s/p crush injury 10/18      HPI: Patient is a 47 year old gentleman who states he was working on his car when the car slipped off the jack and he had a crush injury with a bumper struck him on the ribs on the left side and pushed him into the wall on the right side.  Patient had a pneumothorax had multiple chest tubes and was hospitalized for about a month.  Patient states he still has pain at the sternoclavicular joint radiographs have been negative for dislocation.  Assessment & Plan: Visit Diagnoses:  1. Sprain of left sternoclavicular joint, initial encounter     Plan: Recommended that he could try anti-inflammatories or over-the-counter CBD lotion.  Discussed that a sprain without dislocation of the sternoclavicular joint could be a chronic problem.  Discussed that this may take a year or 2 to improve.  Follow-Up Instructions: Return if symptoms worsen or fail to improve.   Ortho Exam  Patient is alert, oriented, no adenopathy, well-dressed, normal affect, normal respiratory effort. Examination patient's left upper extremity is neurovascular intact he has no pain to palpation over the [facility] joint.  The sternoclavicular joint is symmetric with the right side there is tenderness to palpation directly over the sternoclavicular joint.  Patient is status post rib fractures and sternal fracture.  Imaging: No results found. No images are attached to the encounter.  Labs: Lab Results  Component Value Date   HGBA1C 6.0 (H) 11/03/2017   REPTSTATUS 11/26/2017 FINAL 11/21/2017   GRAMSTAIN  11/21/2017    ABUNDANT WBC  PRESENT,BOTH PMN AND MONONUCLEAR NO ORGANISMS SEEN    CULT No growth aerobically or anaerobically. 11/21/2017   LABORGA VIRIDANS STREPTOCOCCUS 10/28/2017    @LABSALLVALUES (HGBA1)@  Body mass index is 33.75 kg/m.  Orders:  No orders of the defined types were placed in this encounter.  No orders of the defined types were placed in this encounter.    Procedures: No procedures performed  Clinical Data: No additional findings.  ROS:  All other systems negative, except as noted in the HPI. Review of Systems  Objective: Vital Signs: Ht 6\' 3"  (1.905 m)   Wt 270 lb (122.5 kg)   BMI 33.75 kg/m   Specialty Comments:  No specialty comments available.  PMFS History: Patient Active Problem List   Diagnosis Date Noted  . Chronic bilateral low back pain without sciatica 12/18/2017  . GAD (generalized anxiety disorder) 12/18/2017  . Empyema lung (HCC) 11/20/2017  . Empyema (HCC) 10/30/2017  . Thyroid nodule 10/28/2017  . Hypertension   . Hyperlipidemia   . GERD (gastroesophageal reflux disease)   . Tobacco abuse   . Pain in limb 07/20/2013  . Swelling of limb 07/20/2013  . Phlebitis and thrombophlebitis of lower extremities, unspecified 07/20/2013   Past Medical History:  Diagnosis Date  . Depression with anxiety   . GERD (gastroesophageal reflux disease)   . Hyperlipidemia   . Hypertension   . Tobacco abuse  Family History  Problem Relation Age of Onset  . Diabetes Mellitus II Mother   . Breast cancer Mother   . Diabetes Mother   . Cancer Mother        breast cancer  . Diabetes Mellitus II Father   . Diabetes Father     Past Surgical History:  Procedure Laterality Date  . ANKLE SURGERY    . FEMUR FRACTURE SURGERY    . IR THORACENTESIS ASP PLEURAL SPACE W/IMG GUIDE  10/28/2017  . KNEE SURGERY    . ROTATOR CUFF REPAIR    . VIDEO ASSISTED THORACOSCOPY (VATS)/DECORTICATION Left 10/30/2017   Procedure: LEFT VIDEO ASSISTED THORACOSCOPY (VATS)/ MINI  THORACOTOMY/ DECORTICATION;  Surgeon: Delight OvensGerhardt, Edward B, MD;  Location: [facility] OR;  Service: Thoracic;  Laterality: Left;  Marland Kitchen. VIDEO BRONCHOSCOPY N/A 10/30/2017   Procedure: VIDEO BRONCHOSCOPY;  Surgeon: Delight OvensGerhardt, Edward B, MD;  [facility];  Service: Thoracic;  Laterality: N/A;   Social History   Occupational History  . Not on file  Tobacco Use  . Smoking status: Current Every Day Smoker    Packs/day: 1.00    Years: 25.00    Pack years: 25.00    Types: Cigarettes  . Smokeless tobacco: Never Used  Substance and Sexual Activity  . Alcohol use: No    Alcohol/week: 4.8 oz    Types: 4 Cans of beer, 4 Shots of liquor per week    Frequency: Never    Comment: denies use  . Drug use: No  . Sexual activity: Not on file

## 2018-01-28 NOTE — Progress Notes (Signed)
[address]       [place] [address]             [phone]      Blayton Huttner [facility] Medical Record #[id number] Date of Birth: 12-29-1970  Referring: Alyson Reedy, MD Primary Care: Sharlene Dory, DO Primary Cardiologist: No primary care provider on file.   Chief Complaint:   POST OP FOLLOW UP 10/30/2017  OPERATIVE REPORT PREOPERATIVE DIAGNOSIS:  Empyema, left chest. POSTOPERATIVE DIAGNOSIS: Empyema, left chest. PROCEDURE:  Bronchoscopy, left video-assisted thoracoscopy, mini thoracotomy, decortication.   History of Present Illness:     Patient returns to the office today for follow-up visit after decortication for empyema and significant pneumonia in late December and January.  The patient has now completed his course of p.o. Antibiotics. He admits that he is slowly improving, but continues to note shortness of breath with exertion and noting his heart rate increasing with little activity.  He has had no hemoptysis, denies any fever or chills    Past Medical History:  Diagnosis Date  . Depression with anxiety   . GERD (gastroesophageal reflux disease)   . Hyperlipidemia   . Hypertension   . Tobacco abuse      Social History   Tobacco Use  Smoking Status Current Every Day Smoker  . Packs/day: 1.00  . Years: 25.00  . Pack years: 25.00  . Types: Cigarettes  Smokeless Tobacco Never Used    Social History   Substance and Sexual Activity  Alcohol Use No  . Alcohol/week: 4.8 oz  . Types: 4 Cans of beer, 4 Shots of liquor per week  . Frequency: Never   Comment: denies use     Allergies  Allergen Reactions  . Latex     Reaction from latex glue pt had after prior surgery.    Current Outpatient Medications  Medication Sig Dispense Refill  . atorvastatin (LIPITOR) 20 MG tablet Take 20 mg by mouth every morning.     . citalopram (CELEXA) 20 MG tablet Take 20 mg by mouth daily.    Marland Kitchen gabapentin (NEURONTIN) 100  MG capsule 100mg  daily x5 days, 200mg  daily x5 days, then 300mg  daily to continue on 3 tabs daily 30 capsule 1  . ibuprofen (ADVIL,MOTRIN) 200 MG tablet Take 200 mg by mouth as needed for mild pain or moderate pain.    Marland Kitchen omeprazole (PRILOSEC) 20 MG capsule Take 20 mg by mouth 2 (two) times daily.    Marland Kitchen oxyCODONE-acetaminophen (PERCOCET/ROXICET) 5-325 MG tablet Take 1 tablet by mouth every 8 (eight) hours as needed for severe pain.     Marland Kitchen terbinafine (LAMISIL) 250 MG tablet   0  . traZODone (DESYREL) 50 MG tablet Take 50 mg by mouth at bedtime.     No current facility-administered medications for this visit.        Physical Exam: BP 116/78   Pulse 95   Resp 20   Ht 6\' 3"  (1.905 m)   Wt 271 lb (122.9 kg)   SpO2 97% Comment: RA  BMI 33.87 kg/m  General appearance: alert, cooperative and no distress Head: Normocephalic, without obvious abnormality, atraumatic Neck: no adenopathy, no carotid bruit, no JVD, supple, symmetrical, trachea midline and thyroid not enlarged, symmetric, no tenderness/mass/nodules Lymph nodes: Cervical, supraclavicular, and axillary nodes normal. Resp: clear to auscultation bilaterally Back: symmetric, no curvature. ROM normal. No CVA tenderness. Cardio: regular rate and rhythm, S1, S2 normal, no murmur, click, rub or gallop  GI: soft, non-tender; bowel sounds normal; no masses,  no organomegaly Extremities: extremities normal, atraumatic, no cyanosis or edema and Homans sign is negative, no sign of DVT Neurologic: Grossly normal   Diagnostic Studies & Laboratory data:     Recent Radiology Findings:   Dg Chest 2 View  Result Date: 01/28/2018 CLINICAL DATA:  Follow-up pleural fluid on the left EXAM: CHEST - 2 VIEW COMPARISON:  01/25/2018 FINDINGS: Cardiac shadow is stable. Right lung is clear. The left lung is well aerated although again demonstrates a small amount of pleural fluid stable from the previous exam. Old rib fractures are noted on the left  posteriorly. IMPRESSION: Stable appearing small left pleural effusion. Electronically Signed   By: Alcide CleverMark  Lukens M.D.   On: 01/28/2018 15:41   I have independently reviewed the above radiology studies  and reviewed the findings with the patient.  Patient did have CTA of the chest to rule out pulmonary embolus in late January.    Recent Lab Findings: Lab Results  Component Value Date   WBC 5.7 11/22/2017   HGB 9.8 (L) 11/22/2017   HCT 32.9 (L) 11/22/2017   PLT 312 11/22/2017   GLUCOSE 133 (H) 11/22/2017   CHOL 118 11/06/2017   TRIG 83 11/06/2017   HDL 29 (L) 11/06/2017   LDLCALC 72 11/06/2017   ALT 20 11/01/2017   AST 27 11/01/2017   NA 138 11/22/2017   K 3.8 11/22/2017   CL 102 11/22/2017   CREATININE 0.74 11/25/2017   BUN 10 11/25/2017   CO2 26 11/22/2017   TSH 2.115 10/29/2017   INR 1.17 10/29/2017   HGBA1C 6.0 (H) 11/03/2017      Assessment / Plan:   Patient slowly recovering from pulmonary infection with empyema on the left, p.o. 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 Plan to see him back as necessary    Delight OvensEdward B Donell Sliwinski MD      [address] [place] [id number] Office [phone]   Beeper [phone]  01/28/2018 3:54 PM

## 2018-01-28 NOTE — Telephone Encounter (Signed)
Pt has already been scheduled with Dr. Pollyann Kennedyosen

## 2018-01-28 NOTE — Telephone Encounter (Signed)
Copied from CRM [id number]. Topic: Referral - Request >> Jan 25, 2018  3:53 PM Landry MellowFoltz, Melissa J wrote: Reason for CRM: wife bridget called - she needs to have a referral sent to :dr Pollyann Kennedyrosen [phone] . Pt has an appt on Monday, this needs to be done asap. Wife has already made the appt, just needs referral faxed to them >> Jan 28, 2018  8:48 AM Rudi CocoLathan, Anaijah Augsburger M, NT wrote: wife bridget called again - she needs to have a referral sent to Dr Pollyann Kennedyrosen [phone] . Pt has an appt on Monday, this needs to be done asap. Wife has already made the appt, just needs referral faxed to them

## 2018-01-29 ENCOUNTER — Telehealth: Payer: Self-pay | Admitting: Internal Medicine

## 2018-01-29 MED ORDER — TRAMADOL HCL 50 MG PO TABS: 50.0000 mg | ORAL_TABLET | Freq: Two times a day (BID) | ORAL | 1 refills | Status: AC | PRN

## 2018-01-29 NOTE — Telephone Encounter (Signed)
Called pt letting him know we were going to dc the gabapentin and place it on his list of allergies due to it causing headaches.  Stated to pt we were going to send a script of tramadol for him to try and stated to him if that did not work, he needed to f/u with PCP.  Pt expressed understanding. Verified pt's pharmacy and script sent in.  Nothing further needed at this time.

## 2018-01-29 NOTE — Telephone Encounter (Signed)
In this case let patient know I am kind of getting out of my expertise with pain mgmt. I recommend now  - tramadol 50mg  Twice daily as needed - 15 day supply with 1 refill - after this any further pain mgmt with PCP   Dr. Kalman ShanMurali Keelan Tripodi, M.D., [facility]P Pulmonary and Critical Care Medicine Staff Physician, [facility] Center Director - Interstitial Lung Disease  Program  Pulmonary Fibrosis [facility] - Care Center Network at [facility] [place], [place], [id number]  Pager: [phone], If no answer or between  15:00h - 7:00h: call [phone] Telephone: [phone]

## 2018-01-29 NOTE — Telephone Encounter (Signed)
Dc gabapentin - list in allergy as headache  Can try tramadol 50mg  twice daily as needed. 30 day supply with 1 refill  If this does not work, he should go to PCP [employer], Jilda RocheNicholas Paul, DO because I have no expeereicne using lyrica or ellavil  Dr. Kalman ShanMurali Dailynn Nancarrow, M.D., [facility].C.P Pulmonary and Critical Care Medicine Staff Physician, [facility] Center Director - Interstitial Lung Disease  Program  Pulmonary Fibrosis [facility] - Care Center Network at [facility] [place], [place], [address]  Pager: [phone], If no answer or between  15:00h - 7:00h: call [phone] Telephone: [phone]

## 2018-01-29 NOTE — Telephone Encounter (Signed)
Called and spoke with Marchelle Folksmanda, she wanted to make MR aware that patient is also taking oxycodone 5/325 for chronic pain. They will not dispense the tramadol until we get an ok from MR   MR please advise.

## 2018-01-29 NOTE — Telephone Encounter (Signed)
Spoke with patient. He was prescribed gabapentin 100mg  after his visit with MR on 01/25/18. He stated the the medication Tuesday and noticed that he developed a headache. Noticed the same thing when he took the medication on Wednesday. He did not take the medication yesterday and did not have a headache.   He has stopped taking medication for now. And wants to know if MR had any suggestions for him.   Patient wishes to use [facility] [place].   MR, please advise. Thanks!

## 2018-01-29 NOTE — Telephone Encounter (Signed)
Called and spoke with amanda from pharmacy, she is aware and patient is aware as well.

## 2018-03-22 ENCOUNTER — Ambulatory Visit: Payer: BLUE CROSS/BLUE SHIELD | Admitting: Adult Health

## 2018-06-17 ENCOUNTER — Ambulatory Visit: Payer: BLUE CROSS/BLUE SHIELD | Admitting: Family Medicine

## 2018-12-16 DIAGNOSIS — Z736 Limitation of activities due to disability: Secondary | ICD-10-CM

## 2019-04-26 ENCOUNTER — Other Ambulatory Visit: Payer: Self-pay | Admitting: Otolaryngology

## 2019-04-26 DIAGNOSIS — E041 Nontoxic single thyroid nodule: Secondary | ICD-10-CM

## 2019-05-05 ENCOUNTER — Ambulatory Visit
Admission: RE | Admit: 2019-05-05 | Discharge: 2019-05-05 | Disposition: A | Discharge status: Home / Self Care | Payer: BLUE CROSS/BLUE SHIELD | Source: Ambulatory Visit | Attending: Otolaryngology | Admitting: Otolaryngology

## 2019-05-05 ENCOUNTER — Other Ambulatory Visit: Payer: Self-pay

## 2019-05-05 DIAGNOSIS — E041 Nontoxic single thyroid nodule: Secondary | ICD-10-CM

## 2019-09-28 IMAGING — DX DG CHEST 2V
2 series · 2 of 2 positions shown · non-contrast
Comparison: None.

CLINICAL DATA: 45 y/o M; injury 1 month ago with persistent chest
tightness, congestion, shortness of breath.

EXAM:
CHEST  2 VIEW

[chest pa]
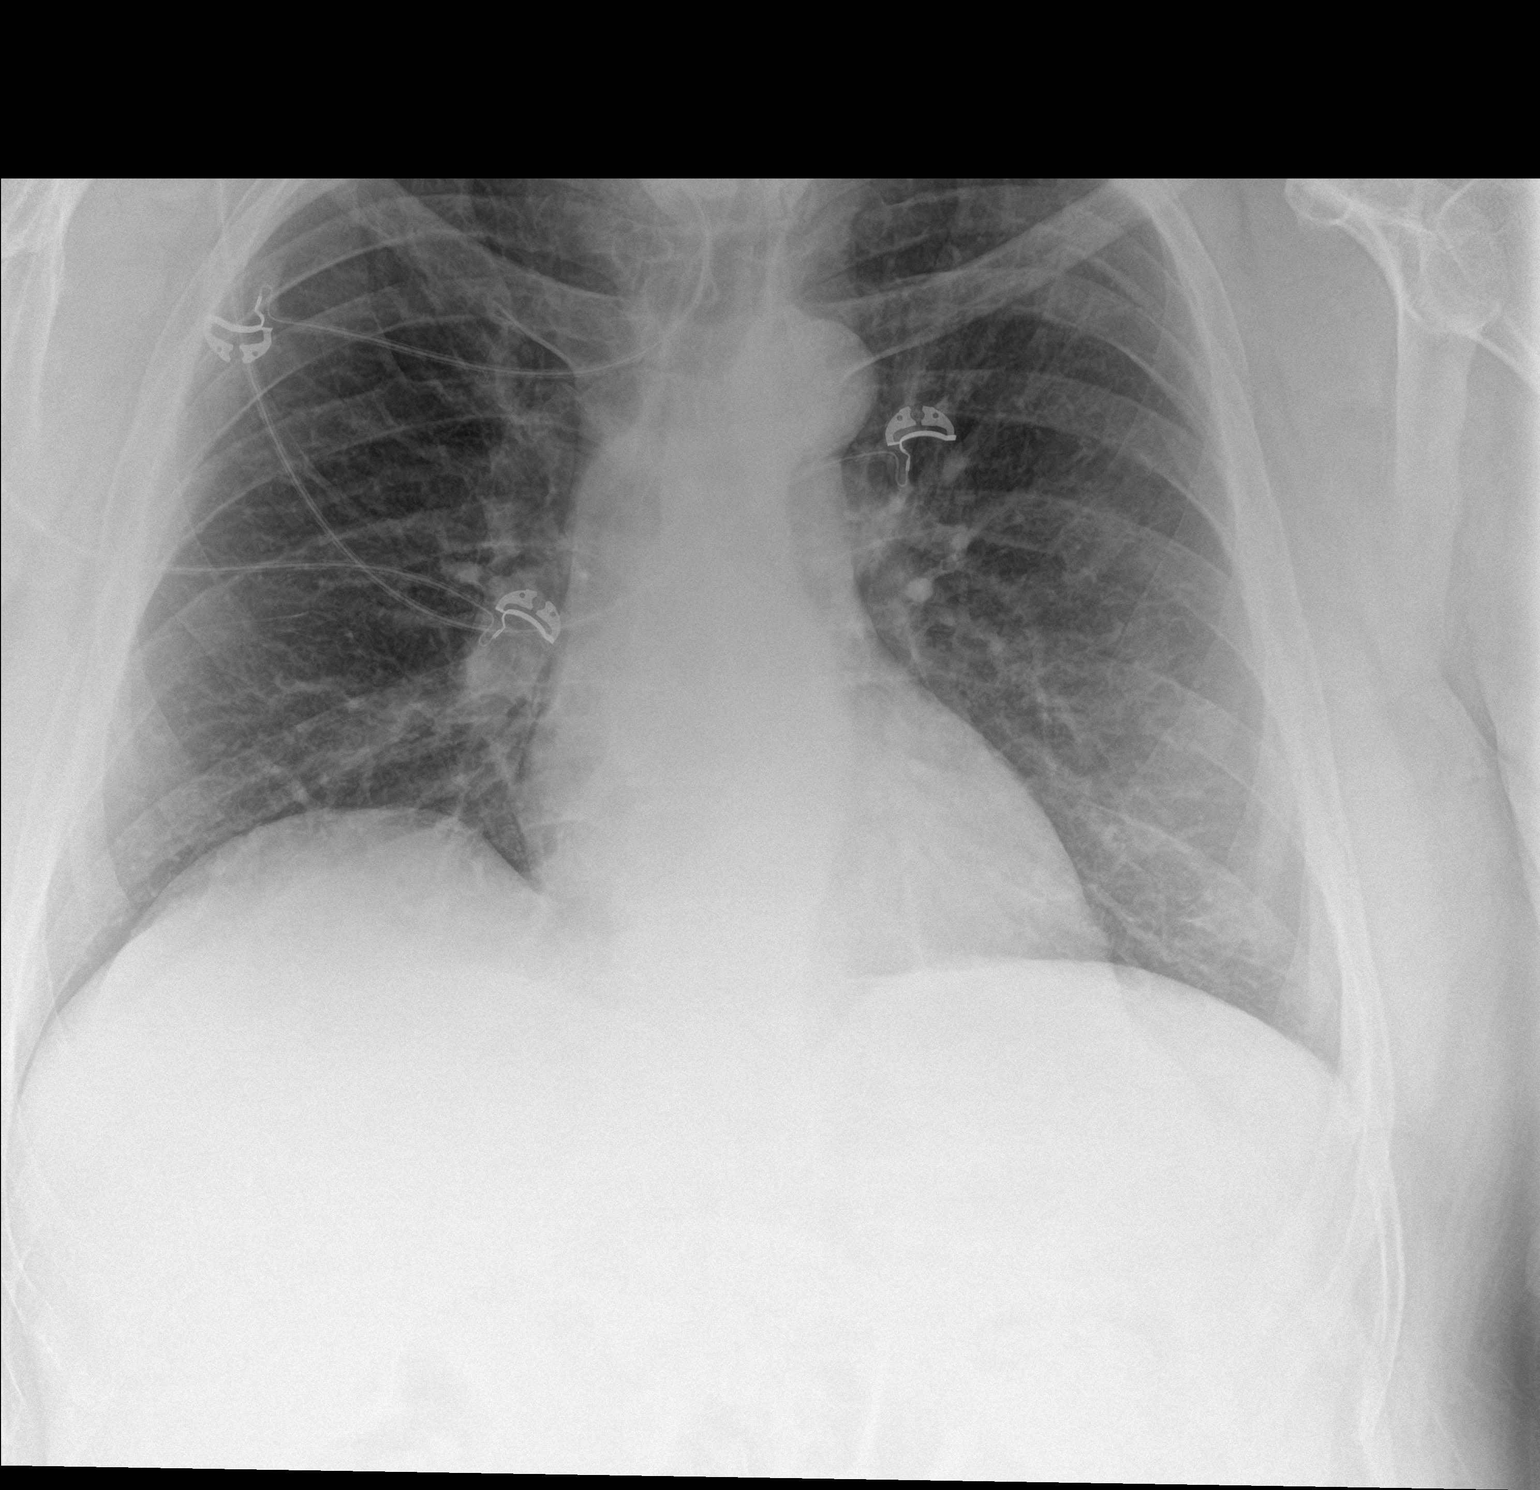

[chest lat]
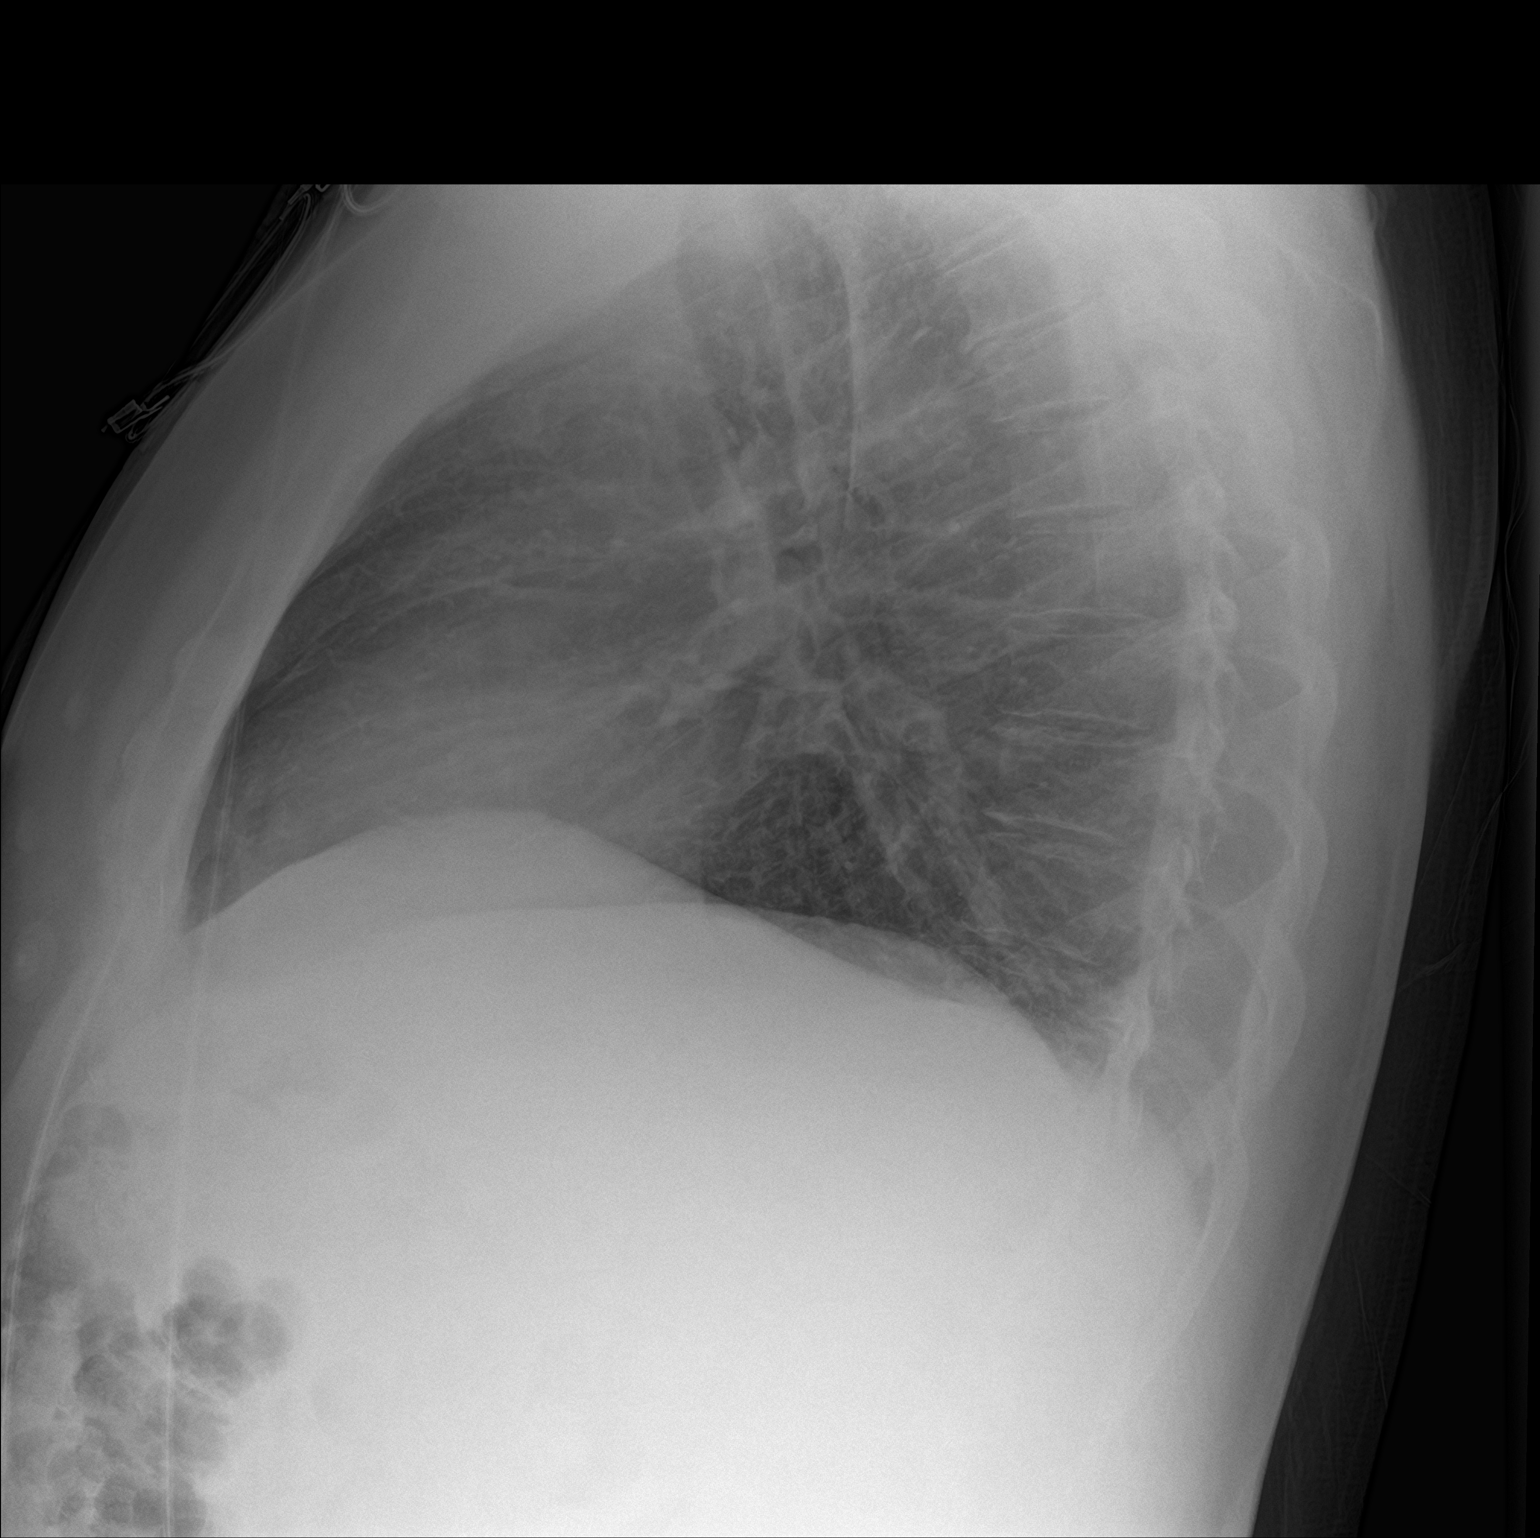

[2 of 2 positions shown; findings below may reference images not displayed]

FINDINGS: State heart size and mediastinal contours are within normal limits.
Both lungs are clear. Left manubrium fracture is not appreciated
radiographically. No additional fracture identified.
IMPRESSION: No acute pulmonary process identified.

By: Rantona Bhebhe M.D.

## 2019-11-15 IMAGING — CT CT ANGIO CHEST
2 of 8 series · 18 of 36 positions shown · IV contrast (OMNI)
Comparison: 10/28/2017

CLINICAL DATA: Chest pain and shortness of breath. Chest tubes
recently removed. Positive D-dimer.

EXAM:
CT ANGIOGRAPHY CHEST WITH CONTRAST
TECHNIQUE: Multidetector CT imaging of the chest was performed using the
standard protocol during bolus administration of intravenous
contrast. Multiplanar CT image reconstructions and MIPs were
obtained to evaluate the vascular anatomy.
CONTRAST:  100mL [id number] IOPAMIDOL ([id number]) INJECTION 76%

[Series 8: thins · axial · 0.86mm/px · z∈[+1156,+1444]mm · 17 of 322 slices shown]
[im 17/322  lung]
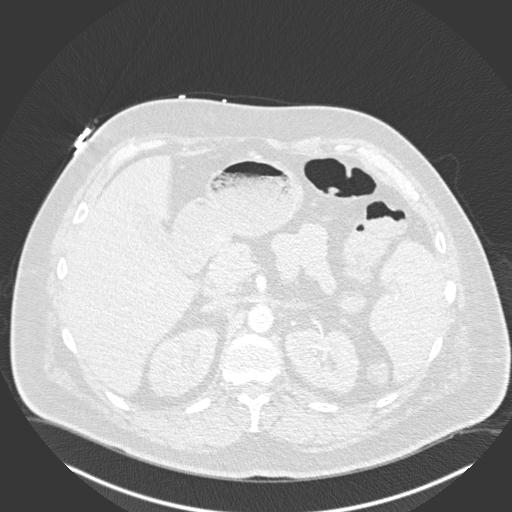
[im 34/322  mediastinal]
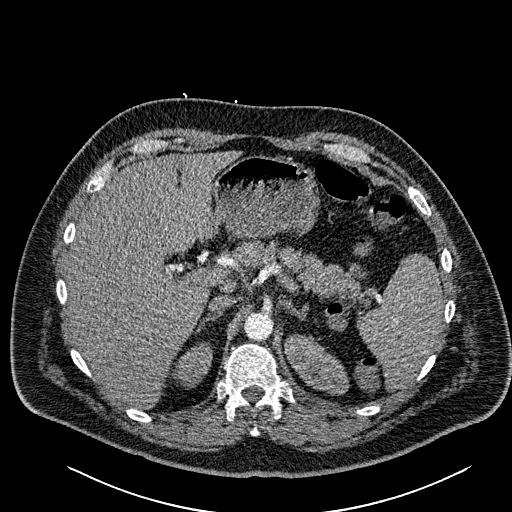
[im 51/322  lung]
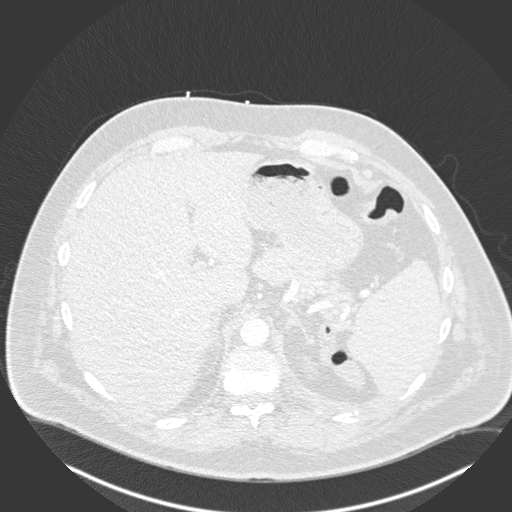
[im 68/322  mediastinal]
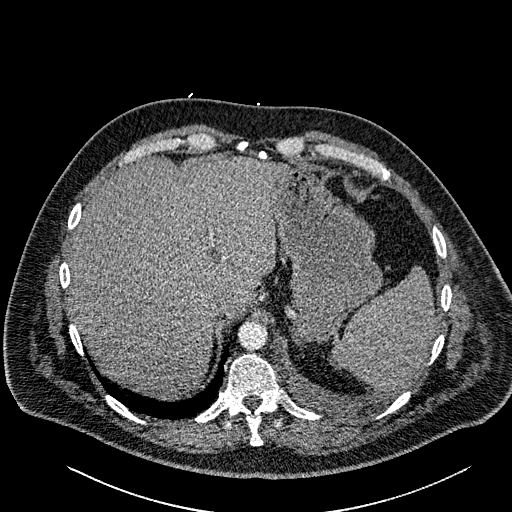
[im 85/322  lung]
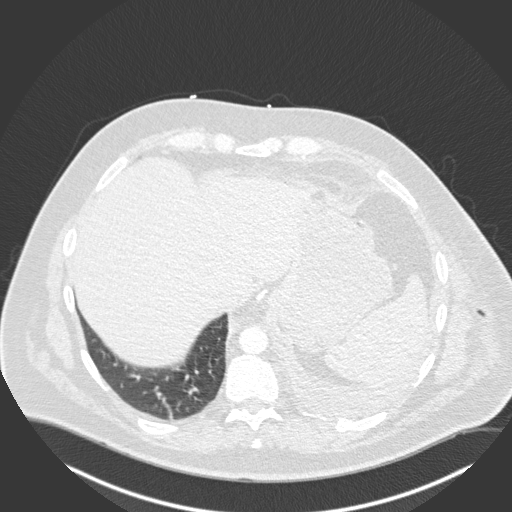
[im 102/322  mediastinal]
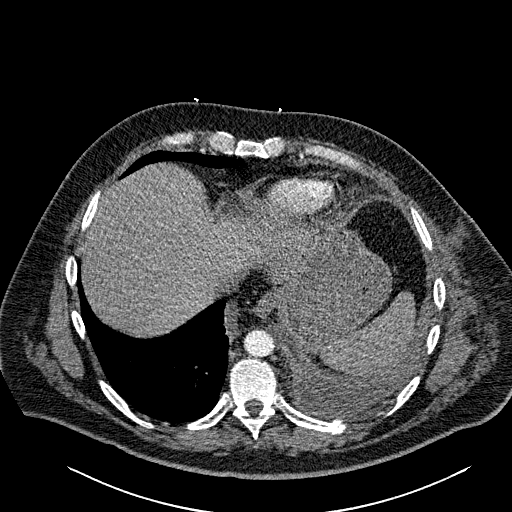
[im 119/322  lung]
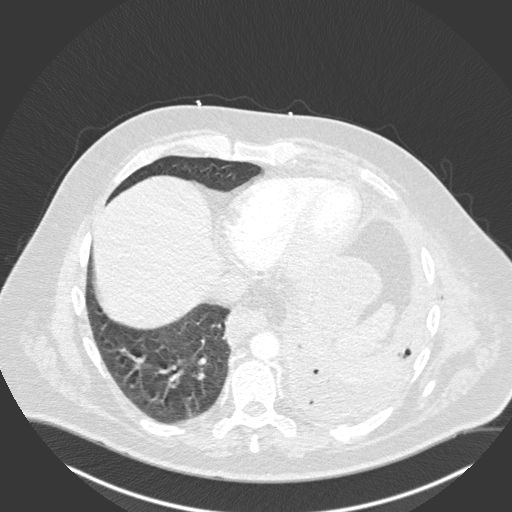
[im 136/322  mediastinal]
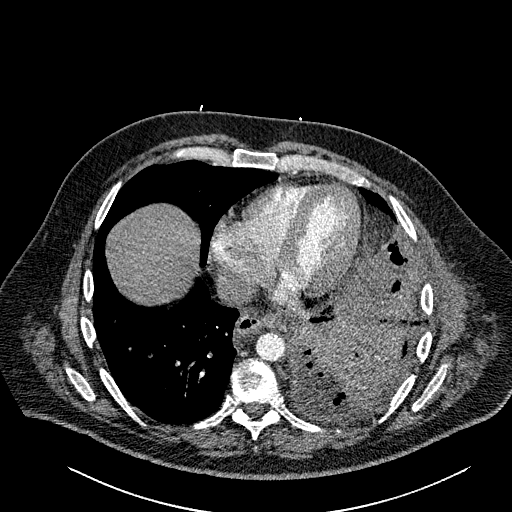
[im 169/322  lung]
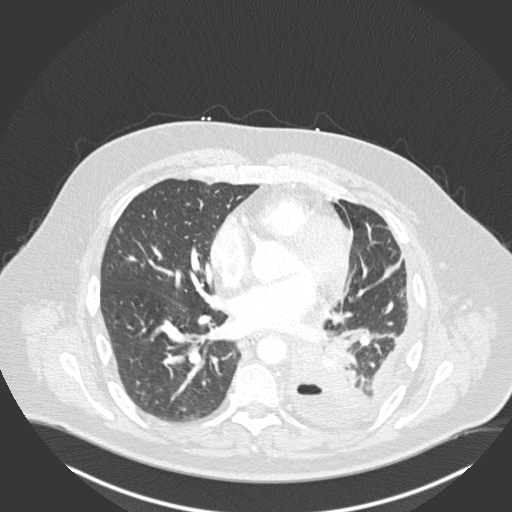
[im 186/322  mediastinal]
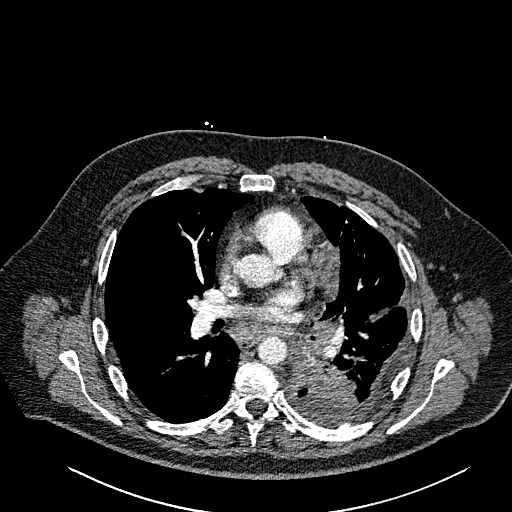
[im 203/322  lung]
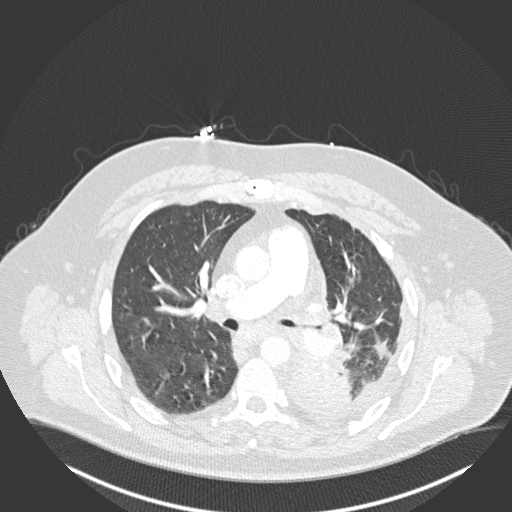
[im 220/322  mediastinal]
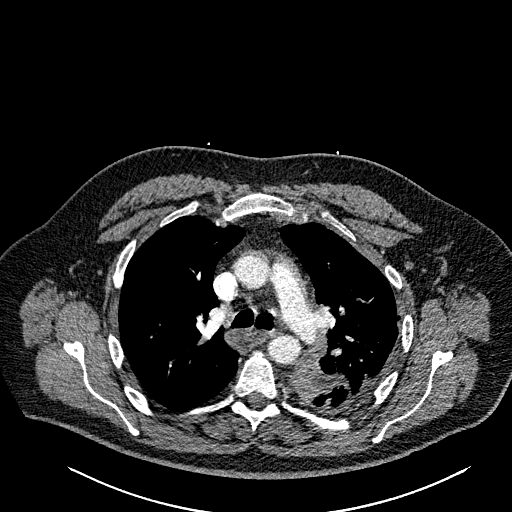
[im 237/322  lung]
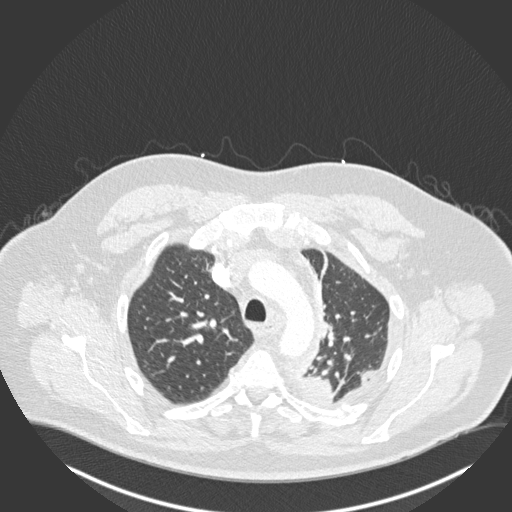
[im 254/322  mediastinal]
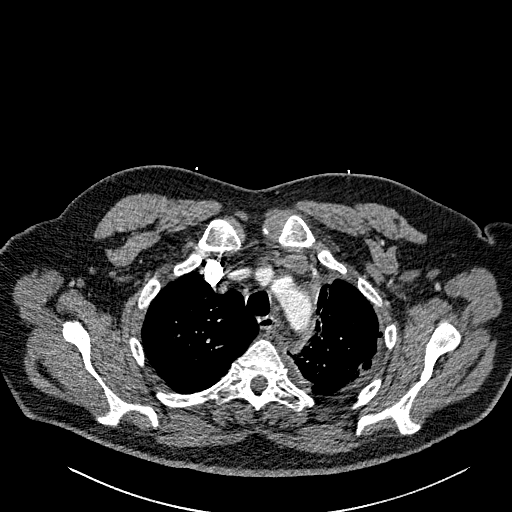
[im 271/322  lung]
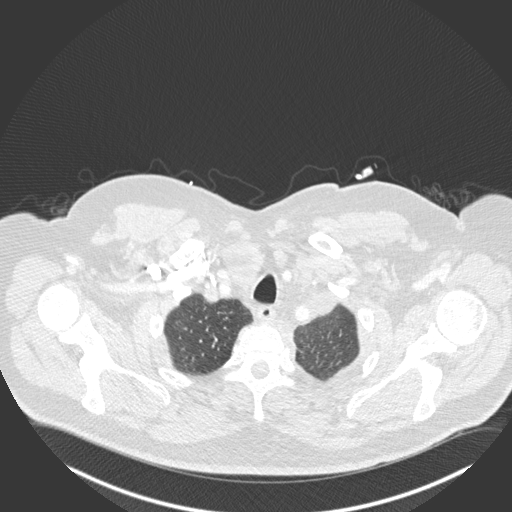
[im 288/322  mediastinal]
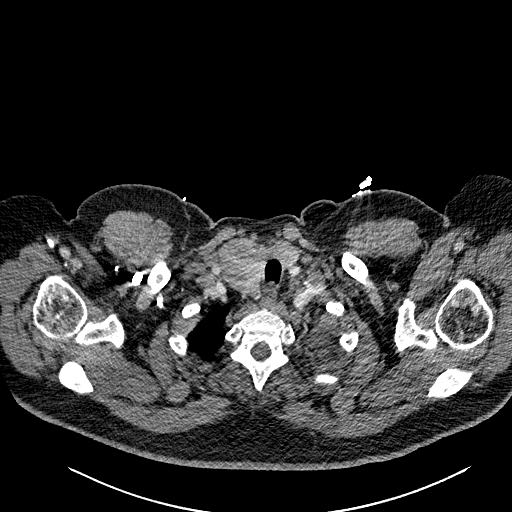
[im 305/322  lung]
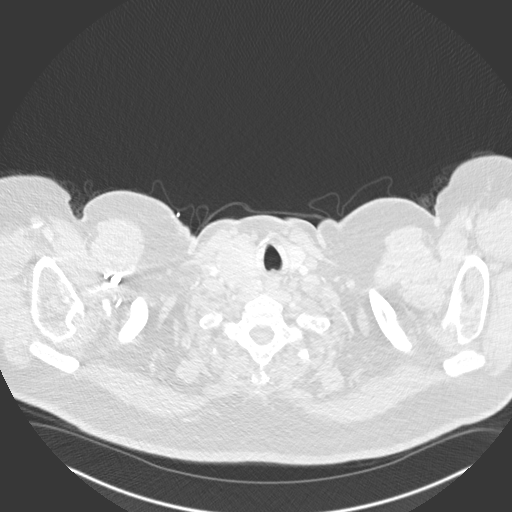

[Series 11: coronal mpr · coronal · 0.58mm/px · 1 of 151 slices shown]
[im 76/151  mediastinal]
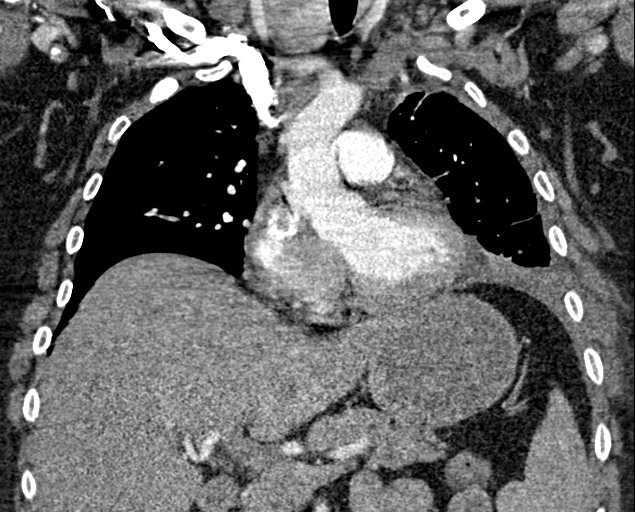

[18 of 36 positions shown; findings below may reference images not displayed]

FINDINGS: Cardiovascular: Moderately good opacification of the central and
segmental pulmonary arteries. No focal filling defects demonstrated.
No evidence of significant pulmonary embolus. Normal heart size. No
pericardial effusion. Normal caliber thoracic aorta. No evidence of
aortic dissection. Great vessel origins are patent.

Mediastinum/Nodes: Scattered mediastinal lymph nodes are moderately
prominent without pathologic enlargement, likely reactive. Diffuse
heterogeneous enlargement of the right lobe of the thyroid gland
likely representing goiter. No change since prior study. Esophagus
is decompressed.

Lungs/Pleura: Small to moderate left pleural effusion with multiple
gas bubbles. Gas in the effusion could relate from previous catheter
insertion or may indicate infection/empyema. Consolidation in the
left lower lung likely representing pneumonia or atelectasis.
Follow-up after resolution of acute process is recommended to
exclude underlying neoplasm which remains a possibility. Airways are
patent. Right lung is clear.

Upper Abdomen: No acute process demonstrated in the visualized upper
abdominal organs.

Musculoskeletal: Soft tissue infiltration and subcutaneous gas in
the left chest wall with small left chest wall hematoma. This likely
represents the previous catheter insertion site. Multiple bilateral
old rib fractures. Subacute left lower eleventh rib fracture.

Review of the MIP images confirms the above findings.
IMPRESSION: 1. No evidence of significant pulmonary embolus.
2. Probable reactive lymph nodes in the mediastinum.
3. Heterogeneous enlargement of the right thyroid likely represents
goiter.
4. Moderate left pleural effusion with multiple gas bubbles. This
could relate to previous catheter insertion or may indicate
infection/empyema. Left lower lung consolidation may represent
pneumonia or atelectasis. Follow-up after resolution of acute
process is recommended to exclude underlying neoplasm which remains
a possibility.
5. Soft tissue infiltration, subcutaneous gas, and small hematoma in
the left chest wall likely representing a previous catheter
insertion site.
6. Old bilateral rib fractures.

## 2019-11-16 IMAGING — CR DG CHEST 2V
2 series · 2 of 2 positions shown · non-contrast
Comparison: CTA chest dated 11/07/2017

CLINICAL DATA: Status post bronchoscopy and chest tube

EXAM:
CHEST  2 VIEW

[chest pa]
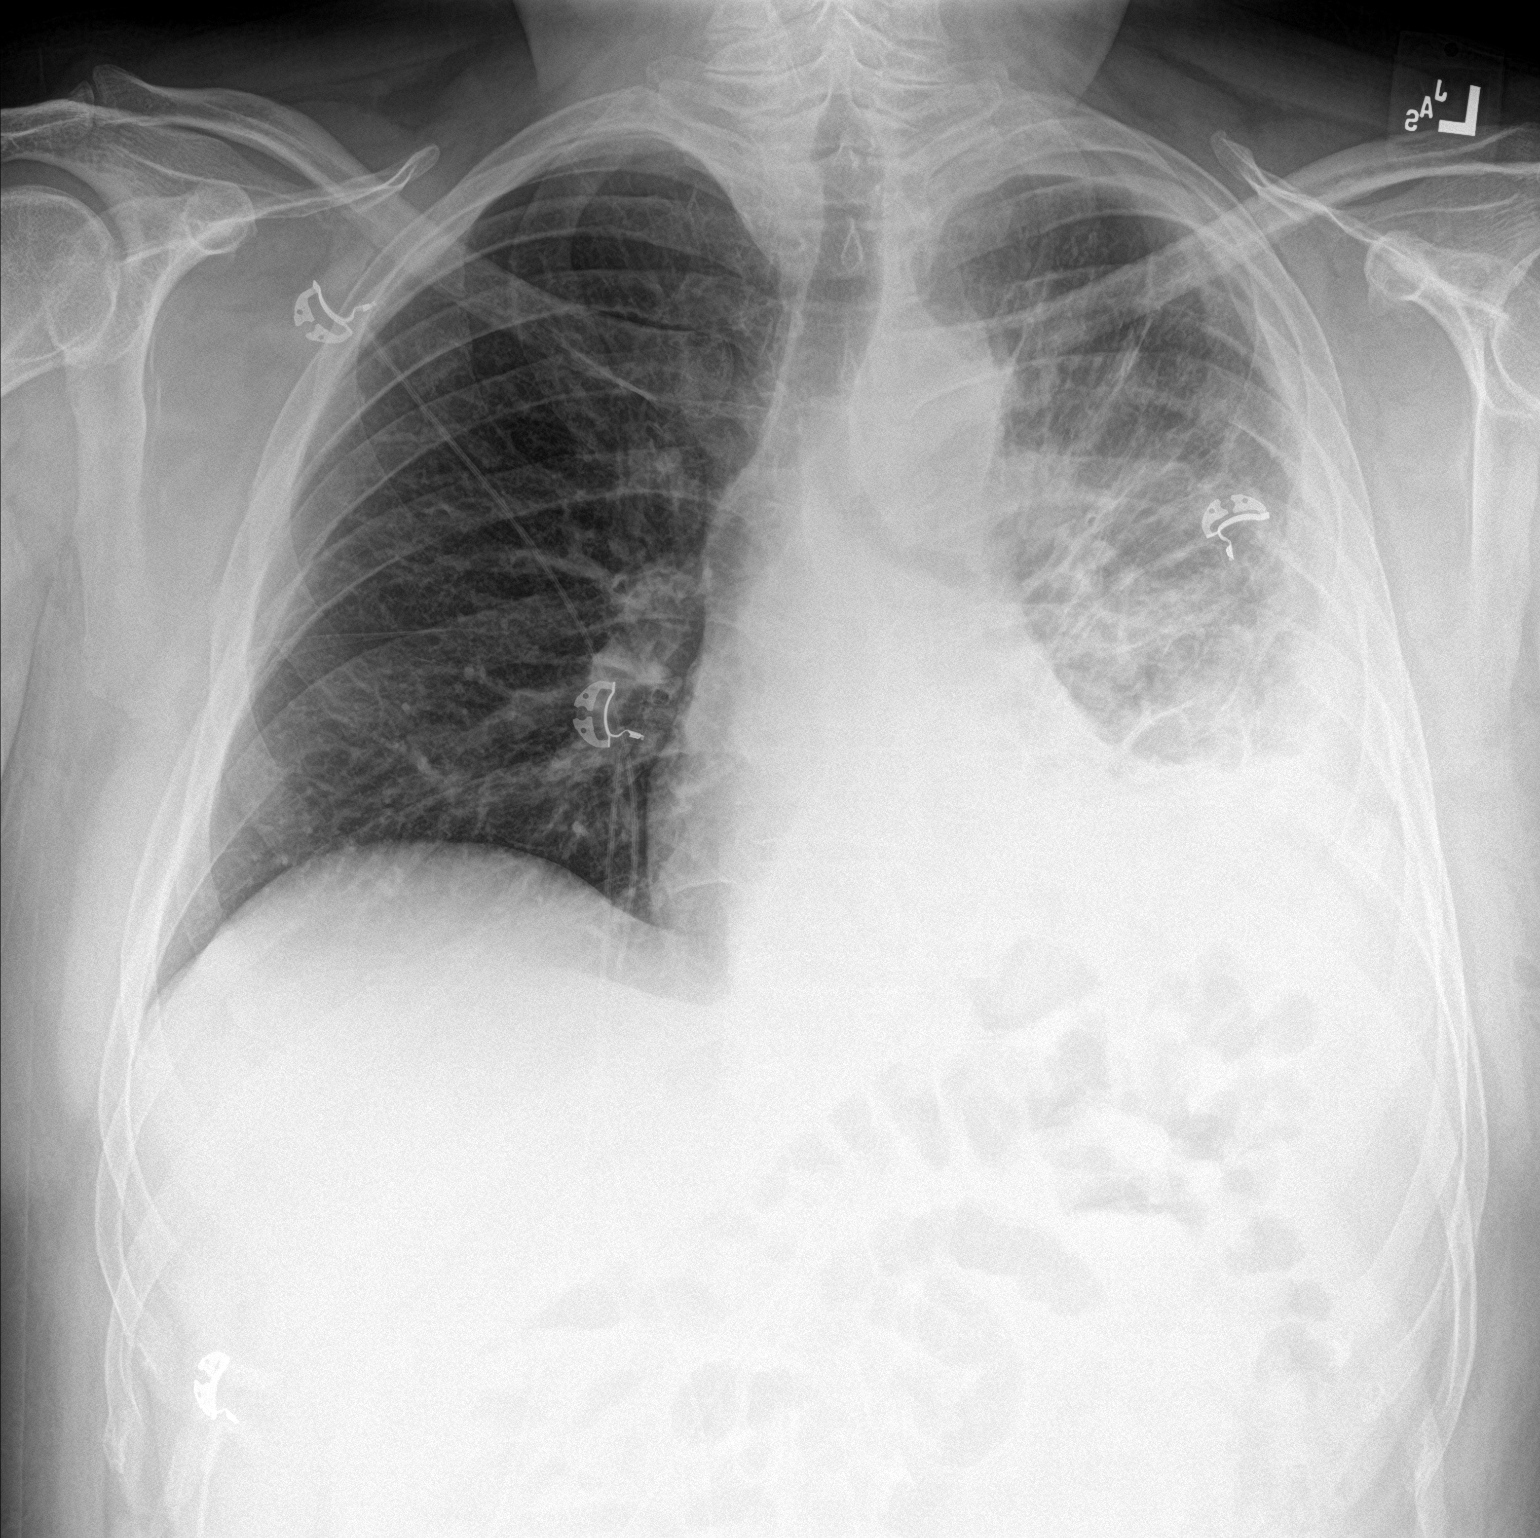

[chest lat]
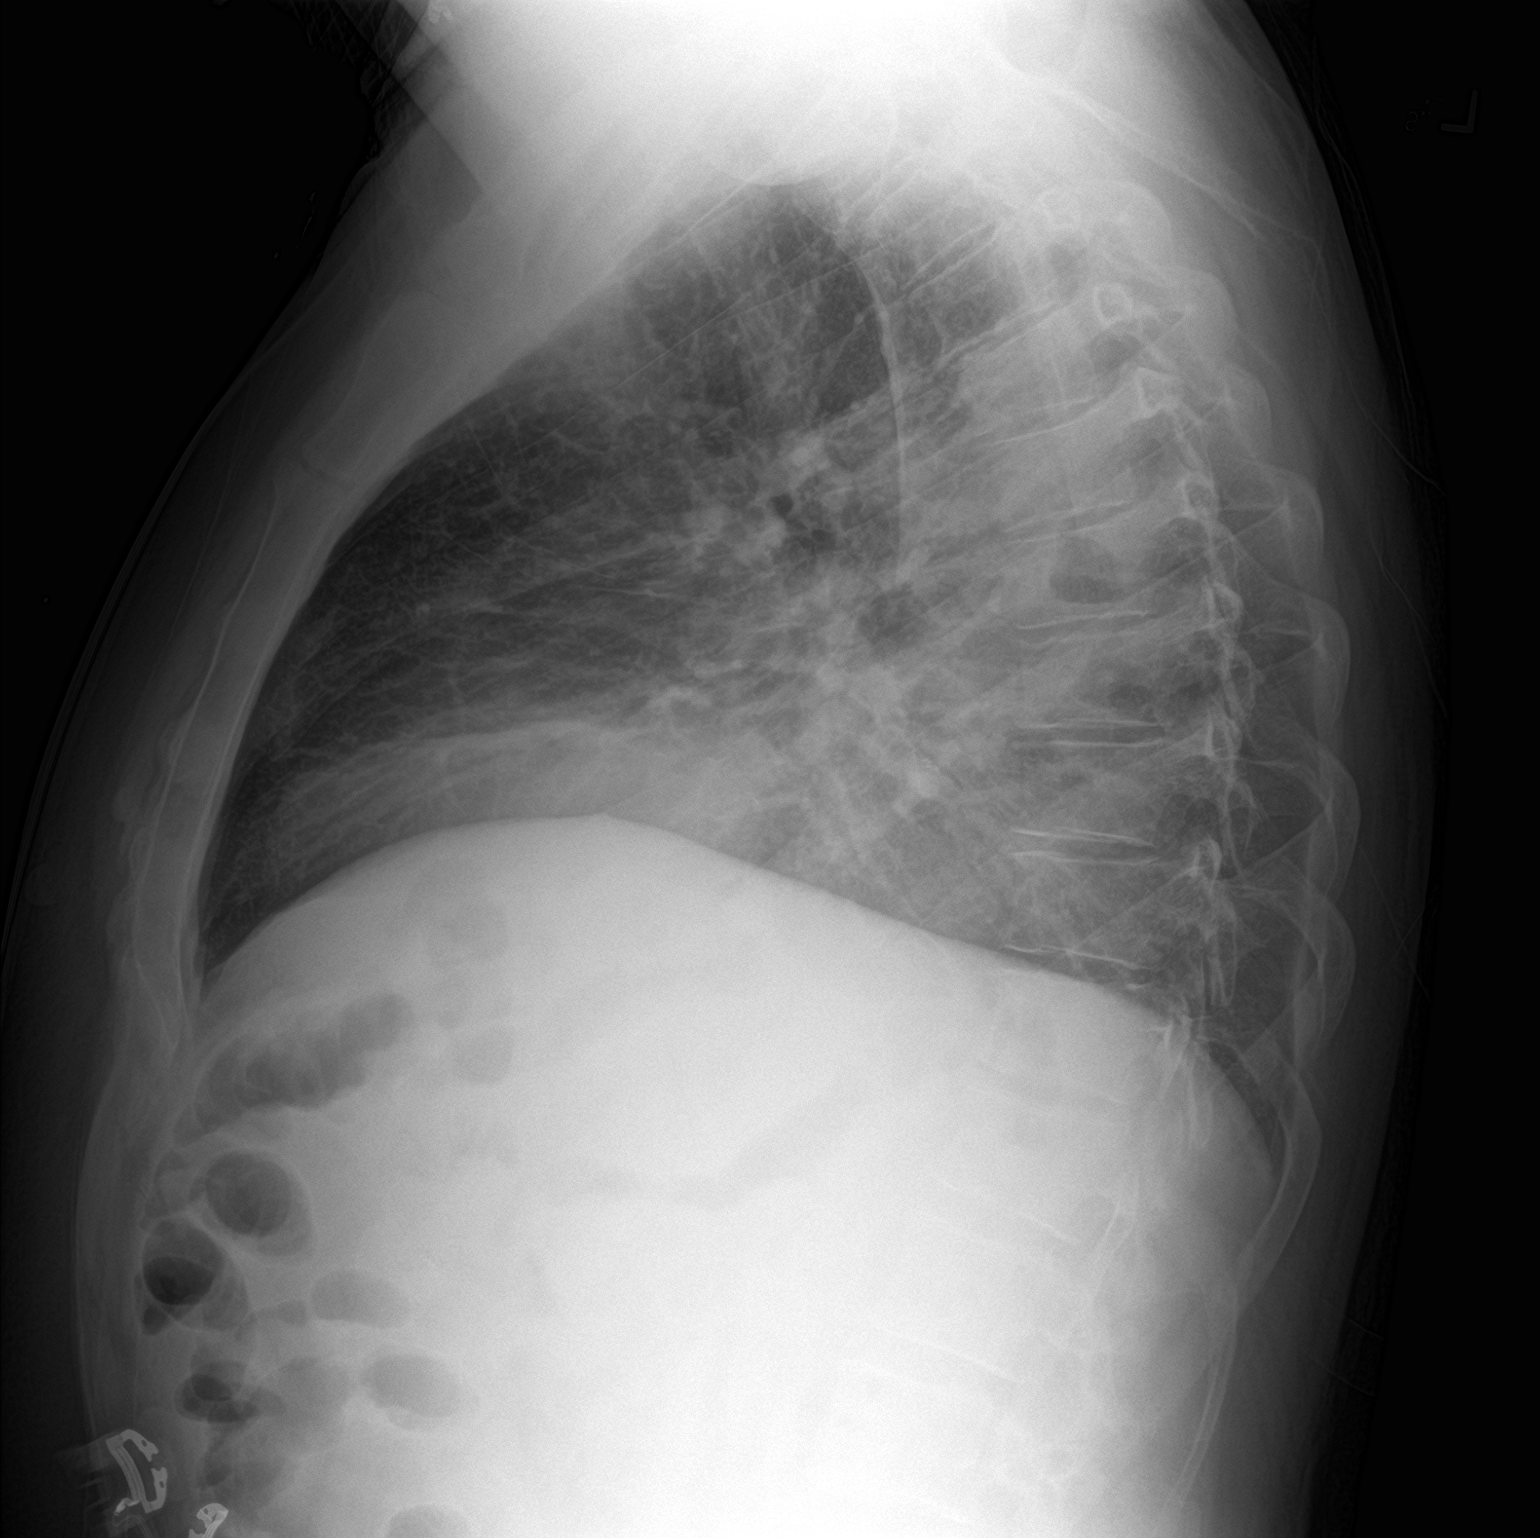

[2 of 2 positions shown; findings below may reference images not displayed]

FINDINGS: Increased interstitial markings in the left upper lobe. Left lower
lobe opacity, better evaluated on CT chest. Small left pleural
effusion.

Right lung is clear.  No pneumothorax.

The heart is normal in size.
IMPRESSION: Left lung opacities, better evaluated on recent CT chest. Small left
pleural effusion.

No pneumothorax.
